# Patient Record
Sex: Female | Born: 1965 | Race: White | Hispanic: No | Marital: Married | Smoking: Never smoker
Health system: Southern US, Community
[De-identification: ages and names within clinical notes are randomized; demographics above are authoritative.]

## PROBLEM LIST (undated history)

## (undated) DIAGNOSIS — F322 Major depressive disorder, single episode, severe without psychotic features: Secondary | ICD-10-CM

## (undated) DIAGNOSIS — F429 Obsessive-compulsive disorder, unspecified: Secondary | ICD-10-CM

## (undated) DIAGNOSIS — R05 Cough: Secondary | ICD-10-CM

## (undated) DIAGNOSIS — F411 Generalized anxiety disorder: Secondary | ICD-10-CM

## (undated) DIAGNOSIS — N390 Urinary tract infection, site not specified: Secondary | ICD-10-CM

## (undated) HISTORY — DX: Urinary tract infection, site not specified: N39.0

## (undated) HISTORY — DX: Major depressive disorder, single episode, severe without psychotic features: F32.2

## (undated) HISTORY — PX: CYSTOSCOPY: SUR368

## (undated) HISTORY — DX: Cough: R05

## (undated) HISTORY — DX: Obsessive-compulsive disorder, unspecified: F42.9

## (undated) HISTORY — DX: Generalized anxiety disorder: F41.1

---
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
## 1996-03-27 ENCOUNTER — Encounter: Payer: Self-pay | Admitting: Internal Medicine

## 2004-07-23 LAB — HM MAMMOGRAPHY: HM Mammogram: NEGATIVE

## 2007-07-17 ENCOUNTER — Emergency Department (HOSPITAL_COMMUNITY): Admission: EM | Admit: 2007-07-17 | Discharge: 2007-07-17 | Payer: Self-pay | Admitting: Emergency Medicine

## 2007-08-22 ENCOUNTER — Ambulatory Visit: Payer: Self-pay | Admitting: Internal Medicine

## 2007-08-22 ENCOUNTER — Encounter: Payer: Self-pay | Admitting: Physician Assistant

## 2007-08-22 ENCOUNTER — Telehealth: Payer: Self-pay | Admitting: Internal Medicine

## 2007-08-22 DIAGNOSIS — N390 Urinary tract infection, site not specified: Secondary | ICD-10-CM

## 2007-08-22 LAB — CONVERTED CEMR LAB
Crystals: NEGATIVE
Ketones, ur: NEGATIVE mg/dL
Nitrite: NEGATIVE
Specific Gravity, Urine: 1.015 (ref 1.000–1.03)
Total Protein, Urine: NEGATIVE mg/dL
Urine Glucose: NEGATIVE mg/dL

## 2007-09-09 ENCOUNTER — Telehealth: Payer: Self-pay | Admitting: Internal Medicine

## 2007-09-23 ENCOUNTER — Telehealth: Payer: Self-pay | Admitting: Internal Medicine

## 2007-09-23 ENCOUNTER — Ambulatory Visit: Payer: Self-pay | Admitting: Internal Medicine

## 2007-09-25 ENCOUNTER — Encounter: Payer: Self-pay | Admitting: Internal Medicine

## 2007-11-15 ENCOUNTER — Telehealth: Payer: Self-pay | Admitting: Internal Medicine

## 2007-11-21 ENCOUNTER — Telehealth: Payer: Self-pay | Admitting: Internal Medicine

## 2007-11-22 ENCOUNTER — Ambulatory Visit: Payer: Self-pay | Admitting: Internal Medicine

## 2007-11-22 ENCOUNTER — Encounter (INDEPENDENT_AMBULATORY_CARE_PROVIDER_SITE_OTHER): Payer: Self-pay | Admitting: *Deleted

## 2007-11-22 ENCOUNTER — Observation Stay (HOSPITAL_COMMUNITY): Admission: AD | Admit: 2007-11-22 | Discharge: 2007-11-23 | Payer: Self-pay | Admitting: Internal Medicine

## 2007-11-22 DIAGNOSIS — N10 Acute tubulo-interstitial nephritis: Secondary | ICD-10-CM

## 2007-11-24 ENCOUNTER — Telehealth: Payer: Self-pay | Admitting: Internal Medicine

## 2007-12-13 ENCOUNTER — Ambulatory Visit: Payer: Self-pay | Admitting: Internal Medicine

## 2007-12-13 DIAGNOSIS — N3030 Trigonitis without hematuria: Secondary | ICD-10-CM

## 2007-12-13 DIAGNOSIS — F329 Major depressive disorder, single episode, unspecified: Secondary | ICD-10-CM

## 2008-06-28 ENCOUNTER — Ambulatory Visit: Payer: Self-pay | Admitting: Internal Medicine

## 2008-06-28 DIAGNOSIS — R229 Localized swelling, mass and lump, unspecified: Secondary | ICD-10-CM | POA: Insufficient documentation

## 2008-09-17 ENCOUNTER — Ambulatory Visit: Payer: Self-pay | Admitting: Internal Medicine

## 2008-09-17 DIAGNOSIS — M542 Cervicalgia: Secondary | ICD-10-CM | POA: Insufficient documentation

## 2008-10-26 ENCOUNTER — Emergency Department (HOSPITAL_COMMUNITY): Admission: EM | Admit: 2008-10-26 | Discharge: 2008-10-27 | Payer: Self-pay | Admitting: Emergency Medicine

## 2008-10-27 ENCOUNTER — Encounter (INDEPENDENT_AMBULATORY_CARE_PROVIDER_SITE_OTHER): Payer: Self-pay | Admitting: *Deleted

## 2008-12-20 ENCOUNTER — Ambulatory Visit: Payer: Self-pay | Admitting: Internal Medicine

## 2009-03-08 ENCOUNTER — Ambulatory Visit (HOSPITAL_COMMUNITY): Admission: RE | Admit: 2009-03-08 | Discharge: 2009-03-08 | Payer: Self-pay | Admitting: Orthopedic Surgery

## 2009-06-28 ENCOUNTER — Telehealth: Payer: Self-pay | Admitting: Internal Medicine

## 2010-01-30 ENCOUNTER — Telehealth: Payer: Self-pay | Admitting: Gastroenterology

## 2010-02-27 ENCOUNTER — Encounter: Payer: Self-pay | Admitting: Internal Medicine

## 2010-02-27 ENCOUNTER — Ambulatory Visit
Admission: RE | Admit: 2010-02-27 | Discharge: 2010-02-27 | Payer: Self-pay | Source: Home / Self Care | Attending: Internal Medicine | Admitting: Internal Medicine

## 2010-02-27 DIAGNOSIS — R1032 Left lower quadrant pain: Secondary | ICD-10-CM | POA: Insufficient documentation

## 2010-02-28 ENCOUNTER — Encounter: Payer: Self-pay | Admitting: Internal Medicine

## 2010-02-28 ENCOUNTER — Ambulatory Visit
Admission: RE | Admit: 2010-02-28 | Discharge: 2010-02-28 | Payer: Self-pay | Source: Home / Self Care | Attending: Internal Medicine | Admitting: Internal Medicine

## 2010-03-07 ENCOUNTER — Encounter: Payer: Self-pay | Admitting: Internal Medicine

## 2010-03-10 ENCOUNTER — Telehealth: Payer: Self-pay | Admitting: Internal Medicine

## 2010-03-18 NOTE — Progress Notes (Signed)
Summary: Diazepam refill  Phone Note Refill Request Message from:  Fax from Pharmacy on Jun 28, 2009 4:28 PM  Refills Requested: Medication #1:  DIAZEPAM 2 MG TABS take 1 or up to 3 by mouth q6 as needed muscle spasm neck   Notes: [facility] [address] Initial call taken by: Lucious Groves,  Jun 28, 2009 4:29 PM  Follow-up for Phone Call        OK to refill x 2. (3 total) Follow-up by: Jacques Navy MD,  Jul 01, 2009 5:02 AM    Prescriptions: DIAZEPAM 2 MG TABS (DIAZEPAM) take 1 or up to 3 by mouth q6 as needed muscle spasm neck  #30 x 2   Entered by:   Bill Salinas CMA   Authorized by:   Jacques Navy MD   Signed by:   Bill Salinas CMA on 07/01/2009   Method used:   Telephoned to ...       [facility] [address] [phone]* (retail)       [address]       [place], [place]  [address]       Ph: [phone] or [phone]       Fax: [phone]   RxID:   [id number]

## 2010-03-20 NOTE — Assessment & Plan Note (Signed)
Summary: per Dr Sherren Kerns   History of Present Illness Visit Type: Initial Visit Primary GI MD: Lina Sar MD Primary Provider: Illene Regulus, MD Requesting Provider: n/a Chief Complaint: Acute constipation and periumbilical abdominal discomfort x 1 month. 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 The pain is localized anteriorly. It is not been dependent on bowel movements or eating. The pain appears shortly after she gets up in the morning. It almost feels like an obstruction. She has taken laxatives but does have remaining tenderness in the left lower quadrant. She was seen around September 2010 at [facility] emergency room and a CT scan of the abdomen at that time showed mild stranding in the left lower quadrant around the sigmoid, but no evidence of diverticulitis. There was also questionable heterogeneous uterus and left adnexal stricture or reflecting likely ovarian cyst or small hydrosalpinx. There was no adenopathy. The rest of the abdomen and pelvis was normal. Her hemoglobin at that time was 12.5 with a normal white cell count.   GI Review of Systems    Reports abdominal pain.     Location of  Abdominal pain: periumbilical.    Denies acid reflux, belching, bloating, chest pain, dysphagia with liquids, dysphagia with solids, heartburn, loss of appetite, nausea, vomiting, vomiting blood, weight loss, and  weight gain.      Reports change in bowel habits, constipation, and  rectal pain.     Denies anal fissure, black tarry stools, diarrhea, diverticulosis, fecal incontinence, heme positive stool, hemorrhoids, irritable bowel syndrome, jaundice, light color stool, liver problems, and  rectal bleeding. Preventive Screening-Counseling & Management  Alcohol-Tobacco     Smoking Status: never  Caffeine-Diet-Exercise     Does Patient  Exercise: no      Drug Use:  no.      Current Medications (verified): 1)  Pristiq 50 Mg Xr24h-Tab (Desvenlafaxine Succinate) .... Take 1 By Mouth Qd 2)  Meloxicam 15 Mg Tabs (Meloxicam) .Marland Kitchen.. 1 By Mouth Once Daily As Needed 3)  Lamictal 100 Mg Tabs (Lamotrigine) .... Take 1/2-1 Tablet Daily As Needed  Allergies (verified): 1)  ! * Chlorhexidine  Past History:  Past Medical History: Reviewed history from 12/13/2007 and no changes required. 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 cystosocopy with uretheral stretches.    G2Ps-NSVD  Family History: father- '45: CAD/PCI w/ stents, lipids mother - '45: Lipids, a. 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 no abuse history Patient has never smoked.  Alcohol Use - yes Illicit Drug Use - no Patient does not get regular exercise.  Does Patient Exercise:  no  Review of Systems  The patient denies allergy/sinus, anemia, anxiety-new, arthritis/joint pain, back pain, blood in urine, breast changes/lumps, change in vision, confusion, cough, coughing up blood, depression-new, fainting, fatigue, fever, headaches-new, hearing problems, heart murmur, heart rhythm changes, itching, menstrual pain, muscle pains/cramps, night sweats, nosebleeds, pregnancy symptoms, shortness of  breath, skin rash, sleeping problems, sore throat, swelling of feet/legs, swollen lymph glands, thirst - excessive , urination - excessive , urination changes/pain, urine  leakage, vision changes, and voice change.         Pertinent positive and negative review of systems were noted in the above HPI. All other ROS was otherwise negative.   Vital Signs:  Patient profile:   45 year old female Height:      65 inches Weight:      126.38 pounds BMI:     21.11 BSA:     1.63 Pulse rate:   84 / minute Pulse rhythm:   regular BP sitting:   108 / 88  (right arm) Cuff size:   regular  Vitals Entered By: Lamona Curl CMA Duncan Dull) (February 27, 2010 11:54 AM)  Physical Exam  General:  Well developed, well nourished, no acute distress. Lungs:  Clear throughout to auscultation. Heart:  Regular rate and rhythm; no murmurs, rubs,  or bruits. Abdomen:  Soft  relaxed abdomen with normal active bowel sounds. Palpable tender fullness in the left middle quadrants without rebound. It could be moved  around,it is not attached to the abdominal wall. Right lower quadrant and middle quadrants were unremarkable. Rectal:  soft Hemoccult negative stool. Extremities:  No clubbing, cyanosis, edema or deformities noted. Skin:  Intact without significant lesions or rashes. Psych:  Alert and cooperative. Normal mood and affect.   Impression & Recommendations:  Problem # 1:  ABDOMINAL PAIN, LEFT LOWER QUADRANT (ICD-789.04)  Patient has left lower quadrant abdominal discomfort associated with abnormal physical findings and questionable abnormality on a prior CT scan from September 2010. I suspect an inflammatory process in the left lower quadrant either arising from the colon or from pelvic structures. She has no history of endometriosis and there are no systemic symptoms of fever, weight loss or decreased energy.  There is no occult blood in the stool although her recent hemoglobin according to her was around 11 gms. We have discussed evaluation of this mass and the possiblity of repeating a CT scan versus barium enema versus flexible sigmoidoscopy. I think the most efficient choice  would be direct visualization by flexible sigmoidoscopy. It should be scheduled as soon as possible. She did decline pain medications. Depending on the results of the sigmoidoscopy, we will make further disposition such as use of antibiotics or further radiographic evaluation.  Orders: Flex with Sedation (Flex w/Sed)  Patient Instructions: 1)  You have been scheduled for a flexible sigmoidoscopy with sedation on 02/28/10 @ 10:30 am. 2)  The medication list was reviewed and reconciled.  All changed / newly prescribed medications were explained.  A complete medication list was provided to the patient / caregiver.

## 2010-03-20 NOTE — Letter (Signed)
Summary: [facility]  [address] [place], [place] [address]   Phone: [phone]  Fax: [phone]       RANAE CASEBIER    1965/11/05    MRN: [id number]        Procedure Day /Date: 02/28/10 Friday     Arrival Time: 9:30 am     Procedure Time: 10:30 am     Location of Procedure:                    _ x_  [facility] (4th Floor)  PREPARATION FOR FLEXIBLE SIGMOIDOSCOPY WITH MAGNESIUM CITRATE  Prior to the day before your procedure, purchase one 8 oz. bottle of Magnesium Citrate and one Fleet Enema from the laxative section of your drugstore.  _________________________________________________________________________________________________  THE DAY BEFORE YOUR PROCEDURE             DATE: 02/27/10     DAY: Thursday  1.   Have a clear liquid dinner the night before your procedure.  2.   Do not drink anything colored red or purple.  Avoid juices with pulp.  No orange juice.              CLEAR LIQUIDS INCLUDE: Water Jello Ice Popsicles Tea (sugar ok, no milk/cream) Powdered fruit flavored drinks Coffee (sugar ok, no milk/cream) Gatorade Juice: apple, white grape, white cranberry  Lemonade Clear bullion, consomm, broth Carbonated beverages (any kind) Strained chicken noodle soup Hard Candy   3.   At 7:00 pm the night before your procedure, drink one bottle of Magnesium Citrate over ice.  4.   Drink at least 3 more glasses of clear liquids before bedtime (preferably juices).  5.   Results are expected usually within 1 to 6 hours after taking the Magnesium Citrate.  ___________________________________________________________________________________________________  THE DAY OF YOUR PROCEDURE            DATE: 02/28/10     DAY: Friday  1.   Use Fleet Enema one hour prior to coming for procedure.  2.   You may drink clear liquids until 8:30 am (2 hours before exam)       MEDICATION INSTRUCTIONS  Unless otherwise  instructed, you should take regular prescription medications with a small sip of water as early as possible the morning of your procedure.        OTHER INSTRUCTIONS  You will need a responsible adult at least 43 years of age to accompany you and drive you home.   This person must remain in the waiting room during your procedure.  Wear loose fitting clothing that is easily removed.  Leave jewelry and other valuables at home.  However, you may wish to bring a book to read or an iPod/MP3 player to listen to music as you wait for your procedure to start.  Remove all body piercing jewelry and leave at home.  Total time from sign-in until discharge is approximately 2-3 hours.  You should go home directly after your procedure and rest.  You can resume normal activities the day after your procedure.  The day of your procedure you should not:   Drive   Make legal decisions   Operate machinery   Drink alcohol   Return to work  You will receive specific instructions about eating, activities and medications before you leave.   The above instructions have been reviewed and explained to me by   _______________________    I fully understand and  can verbalize these instructions _____________________________ Date _________

## 2010-03-20 NOTE — Consult Note (Signed)
Summary: Cystitis  NAME:  Amanda Alvarez, Amanda Alvarez              ACCOUNT NO.:  [id number]      MEDICAL RECORD NO.:  [id number]          PATIENT TYPE:  OBV      LOCATION:  1501                         FACILITY:  [facility]      PHYSICIAN:  Bertram Millard. Dahlstedt, M.D.DATE OF BIRTH:  [birth year]      DATE OF CONSULTATION:  11/22/2007   DATE OF DISCHARGE:                                    CONSULTATION      REASON FOR CONSULTATION:  Painful cystitis.      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  She has a long-standing urologic history,   including urinary tract infections as a young girl.  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  Additionally, 20 years ago, she   had a couple of cystoscopic procedures including urethral dilatation and   what sounds like urethral incision.  She read the operative reports, and   states that she was found to have inflammation of the bladder and   trigonitis.      Over the past few years, she has had a continued burning with urination.   Additionally, she has significant pelvic discomfort after intercourse,   usually 2-3 days after.  She has occasional urinary tract infections as   well.  This feels different, like a brick on my bladder.  Yesterday   she began having pain, that similar sensation of pelvic pressure, about   10 a.m.  She additionally had worsened frequency and dysuria.  She had a   fever to 100.  She denies any upper abdominal or back pain.      She is admitted for medical therapy.  Urologic consultation is urgently   requested.      PAST MEDICAL HISTORY:   1. Significant for recurrent urinary tract infections.   2. Depression.   3. She has had prior cystoscopy x2.      MEDICATIONS:  Triphasil and Paxil.      ALLERGIES:  No known drug allergies.      SOCIAL HISTORY:  She is married.  She is a  nonsmoker.  She drinks   alcohol occasionally.      FAMILY HISTORY:  Significant for kidney stones and coronary artery   disease.      REVIEW OF SYSTEMS:  Significant for the previously mentioned   pelvic/urinary symptoms.  She denies fever, night sweats, or weight   loss.  No skin changes, no change in vision, no sore throat or sinus   problems.  No chest pain, peripheral edema, cough, shortness of breath.   No excessive thirst, no back or joint pain.  Mild depression.  No   headache or dizziness.  No change in lower GI system, no heartburn or   indigestion.      PHYSICAL EXAMINATION:  GENERAL:  Pleasant adult female.  She was   sleeping in her room.   VITAL SIGNS:  Blood pressure 111/67, pulse 83, respiratory rate 20,   temperature 98.3.   NECK:  Supple.   HEART:  Normal rate and rhythm.  No rubs, gallops, or murmurs.   LUNGS:  Clear bilaterally.   ABDOMEN:  Flat, soft, nontender.  Mild suprapubic tenderness.  There is   no CVA tenderness, mass, or megaly.   GENITALIA:  Vaginal exam normal - no labial edema.  Urethral meatus   normal.  No discharge.   EXTREMITIES:  Without cyanosis, clubbing, or edema.      LABORATORY DATA:  Laboratories were reviewed - basically all normal with   a white count of 5500.  Pregnancy test negative.  Urinalysis nitrite   positive, trace LE, 7-10 red cells, 0-2 white cells.  Renal ultrasound   negative.      IMPRESSION:   1. Possible acute cystitis.  She is being treated appropriately.   2. Pelvic pain/bladder pain.  Perhaps she has interstitial cystitis.      PLAN:   1. I would feel comfortable switching her to oral antibiotics - she       has received 2 g of Rocephin.  I think that would cover her       initially.   2. Would treat pelvic/bladder pain with narcotics in the short term.   3. I think that this lady could well be treated as an outpatient, and       I would recommend follow-up in my office in the near future.       Perhaps we can  discuss environmental issues which may be       influencing her symptoms.               Bertram Millard. Dahlstedt, M.D.   Electronically Signed            SMD/MEDQ  D:  11/22/2007  T:  11/22/2007  Job:  [id number]      cc:   Rosalyn Gess. Norins, MD   [address]   [place]   [place] [address]

## 2010-03-20 NOTE — Progress Notes (Signed)
Summary: Call Report  Phone Note Other Incoming   Caller: [facility] Summary of Call: [facility] Triage Call Report Triage Record Num: [id number] Operator: Alphonsa Overall Patient Name: Amanda Alvarez Call Date & Time: 03/07/2010 8:59:52PM Patient Phone: [phone] PCP: Illene Regulus Patient Gender: Female PCP Fax : [phone] Patient DOB: 1965-08-31 Practice Name: [facility] Reason for Call: Gunnar Fusi calling about script from [facility] 03/07/10 at 1800. Mupirocin ointment not at pharmacy. [facility] [employer] [phone]. Dr Fabian Sharp read Mupirocin ointment 2% apply to left nostril BID for infection 10gm per Dr Debby Bud order. Called into [facility] and [place] aware. Protocol(s) Used: Medication Question Calls, No Triage (Adults) Recommended Outcome per Protocol: Call Provider Immediately Reason for Outcome: [1] Urgent new prescription or refill (likelihood of harm to patient if not taken) AND [2] triager unable to fill per unit policy Care Advice:  ~ 01/ Initial call taken by: Margaret Pyle, CMA,  March 10, 2010 8:03 AM  Follow-up for Phone Call        k

## 2010-03-20 NOTE — Procedures (Signed)
Summary: Flexible Sigmoidoscopy  Patient: Amanda Alvarez Note: All result statuses are Final unless otherwise noted.  Tests: (1) Flexible Sigmoidoscopy (FLX)  FLX Flexible Sigmoidoscopy                             DONE     [facility]     [address]     [place], [place]  [address]           FLEXIBLE SIGMOIDOSCOPY PROCEDURE REPORT           PATIENT:  Amanda Alvarez, Amanda Alvarez  MR#:  [id number]     BIRTHDATE:  09/03/1965, 44 yrs. old  GENDER:  female           ENDOSCOPIST:  Hedwig Morton. Juanda Chance, MD     Referred by:           PROCEDURE DATE:  02/28/2010     PROCEDURE:  Flexible Sigmoidoscopy, diagnostic     ASA CLASS:  Class I     INDICATIONS:  abdominal pain LLQ abd, pain, "mass" on exam,     abnormal CT scan 10/2008,           MEDICATIONS:   Versed 5 mg, Fentanyl 50 mcg           DESCRIPTION OF PROCEDURE:   After the risks benefits and     alternatives of the procedure were thoroughly explained, informed     consent was obtained.  Digital rectal exam was performed and     revealed no rectal masses.   The [id number] endoscope     was introduced through the anus and advanced to the splenic     flexure, without limitations.  The quality of the prep was     excellent.  The instrument was then slowly withdrawn as the mucosa     was fully examined.     <<PROCEDUREIMAGES>>           The area of colon examined was normal in appearance (see image1,     image2, image3, image4, and image5). no evidence of stricture,     normal appearing colon mucosa, no diverticuli, lumen wide open to     splenic flexure, no evidence of extrinsic compression     Retroflexed views in the rectum revealed small hemorrhoids.    The     scope was then withdrawn from the patient and the procedure     terminated.           COMPLICATIONS:  None           ENDOSCOPIC IMPRESSION:     1) Normal colon     2) Small hemorrhoids     exam to 90 cm ( splenic flexure),     on reexamination prior to the  exam thr "mass" in LLQ was gone.     RECOMMENDATIONS:     constipation? would add a stool softener or fiber supplement to     improve transit time, no further GI eval at this time,           REPEAT EXAM:  In 0 year(s) for.           ______________________________     Hedwig Morton. Juanda Chance, MD           CC:           n.     eSIGNED:   Hedwig Morton. Jaecob Lowden at 02/28/2010 11:39 AM  Amanda Alvarez, Amanda Alvarez, [id number]  Note: An exclamation mark (!) indicates a result that was not dispersed into the flowsheet. Document Creation Date: 02/28/2010 11:39 AM _______________________________________________________________________  (1) Order result status: Final Collection or observation date-time: 02/28/2010 11:25 Requested date-time:  Receipt date-time:  Reported date-time:  Referring Physician:   Ordering Physician: Lina Sar [phone]) Specimen Source:  Source: Launa Grill Order Number: [id number] Lab site:

## 2010-03-20 NOTE — Assessment & Plan Note (Signed)
   Primary Care Provider:  Illene Regulus, MD   History of Present Illness: Amanda Alvarez is seen acutely for a sore nose. She reports that over the past 24-36 hrs she has developed some swelling at the left side of her nose alng with mild erythema and marked tenderness. She has had no fever.She has had no injury. She has not had any URI symptoms: no coryza, rhinorrhea, pharyngitis.  Allergies: 1)  ! * Chlorhexidine PMH-FH-SH reviewed-no changes except otherwise noted  Review of Systems  The patient denies fever, weight loss, hoarseness, and dyspnea on exertion.         no epistaxis, no rhinorrhea  Physical Exam  General:  Well-developed,well-nourished,in no acute distress; alert,appropriate and cooperative throughout examination Nose:  left side of nose is pinkish. Nares left- very red mucosa, no specific lesion but exam is limited.   Impression & Recommendations:  Problem # 1:  OTHER DISEASES OF NASAL CAVITY AND SINUSES (ICD-478.19)  based on history and exam suspect infection of the lateral wall of the nostril.  Plan - mupirocin ointment 2% - apply into nares with q-tip two times a day           warm compresses.  Orders: No Charge Patient Arrived (NCPA0) (NCPA0)  Complete Medication List: 1)  Pristiq 50 Mg Xr24h-tab (Desvenlafaxine succinate) .... Take 1 by mouth qd 2)  Meloxicam 15 Mg Tabs (Meloxicam) .Marland Kitchen.. 1 by mouth once daily as needed 3)  Lamictal 100 Mg Tabs (Lamotrigine) .... Take 1/2-1 tablet daily as needed 4)  Mupirocin 2 % Oint (Mupirocin) .Marland Kitchen.. 1 apply to left nares two times a day for infection Prescriptions: MUPIROCIN 2 % OINT (MUPIROCIN) 1 apply to left nares two times a day for infection  #10 g x 1   Entered and Authorized by:   Jacques Navy MD   Signed by:   Jacques Navy MD on 03/08/2010   Method used:   Electronically to        [facility] # [phone]* (retail)       [address]       [place], [place]  [address]       Ph: [phone]    Fax: [phone]   RxID:   [id number]    Orders Added: 1)  No Charge Patient Arrived (NCPA0) [NCPA0]

## 2010-03-20 NOTE — Progress Notes (Signed)
  Phone Note Outgoing Call   Summary of Call: Chiniqua was xmas shopping last night, started to get pruritic. This am clearly has hives (blotchy, itchy) on arms, trunk back.  I had her take one 25mg  benedryl.  I spoke with her PCP, Dr. Debby Bud and he gave advise on tapering steroids which I will call in for her. Initial call taken by: Rachael Fee MD,  January 30, 2010 10:00 AM    New/Updated Medications: PREDNISONE 10 MG  TABS (PREDNISONE) take 4 pills today, then 3 a day for three days, then 2 a day for 2 days, then 1 a day for 6 days Prescriptions: PREDNISONE 10 MG  TABS (PREDNISONE) take 4 pills today, then 3 a day for three days, then 2 a day for 2 days, then 1 a day for 6 days  #30 x 1   Entered and Authorized by:   Rachael Fee MD   Signed by:   Rachael Fee MD on 01/30/2010   Method used:   Electronically to        [facility] # [phone]* (retail)       [address]       [place], [place]  [address]       Ph: [phone]       Fax: [phone]   RxID:   [id number]   Appended Document:  thank you Kathlene November

## 2010-05-02 ENCOUNTER — Encounter: Payer: Self-pay | Admitting: Internal Medicine

## 2010-05-06 NOTE — Miscellaneous (Signed)
Summary: Zofran Rx  Per Mike Gip, PA-C, patient needs zofran prescription. prescription sent. Dottie Nelson-Smith CMA Duncan Dull)  May 02, 2010 12:16 PM  Clinical Lists Changes  Medications: Added new medication of ONDANSETRON HCL 4 MG TABS (ONDANSETRON HCL) Take 1 tablet by mouth every 6 hours as needed for nausea - Signed Rx of ONDANSETRON HCL 4 MG TABS (ONDANSETRON HCL) Take 1 tablet by mouth every 6 hours as needed for nausea;  #25 x 0;  Signed;  Entered by: Lamona Curl CMA (AAMA);  Authorized by: Hart Carwin MD;  Method used: Electronically to [facility] # [phone]*, [address] [place], [place]  [address], Ph: [phone], Fax: [phone]    Prescriptions: ONDANSETRON HCL 4 MG TABS (ONDANSETRON HCL) Take 1 tablet by mouth every 6 hours as needed for nausea  #25 x 0   Entered by:   Lamona Curl CMA (AAMA)   Authorized by:   Hart Carwin MD   Signed by:   Lamona Curl CMA (AAMA) on 05/02/2010   Method used:   Electronically to        [facility] # [phone]* (retail)       [address]       [place], [place]  [address]       Ph: [phone]       Fax: [phone]   RxID:   [id number]

## 2010-05-23 LAB — COMPREHENSIVE METABOLIC PANEL
AST: 16 U/L (ref 0–37)
Albumin: 3.9 g/dL (ref 3.5–5.2)
BUN: 7 mg/dL (ref 6–23)
Calcium: 9 mg/dL (ref 8.4–10.5)
Creatinine, Ser: 0.7 mg/dL (ref 0.4–1.2)
Total Protein: 7.1 g/dL (ref 6.0–8.3)

## 2010-05-23 LAB — URINALYSIS, ROUTINE W REFLEX MICROSCOPIC
Bilirubin Urine: NEGATIVE
Ketones, ur: NEGATIVE mg/dL
Nitrite: NEGATIVE
Protein, ur: NEGATIVE mg/dL
Specific Gravity, Urine: 1.023 (ref 1.005–1.030)
Urobilinogen, UA: 0.2 mg/dL (ref 0.0–1.0)

## 2010-05-23 LAB — DIFFERENTIAL
Basophils Absolute: 0 10*3/uL (ref 0.0–0.1)
Lymphocytes Relative: 29 % (ref 12–46)
Lymphs Abs: 2.1 10*3/uL (ref 0.7–4.0)
Neutro Abs: 4.4 10*3/uL (ref 1.7–7.7)

## 2010-05-23 LAB — URINE MICROSCOPIC-ADD ON

## 2010-05-23 LAB — CBC
HCT: 36.5 % (ref 36.0–46.0)
Hemoglobin: 12.5 g/dL (ref 12.0–15.0)
Platelets: 252 10*3/uL (ref 150–400)
RBC: 4.02 MIL/uL (ref 3.87–5.11)
RDW: 12.9 % (ref 11.5–15.5)
WBC: 7.3 10*3/uL (ref 4.0–10.5)

## 2010-05-30 ENCOUNTER — Telehealth: Payer: Self-pay | Admitting: Internal Medicine

## 2010-05-30 MED ORDER — CYCLOBENZAPRINE HCL 10 MG PO TABS: 10.0000 mg | ORAL_TABLET | Freq: Three times a day (TID) | ORAL | Status: AC | PRN

## 2010-05-30 MED ORDER — NABUMETONE 500 MG PO TABS: 500.0000 mg | ORAL_TABLET | Freq: Two times a day (BID) | ORAL | Status: DC

## 2010-05-30 NOTE — Telephone Encounter (Signed)
Pt would like a work in appt for neck injection today w/any MD or refills of anti-inflammatory & muscle relaxer and she will set up appt w/Dr Norins, either way. Please advise and let her know.

## 2010-05-30 NOTE — Telephone Encounter (Signed)
Per Dr Macario Golds. Medications called in, pt also wants appt with Dr Debby Bud next week. Pt put on for Apt June 03, 2010  5:15pm

## 2010-06-02 ENCOUNTER — Encounter: Payer: Self-pay | Admitting: *Deleted

## 2010-06-03 ENCOUNTER — Ambulatory Visit (INDEPENDENT_AMBULATORY_CARE_PROVIDER_SITE_OTHER): Payer: 59 | Admitting: Internal Medicine

## 2010-06-03 DIAGNOSIS — M546 Pain in thoracic spine: Secondary | ICD-10-CM

## 2010-06-03 DIAGNOSIS — M549 Dorsalgia, unspecified: Secondary | ICD-10-CM

## 2010-06-04 MED ORDER — METHYLPREDNISOLONE ACETATE 80 MG/ML IJ SUSP
40.0000 mg | Freq: Once | INTRAMUSCULAR | Status: AC
  Administered 2010-06-03: 40 mg via INTRAMUSCULAR

## 2010-06-04 NOTE — Patient Instructions (Signed)
n

## 2010-06-04 NOTE — Progress Notes (Signed)
  Subjective:    Patient ID: Amanda Alvarez, female    DOB: 1965/08/22, 45 y.o.   MRN: [id number]  HPI Amanda Alvarez presents for trigger point pain located medial to the right scapula - rhomboids. She has had deep pain and muscle knotting in this location in the past that has responded to trigger point injection.  PMH, FamHx and SocHx reviewed for any changes and relevance.  Review of Systems Review of Systems  Constitutional:  Negative for fever, chills, activity change and unexpected weight change.  HENT:  Negative for hearing loss, ear pain, congestion, neck stiffness and postnasal drip.   Eyes: Negative for pain, discharge and visual disturbance.  Respiratory: Negative for chest tightness and wheezing.   Cardiovascular: Negative for chest pain and palpitations.       [No decreased exercise tolerance Gastrointestinal: [No change in bowel habit. No bloating or gas. No reflux or indigestion Genitourinary: Negative for urgency, frequency, flank pain and difficulty urinating.  Musculoskeletal: Negative for myalgias, back pain, arthralgias and gait problem.  Neurological: Negative for dizziness, tremors, weakness and headaches.  Hematological: Negative for adenopathy.  Psychiatric/Behavioral: Negative for behavioral problems and dysphoric mood.       Objective:   Physical Exam WNWD white woman in no distress Neck - supple with full ROM Back - point tenderness just medial to the scapula right.  Procedure: trigger pont injection Consent - verbal informed consent Location - right back - medial to scapula Prep - betadine and alcohol Meds - .5cc 2% xylocaine with epi Inject - using #23g 1" needle Tolerated well with good results. No complications Routine precautions given.       Assessment & Plan:  1. Localized pain - muscle spasm with pain right back  Plan - trigger point injection that was successful.

## 2010-07-01 NOTE — Consult Note (Signed)
NAMEMELDA, MERMELSTEIN              ACCOUNT NO.:  [id number]   MEDICAL RECORD NO.:  [id number]          PATIENT TYPE:  OBV   LOCATION:  1501                         FACILITY:  [facility]   PHYSICIAN:  Bertram Millard. Dahlstedt, M.D.DATE OF BIRTH:  07-09-1965   DATE OF CONSULTATION:  11/22/2007  DATE OF DISCHARGE:                                 CONSULTATION   REASON FOR CONSULTATION:  Painful cystitis.   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  She has a long-standing urologic history,  including urinary tract infections as a young girl.  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  Additionally, 20 years ago, she  had a couple of cystoscopic procedures including urethral dilatation and  what sounds like urethral incision.  She read the operative reports, and  states that she was found to have inflammation of the bladder and  trigonitis.   Over the past few years, she has had a continued burning with urination.  Additionally, she has significant pelvic discomfort after intercourse,  usually 2-3 days after.  She has occasional urinary tract infections as  well.  This feels different, like a brick on my bladder.  Yesterday  she began having pain, that similar sensation of pelvic pressure, about  10 a.m.  She additionally had worsened frequency and dysuria.  She had a  fever to 100.  She denies any upper abdominal or back pain.   She is admitted for medical therapy.  Urologic consultation is urgently  requested.   PAST MEDICAL HISTORY:  1. Significant for recurrent urinary tract infections.  2. Depression.  3. She has had prior cystoscopy x2.   MEDICATIONS:  Triphasil and Paxil.   ALLERGIES:  No known drug allergies.   SOCIAL HISTORY:  She is married.  She is a nonsmoker.  She drinks  alcohol occasionally.   FAMILY HISTORY:  Significant for  kidney stones and coronary artery  disease.   REVIEW OF SYSTEMS:  Significant for the previously mentioned  pelvic/urinary symptoms.  She denies fever, night sweats, or weight  loss.  No skin changes, no change in vision, no sore throat or sinus  problems.  No chest pain, peripheral edema, cough, shortness of breath.  No excessive thirst, no back or joint pain.  Mild depression.  No  headache or dizziness.  No change in lower GI system, no heartburn or  indigestion.   PHYSICAL EXAMINATION:  GENERAL:  Pleasant adult female.  She was  sleeping in her room.  VITAL SIGNS:  Blood pressure 111/67, pulse 83, respiratory rate 20,  temperature 98.3.  NECK:  Supple.  HEART:  Normal rate and rhythm.  No rubs, gallops, or murmurs.  LUNGS:  Clear bilaterally.  ABDOMEN:  Flat, soft, nontender.  Mild suprapubic tenderness.  There is  no CVA tenderness, mass, or megaly.  GENITALIA:  Vaginal exam normal - no labial edema.  Urethral meatus  normal.  No discharge.  EXTREMITIES:  Without cyanosis, clubbing, or edema.  LABORATORY DATA:  Laboratories were reviewed - basically all normal with  a white count of 5500.  Pregnancy test negative.  Urinalysis nitrite  positive, trace LE, 7-10 red cells, 0-2 white cells.  Renal ultrasound  negative.   IMPRESSION:  1. Possible acute cystitis.  She is being treated appropriately.  2. Pelvic pain/bladder pain.  Perhaps she has interstitial cystitis.   PLAN:  1. I would feel comfortable switching her to oral antibiotics - she      has received 2 g of Rocephin.  I think that would cover her      initially.  2. Would treat pelvic/bladder pain with narcotics in the short term.  3. I think that this lady could well be treated as an outpatient, and      I would recommend follow-up in my office in the near future.      Perhaps we can discuss environmental issues which may be      influencing her symptoms.      Bertram Millard. Dahlstedt, M.D.  Electronically  Signed     SMD/MEDQ  D:  11/22/2007  T:  11/22/2007  Job:  [id number]   cc:   Rosalyn Gess. Norins, MD  [address]  [place]  [place] [address]

## 2010-11-17 LAB — BASIC METABOLIC PANEL
BUN: 7
CO2: 25
Calcium: 8.8
Chloride: 106
Creatinine, Ser: 0.76
GFR calc Af Amer: >60
Glucose, Bld: 131 — ABNORMAL HIGH

## 2010-11-17 LAB — HEPATIC FUNCTION PANEL
Bilirubin, Direct: 0.1
Indirect Bilirubin: 0.6
Total Protein: 6.3

## 2010-11-17 LAB — URINALYSIS, MICROSCOPIC ONLY
Bilirubin Urine: NEGATIVE
Nitrite: POSITIVE — AB

## 2010-11-17 LAB — CULTURE, BLOOD (ROUTINE X 2): Culture: NO GROWTH

## 2010-11-17 LAB — URINE CULTURE

## 2010-11-17 LAB — CBC
MCHC: 33.9
MCV: 91.2
Platelets: 248
RDW: 12.7

## 2010-11-17 LAB — HCG, QUANTITATIVE, PREGNANCY: hCG, Beta Chain, Quant, S: <2

## 2010-11-24 ENCOUNTER — Other Ambulatory Visit (INDEPENDENT_AMBULATORY_CARE_PROVIDER_SITE_OTHER): Payer: 59

## 2010-11-24 ENCOUNTER — Telehealth: Payer: Self-pay | Admitting: *Deleted

## 2010-11-24 DIAGNOSIS — R350 Frequency of micturition: Secondary | ICD-10-CM

## 2010-11-24 LAB — URINALYSIS, ROUTINE W REFLEX MICROSCOPIC
Ketones, ur: NEGATIVE
Leukocytes, UA: NEGATIVE
Nitrite: POSITIVE
Specific Gravity, Urine: 1.015 (ref 1.000–1.030)
Urobilinogen, UA: 2 (ref 0.0–1.0)
pH: 8.5 (ref 5.0–8.0)

## 2010-11-24 MED ORDER — CIPROFLOXACIN HCL 250 MG PO TABS: 250.0000 mg | ORAL_TABLET | Freq: Two times a day (BID) | ORAL | Status: AC

## 2010-11-24 NOTE — Telephone Encounter (Signed)
Pt c/o urinary burning freq and pain. Put in lab order for u/a. Pt informed. Hold open for results.

## 2010-11-24 NOTE — Telephone Encounter (Signed)
Patient informed, Called in RX

## 2010-11-24 NOTE — Telephone Encounter (Signed)
U/a postive. cipro 250 mg bid x 5 days.

## 2010-12-01 ENCOUNTER — Telehealth: Payer: Self-pay | Admitting: *Deleted

## 2010-12-01 DIAGNOSIS — N390 Urinary tract infection, site not specified: Secondary | ICD-10-CM

## 2010-12-01 MED ORDER — NITROFURANTOIN MONOHYD MACRO 100 MG PO CAPS: 100.0000 mg | ORAL_CAPSULE | Freq: Two times a day (BID) | ORAL | Status: AC

## 2010-12-01 NOTE — Telephone Encounter (Signed)
[facility] Triage Call Report Triage Record Num: [id number] Operator: Migdalia Dk Patient Name: Amanda Alvarez Call Date & Time: 11/29/2010 9:01:33PM Patient Phone: [phone] PCP: Illene Regulus Patient Gender: Female PCP Fax : [phone] Patient DOB: [birth year] [facility] Name[facility] Reason for Call: Pt. states has been on Cipro for UTI since 11/25/2010. "I will be taking my last dose tomorrow." Still with pressure in lower abdomen, pain in lower abdomen and flank areas, burning/frequency with urination. Mild nausea. Afebrile. Care advice given per Urinary Symptoms Guideline. Advised to go to ER or UC tonight for evaluation. Pt. voiced understanding. Protocol(s) Used: Urinary Symptoms - Female Recommended Outcome per Protocol: See Provider within 4 hours Reason for Outcome: Urinary tract symptoms AND any flank or low back pain Care Advice: ~ Call provider if symptoms worsen, such as increasing pain in low back, pelvis, or side(s); blood in urine; or fever. Increase intake of fluids. Try to drink 8 oz. (.2 liter) every hour when awake, including unsweetened cranberry juice, unless on restricted fluids for other medical reasons. Take sips of fluid or eat ice chips if nauseated or vomiting. ~ Limit carbonated, alcoholic, and caffeinated beverages such as coffee, tea and soda. Avoid nonprescription cold and allergy medications that contain caffeine. Limit intake of tomatoes, fruit juices (except for unsweetened cranberry juice), dairy products, spicy foods, sugar, and artificial sweeteners (aspartame or saccharine). Stop or decrease smoking. Reducing exposure to bladder irritants may help lessen urgency. ~ Analgesic/Antipyretic Advice - NSAIDs: Consider aspirin, ibuprofen, naproxen or ketoprofen for pain or fever as directed on label or by pharmacist/provider. PRECAUTIONS: - If over 41 years of age, should not take longer than 1 week without consulting  provider. EXCEPTIONS: - Should not be used if taking blood thinners or have bleeding problems. - Do not use if have history of sensitivity/allergy to any of these medications; or history of cardiovascular, ulcer, kidney, liver disease or diabetes unless approved by provider. - Do not exceed recommended dose or frequency. ~ 11/29/2010 9:08:32PM Page 1 of 1 [email or web address]_[email or web address]_[email or web address]

## 2010-12-01 NOTE — Telephone Encounter (Signed)
Pt left vm - she called on call service requesting ABX, they declined to fill. Patient would like additional abx for medication due to recurrent UTI symptoms. Ok? or does pt need repeat u/a & culture?   MD informed, OK for u/a & culture then start macrobid 100 mg bid x 7 days. Labs and RX ordered, Patient informed, she will come by for labs today.

## 2011-04-23 ENCOUNTER — Telehealth: Payer: Self-pay | Admitting: *Deleted

## 2011-04-23 MED ORDER — CIPROFLOXACIN HCL 250 MG PO TABS: 250.0000 mg | ORAL_TABLET | Freq: Two times a day (BID) | ORAL | Status: AC

## 2011-04-23 NOTE — Telephone Encounter (Signed)
K. cipro 2350 mg bid x 5 days.

## 2011-04-23 NOTE — Telephone Encounter (Signed)
Request for Cipro Rx [[facility]], c/o bladder infection. Please advise.

## 2011-04-23 NOTE — Telephone Encounter (Signed)
Pt informed

## 2011-04-24 ENCOUNTER — Other Ambulatory Visit: Payer: Self-pay

## 2011-04-24 MED ORDER — ESOMEPRAZOLE MAGNESIUM 40 MG PO CPDR: 40.0000 mg | DELAYED_RELEASE_CAPSULE | Freq: Every day | ORAL | Status: AC

## 2011-05-13 ENCOUNTER — Telehealth: Payer: Self-pay

## 2011-05-13 DIAGNOSIS — N3000 Acute cystitis without hematuria: Secondary | ICD-10-CM

## 2011-05-13 NOTE — Telephone Encounter (Signed)
k 595.0

## 2011-05-13 NOTE — Telephone Encounter (Signed)
Pt called stating she still believes she has a bladder infection c/o- dysuria and frequency. Pt is requesting order for UA, okay to order?

## 2011-05-14 ENCOUNTER — Other Ambulatory Visit (INDEPENDENT_AMBULATORY_CARE_PROVIDER_SITE_OTHER): Payer: 59

## 2011-05-14 DIAGNOSIS — N3000 Acute cystitis without hematuria: Secondary | ICD-10-CM

## 2011-05-14 LAB — URINALYSIS, ROUTINE W REFLEX MICROSCOPIC
Hgb urine dipstick: NEGATIVE
Ketones, ur: NEGATIVE
Specific Gravity, Urine: >=1.03 (ref 1.000–1.030)
Total Protein, Urine: NEGATIVE
Urine Glucose: NEGATIVE
Urobilinogen, UA: 0.2 (ref 0.0–1.0)

## 2011-05-18 ENCOUNTER — Telehealth: Payer: Self-pay | Admitting: *Deleted

## 2011-05-18 ENCOUNTER — Telehealth: Payer: Self-pay

## 2011-05-18 NOTE — Telephone Encounter (Signed)
Message copied by Regis Bill on Mon May 18, 2011  8:30 AM ------      Message from: Jacques Navy      Created: Sun May 17, 2011  4:54 PM       Call patient - U/a negative

## 2011-05-18 NOTE — Telephone Encounter (Signed)
Patient informed. 

## 2011-05-18 NOTE — Telephone Encounter (Signed)
Pt informed--see 04.01.13 phone note.

## 2011-05-18 NOTE — Telephone Encounter (Signed)
U/A 3/28 was negative. Please call patient Trial of azopyridine.

## 2011-05-18 NOTE — Telephone Encounter (Signed)
[facility] Triage Call Report Triage Record Num: [id number] Operator: Lodema Pilot Patient Name: Amanda Alvarez Call Date & Time: 05/16/2011 6:56:34PM Patient Phone: [phone] PCP: Illene Regulus Patient Gender: Female PCP Fax : [phone] Patient DOB: 07-19-1965 [facility] Reason for Call: Caller: [name]/Patient; PCP: Illene Regulus; CB#: [phone]; Call regarding Bladder Infection; Urine sample dropped off at office 05/15/11. Urinary burning, frequency. Was tx for UTI 3 wks ago. This episode onset x 1 wk. Per Urinary Symptoms - Female Protocol, attempted to check results in Epic, but was unable to open [facility] at present. Advised to see MD w/in 24 hours. 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 Protocol(s) Used: Urinary Symptoms - Female Recommended Outcome per Protocol: See Provider within 24 hours Reason for Outcome: Has one or more urinary tract symptoms AND has not been previously evaluated Care Advice: ~ SYMPTOM / CONDITION MANAGEMENT 05/16/2011 8:09:55PM Page 1 of 1 [email or web address]_[email or web address]_V2

## 2011-08-28 ENCOUNTER — Other Ambulatory Visit: Payer: Self-pay

## 2011-08-28 ENCOUNTER — Telehealth: Payer: Self-pay | Admitting: *Deleted

## 2011-08-28 ENCOUNTER — Telehealth: Payer: Self-pay | Admitting: Internal Medicine

## 2011-08-28 ENCOUNTER — Other Ambulatory Visit (INDEPENDENT_AMBULATORY_CARE_PROVIDER_SITE_OTHER): Payer: 59

## 2011-08-28 DIAGNOSIS — N390 Urinary tract infection, site not specified: Secondary | ICD-10-CM

## 2011-08-28 LAB — URINALYSIS, ROUTINE W REFLEX MICROSCOPIC

## 2011-08-28 MED ORDER — CIPROFLOXACIN HCL 250 MG PO TABS: 250.0000 mg | ORAL_TABLET | Freq: Two times a day (BID) | ORAL | Status: AC

## 2011-08-28 NOTE — Telephone Encounter (Signed)
Pt is in the lab wanting to get a UA done.Having some UTI sxs, pain & burning when urinating. Per Dr. Debby Bud ok to enter due to hx of UTI sxs. Cala Bradford called back and stated did not have to put in she will have someone ather job to put or in... 08/28/11@2 :15pm/LMB

## 2011-08-28 NOTE — Telephone Encounter (Signed)
Caller: Ysabella/Mother; PCP: Illene Regulus; CB#: [phone]; ;  Call regarding Dysuria Pt c/o of Dysuria. She  is requesting an order to do an  urine sample because she is on call at hospital and can not come in for an appt. Pt is at the lab waiting on an order.  Per office they will put order in for pt, pt states she already has an order now from another physician.

## 2011-09-01 ENCOUNTER — Telehealth: Payer: Self-pay | Admitting: *Deleted

## 2011-09-01 NOTE — Telephone Encounter (Signed)
LEFT MESSAGE ON HOME # OF LAB UA RESULT

## 2011-09-01 NOTE — Telephone Encounter (Signed)
Message copied by Elnora Morrison on Tue Sep 01, 2011  3:15 PM ------      Message from: Jacques Navy      Created: Sun Aug 30, 2011  4:41 AM       Call ms Wilmon Pali - U/A negative

## 2011-09-04 ENCOUNTER — Encounter: Payer: Self-pay | Admitting: Internal Medicine

## 2011-09-04 ENCOUNTER — Ambulatory Visit (INDEPENDENT_AMBULATORY_CARE_PROVIDER_SITE_OTHER): Payer: 59 | Admitting: Internal Medicine

## 2011-09-04 VITALS — BP 106/78 | HR 71 | Temp 98.6°F | Resp 16 | Wt 122.0 lb

## 2011-09-04 DIAGNOSIS — N318 Other neuromuscular dysfunction of bladder: Secondary | ICD-10-CM

## 2011-09-04 DIAGNOSIS — N3281 Overactive bladder: Secondary | ICD-10-CM

## 2011-09-04 DIAGNOSIS — N390 Urinary tract infection, site not specified: Secondary | ICD-10-CM

## 2011-09-04 MED ORDER — TRIMETHOPRIM 100 MG PO TABS: 100.0000 mg | ORAL_TABLET | Freq: Every day | ORAL | Status: AC

## 2011-09-04 MED ORDER — OXYBUTYNIN CHLORIDE 5 MG PO TABS: 5.0000 mg | ORAL_TABLET | Freq: Two times a day (BID) | ORAL | Status: DC

## 2011-09-08 DIAGNOSIS — N3281 Overactive bladder: Secondary | ICD-10-CM | POA: Insufficient documentation

## 2011-09-08 NOTE — Assessment & Plan Note (Signed)
Has been tried on a variety of agents. Does well with ditropan.  Plan - oxybutynin 5 mg qHS or bid

## 2011-09-08 NOTE — Progress Notes (Signed)
Subjective:    Patient ID: Amanda Alvarez, female    DOB: [birth year], 45 y.o.   MRN: [id number]  HPI Amanda Alvarez has a life long history of recurrent UTI. By her report she has had multiple evaluations over the years including cystoscopy with biopsy x 3 that consistently reveals inflammation. She has had no upper tract pathology and has no cystometric abnormalities. Due to post-coital infections she does use post-coital antibiotics. She will have sudden on-set UTI symptoms that rapidly progresses to full-blown infection. She has been responsive to usual antiobiotic therapy when infected. At this point she is interested in suppressive therapy.  Past Medical History  Diagnosis Date  . Major depressive disorder, single episode, severe, without mention of psychotic behavior   . Urinary tract infection, site not specified     chronic since childhood. Has had multiple eval: cysto x 3 all with chronic inflammation with lymphocytis invasion with plasma cells. for suppresion   Past Surgical History  Procedure Date  . Cystoscopy     with uretheral stretches   Family History  Problem Relation Age of Onset  . Atrial fibrillation Mother   . Hyperlipidemia Mother   . Coronary artery disease Father   . Hyperlipidemia Father   . Heart disease Father   . Colon polyps Father   . Hypertension Brother   . Diabetes Neg Hx   . Colon cancer Neg Hx   . Breast cancer Neg Hx   . Heart disease Other   . Heart disease Other   . Lung cancer Other   . Cancer Other     breast  . Cancer Paternal Grandfather    History   Social History  . Marital Status: Married    Spouse Name: N/A    Number of Children: 2  . Years of Education: 18   Occupational History  . 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 NURSE PRACTITIONER    Social History Main Topics  . Smoking status: Never Smoker   . Smokeless tobacco: Never Used  . Alcohol Use: 0.5 oz/week    1 drink(s) per week  . Drug Use: No  .  Sexually Active: Yes -- Female partner(s)   Other Topics Concern  . Not on file   Social History Narrative   Cindee Salt, Gala Lewandowsky [place] MSN, [name] Married '94 - 4 years, Divorced;remarried  '021 son - '05; 1 daughter - '03. Regular Exercise -  NO. No Abuse history    Current Outpatient Prescriptions on File Prior to Visit  Medication Sig Dispense Refill  . Armodafinil (NUVIGIL PO) Take 200 mg by mouth daily.      Marland Kitchen desvenlafaxine (PRISTIQ) 50 MG 24 hr tablet Take 50 mg by mouth daily.        Marland Kitchen esomeprazole (NEXIUM) 40 MG capsule Take 1 capsule (40 mg total) by mouth daily.  90 capsule  3  . ciprofloxacin (CIPRO) 250 MG tablet Take 1 tablet (250 mg total) by mouth 2 (two) times daily.  14 tablet  2  . lamoTRIgine (LAMICTAL) 100 MG tablet Take 50-100 mg by mouth daily as needed.        . meloxicam (MOBIC) 15 MG tablet Take 15 mg by mouth daily as needed.        . nabumetone (RELAFEN) 500 MG tablet Take 1 tablet (500 mg total) by mouth 2 (two) times daily.  60 tablet  1  . ondansetron (ZOFRAN) 4 MG tablet Take 4 mg by  mouth every 6 (six) hours as needed.        Marland Kitchen oxybutynin (DITROPAN) 5 MG tablet Take 1 tablet (5 mg total) by mouth 2 (two) times daily.  60 tablet  5      Review of Systems System review is negative for any constitutional, cardiac, pulmonary, GI or neuro symptoms or complaints other than as described in the HPI.     Objective:   Physical Exam Filed Vitals:   09/04/11 1016  BP: 106/78  Pulse: 71  Temp: 98.6 F (37 C)  Resp: 16   Gen'l- WNWD attractive white woman in no distress HEENT-C&S clear Cor- 2+ radial pulse Pulm - normal respirations.       Assessment & Plan:

## 2011-09-08 NOTE — Assessment & Plan Note (Signed)
Chronic UTI with full evaluation in the past. She does not want to go through repeat evaluation since her symptoms have not changed.  Plan Suppressive therapy with trimethoprim 100 mg once a day

## 2012-05-12 ENCOUNTER — Other Ambulatory Visit: Payer: Self-pay | Admitting: Internal Medicine

## 2012-05-12 MED ORDER — HYDROCORTISONE ACE-PRAMOXINE 2.5-1 % RE CREA: TOPICAL_CREAM | Freq: Three times a day (TID) | RECTAL | Status: DC

## 2012-10-14 ENCOUNTER — Other Ambulatory Visit: Payer: Self-pay | Admitting: Internal Medicine

## 2012-10-14 MED ORDER — CIPROFLOXACIN HCL 250 MG PO TABS: 250.0000 mg | ORAL_TABLET | Freq: Two times a day (BID) | ORAL | Status: DC

## 2013-03-14 ENCOUNTER — Encounter: Payer: Self-pay | Admitting: Internal Medicine

## 2013-03-14 ENCOUNTER — Encounter (HOSPITAL_COMMUNITY): Payer: Self-pay | Admitting: Emergency Medicine

## 2013-03-14 ENCOUNTER — Ambulatory Visit (INDEPENDENT_AMBULATORY_CARE_PROVIDER_SITE_OTHER)
Admission: RE | Admit: 2013-03-14 | Discharge: 2013-03-14 | Disposition: A | Discharge status: Home / Self Care | Payer: 59 | Source: Ambulatory Visit | Attending: Internal Medicine | Admitting: Internal Medicine

## 2013-03-14 ENCOUNTER — Emergency Department (HOSPITAL_COMMUNITY)
Admission: EM | Admit: 2013-03-14 | Discharge: 2013-03-15 | Disposition: A | Discharge status: Home / Self Care | Payer: 59 | Attending: Emergency Medicine | Admitting: Emergency Medicine

## 2013-03-14 ENCOUNTER — Other Ambulatory Visit (INDEPENDENT_AMBULATORY_CARE_PROVIDER_SITE_OTHER): Payer: 59

## 2013-03-14 ENCOUNTER — Ambulatory Visit (INDEPENDENT_AMBULATORY_CARE_PROVIDER_SITE_OTHER): Payer: Self-pay | Admitting: Internal Medicine

## 2013-03-14 VITALS — BP 100/60 | HR 116 | Ht 65.0 in

## 2013-03-14 DIAGNOSIS — R55 Syncope and collapse: Secondary | ICD-10-CM | POA: Insufficient documentation

## 2013-03-14 DIAGNOSIS — F322 Major depressive disorder, single episode, severe without psychotic features: Secondary | ICD-10-CM | POA: Insufficient documentation

## 2013-03-14 DIAGNOSIS — Z87440 Personal history of urinary (tract) infections: Secondary | ICD-10-CM | POA: Insufficient documentation

## 2013-03-14 DIAGNOSIS — K5900 Constipation, unspecified: Secondary | ICD-10-CM

## 2013-03-14 DIAGNOSIS — R109 Unspecified abdominal pain: Secondary | ICD-10-CM

## 2013-03-14 DIAGNOSIS — R112 Nausea with vomiting, unspecified: Secondary | ICD-10-CM | POA: Insufficient documentation

## 2013-03-14 DIAGNOSIS — R5381 Other malaise: Secondary | ICD-10-CM | POA: Insufficient documentation

## 2013-03-14 DIAGNOSIS — R509 Fever, unspecified: Secondary | ICD-10-CM | POA: Insufficient documentation

## 2013-03-14 DIAGNOSIS — Z79899 Other long term (current) drug therapy: Secondary | ICD-10-CM | POA: Insufficient documentation

## 2013-03-14 DIAGNOSIS — R000 Tachycardia, unspecified: Secondary | ICD-10-CM | POA: Insufficient documentation

## 2013-03-14 DIAGNOSIS — R5383 Other fatigue: Secondary | ICD-10-CM

## 2013-03-14 LAB — CBC WITH DIFFERENTIAL/PLATELET
BASOS PCT: 0 % (ref 0.0–3.0)
Basophils Absolute: 0 10*3/uL (ref 0.0–0.1)
EOS PCT: 0 % (ref 0.0–5.0)
Eosinophils Absolute: 0 10*3/uL (ref 0.0–0.7)
HCT: 38.5 % (ref 36.0–46.0)
Hemoglobin: 13.1 g/dL (ref 12.0–15.0)
LYMPHS PCT: 2.8 % — AB (ref 12.0–46.0)
Lymphs Abs: 0.3 10*3/uL — ABNORMAL LOW (ref 0.7–4.0)
MCHC: 34 g/dL (ref 30.0–36.0)
MCV: 86.6 fl (ref 78.0–100.0)
Monocytes Absolute: 0.1 10*3/uL (ref 0.1–1.0)
Monocytes Relative: 1.3 % — ABNORMAL LOW (ref 3.0–12.0)
NEUTROS PCT: 95.9 % — AB (ref 43.0–77.0)
Neutro Abs: 10.3 10*3/uL — ABNORMAL HIGH (ref 1.4–7.7)
Platelets: 269 10*3/uL (ref 150.0–400.0)
RBC: 4.45 Mil/uL (ref 3.87–5.11)
RDW: 13.8 % (ref 11.5–14.6)
WBC: 10.8 10*3/uL — AB (ref 4.5–10.5)

## 2013-03-14 LAB — COMPREHENSIVE METABOLIC PANEL
ALBUMIN: 4.3 g/dL (ref 3.5–5.2)
ALT: 11 U/L (ref 0–35)
AST: 15 U/L (ref 0–37)
Alkaline Phosphatase: 53 U/L (ref 39–117)
BUN: 11 mg/dL (ref 6–23)
CALCIUM: 8.9 mg/dL (ref 8.4–10.5)
CHLORIDE: 102 meq/L (ref 96–112)
CO2: 25 mEq/L (ref 19–32)
Creatinine, Ser: 0.7 mg/dL (ref 0.4–1.2)
GFR: 91.26 mL/min (ref >60.00)
GLUCOSE: 119 mg/dL — AB (ref 70–99)
POTASSIUM: 4.2 meq/L (ref 3.5–5.1)
Sodium: 136 mEq/L (ref 135–145)
TOTAL PROTEIN: 7.7 g/dL (ref 6.0–8.3)
Total Bilirubin: 1 mg/dL (ref 0.3–1.2)

## 2013-03-14 MED ORDER — ONDANSETRON HCL 4 MG PO TABS: 4.0000 mg | ORAL_TABLET | Freq: Three times a day (TID) | ORAL | Status: DC | PRN

## 2013-03-14 MED ORDER — ACETAMINOPHEN 325 MG PO TABS
650.0000 mg | ORAL_TABLET | Freq: Four times a day (QID) | ORAL | Status: DC | PRN
  Administered 2013-03-14: 650 mg via ORAL
  Filled 2013-03-14: qty 2

## 2013-03-14 NOTE — ED Notes (Signed)
Pt reports having constipation for 2 weeks which she was treating with MOM then later Mag Citrate. Not much response was seen from the medications. Pt states that around 2 this morning she has a syncopal episode and remembers waking up in vomit and could not breath. Pt was seen by dr today and they reports a left shift. Pt denies any ab pain but states that she soes feel queezy still.

## 2013-03-14 NOTE — Patient Instructions (Signed)
We have sent the following medications to your pharmacy for you to pick up at your convenience: [facility] has requested that you go to the basement for the following lab work before leaving today: CMET, CBC  Please go to the basement floor before leaving today for a KUB.  CC: Dr Adella Hare

## 2013-03-14 NOTE — Progress Notes (Signed)
Amanda Alvarez 11-19-1965 [id number]  Note: This dictation was prepared with Dragon digital system. Any transcriptional errors that result from this procedure are unintentional.   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 She has had acute abdominal pain which started 6 hours ago when she awoke up at 2 in the morning and felt sick. She passed out on the floor and woke up with a large amount of vomitus in her mouth and in her nose and subsequently developed rather severe diarrhea. She has had chronic constipation, more so in the last 2 weeks. She takes stool softeners, milk of magnesia and she took  a half a bottle of magnesium citrate yesterday. She is feeling weak and somewhat nauseated.now. There was no rectal bleeding or fever. A sigmoidoscopy 3 years ago for constipation and abnormal mass affect in the left lower quadrant, was normal. She has taken laxatives on a frequent bases. A CT scan of the abdomen in 2010 showed some stranding along the sigmoid colon but no evidence of colitis or diverticulitis.    Past Medical History  Diagnosis Date  . Major depressive disorder, single episode, severe, without mention of psychotic behavior   . Urinary tract infection, site not specified     chronic since childhood. Has had multiple eval: cysto x 3 all with chronic inflammation with lymphocytis invasion with plasma cells. for suppresion    Past Surgical History  Procedure Laterality Date  . Cystoscopy      with uretheral stretches    No Known Allergies  Family history and social history have been reviewed.  Review of Systems:   The remainder of the 10 point ROS is negative except as outlined in the H&P  Physical Exam: General Appearance Well developed, in no distress, appears tired Eyes  Non icteric  HEENT  Non traumatic, normocephalic  Mouth No lesion, tongue papillated, no cheilosis Neck Supple without adenopathy,  thyroid not enlarged, no carotid bruits, no JVD Lungs Clear to auscultation bilaterally COR Normal S1, normal S2, rapid regular rhythm, no murmur, quiet precordium Abdomen soft diffusely tender more so in epigastrium. Quiet bowel sounds. Tympany in upper abdomen. No palpable mass or rebound Rectal small amount of Hemoccult negative stool Extremities  No pedal edema Skin No lesions Neurological Alert and oriented x 3 Psychological Normal mood and affect  Assessment and Plan:   Problem #61 48 year old white female with a syncopal episode as a result of a vagal reaction and possibly bowel distention due to laxatives. She has now had good results from the laxative but is stll having crampy abdominal pain and still feels very weak. She has a history of constipation. Her abdominal exam is benign. We will obtain a CBC and metabolic panel to rule out electrolyte abnormalities. We will obtain a KUB to assess her bowel distention. She will go home and hydrate orally, unless her potassium is so low that she will need IV potassium. We will send Zofran 4 mg when necessary for nausea. She will stay on liquids today and we will make further disposition at a later time. She will need a more regular bowel regimen to prevent acute attacks. Screening colonoscopy at age 21 or earlier if bowel problems continue    Delfin Edis 03/14/2013

## 2013-03-15 ENCOUNTER — Emergency Department (HOSPITAL_COMMUNITY): Payer: 59

## 2013-03-15 LAB — CBC WITH DIFFERENTIAL/PLATELET
BASOS ABS: 0 10*3/uL (ref 0.0–0.1)
Basophils Relative: 0 % (ref 0–1)
Eosinophils Absolute: 0 10*3/uL (ref 0.0–0.7)
Eosinophils Relative: 0 % (ref 0–5)
HEMATOCRIT: 35.6 % — AB (ref 36.0–46.0)
HEMOGLOBIN: 12.4 g/dL (ref 12.0–15.0)
LYMPHS PCT: 18 % (ref 12–46)
Lymphs Abs: 1 10*3/uL (ref 0.7–4.0)
MCH: 30 pg (ref 26.0–34.0)
MCHC: 34.8 g/dL (ref 30.0–36.0)
MCV: 86.2 fL (ref 78.0–100.0)
MONO ABS: 0.5 10*3/uL (ref 0.1–1.0)
Monocytes Relative: 9 % (ref 3–12)
NEUTROS ABS: 4 10*3/uL (ref 1.7–7.7)
Neutrophils Relative %: 73 % (ref 43–77)
Platelets: 226 10*3/uL (ref 150–400)
RBC: 4.13 MIL/uL (ref 3.87–5.11)
RDW: 13 % (ref 11.5–15.5)
WBC: 5.6 10*3/uL (ref 4.0–10.5)

## 2013-03-15 LAB — URINALYSIS, ROUTINE W REFLEX MICROSCOPIC
Bilirubin Urine: NEGATIVE
GLUCOSE, UA: NEGATIVE mg/dL
Hgb urine dipstick: NEGATIVE
Ketones, ur: NEGATIVE mg/dL
Nitrite: NEGATIVE
Protein, ur: NEGATIVE mg/dL
Specific Gravity, Urine: 1.018 (ref 1.005–1.030)
UROBILINOGEN UA: 0.2 mg/dL (ref 0.0–1.0)
pH: 6 (ref 5.0–8.0)

## 2013-03-15 LAB — MAGNESIUM: Magnesium: 2.4 mg/dL (ref 1.5–2.5)

## 2013-03-15 LAB — URINE MICROSCOPIC-ADD ON

## 2013-03-15 LAB — COMPREHENSIVE METABOLIC PANEL
ALBUMIN: 3.8 g/dL (ref 3.5–5.2)
ALK PHOS: 57 U/L (ref 39–117)
ALT: 9 U/L (ref 0–35)
AST: 18 U/L (ref 0–37)
BUN: 11 mg/dL (ref 6–23)
CALCIUM: 8.6 mg/dL (ref 8.4–10.5)
CO2: 20 mEq/L (ref 19–32)
Chloride: 97 mEq/L (ref 96–112)
Creatinine, Ser: 0.63 mg/dL (ref 0.50–1.10)
GFR calc Af Amer: >90 mL/min (ref >90)
GFR calc non Af Amer: >90 mL/min (ref >90)
Glucose, Bld: 101 mg/dL — ABNORMAL HIGH (ref 70–99)
POTASSIUM: 3.7 meq/L (ref 3.7–5.3)
SODIUM: 133 meq/L — AB (ref 137–147)
TOTAL PROTEIN: 7.3 g/dL (ref 6.0–8.3)
Total Bilirubin: 0.5 mg/dL (ref 0.3–1.2)

## 2013-03-15 MED ORDER — SODIUM CHLORIDE 0.9 % IV BOLUS (SEPSIS)
1000.0000 mL | Freq: Once | INTRAVENOUS | Status: AC
  Administered 2013-03-15: 1000 mL via INTRAVENOUS

## 2013-03-15 MED ORDER — ONDANSETRON HCL 4 MG/2ML IJ SOLN
4.0000 mg | Freq: Once | INTRAMUSCULAR | Status: AC
  Administered 2013-03-15: 4 mg via INTRAVENOUS
  Filled 2013-03-15: qty 2

## 2013-03-15 NOTE — ED Provider Notes (Signed)
CSN: [id number]     Arrival date & time 03/14/13  2201 History   First MD Initiated Contact with Patient 03/15/13 0015     Chief Complaint  Patient presents with  . Fever  . Emesis   HPI  History provided by the patient. Patient is a 48 year old female with history of depression who presents with complaints of weakness and fever. Patient states that she had been feeling constipated for the past 2 weeks. She was self treating herself with milk of magnesia, Colace and more recently bottle of mag citrate. Yesterday night shortly after taking the mag citrate she began to feel very nauseous and lightheaded. She stood up to go towards the bathroom but had a syncopal episode and fell between her bed and the bathroom. She reports waking up in a pile of emesis with vomit in her nasal passageways. She was able to gather herself and get to the bathroom. She did later have a large bowel movement. Earlier this morning she continued to feel very weak and queasy. 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 She reports having some laboratory tests which do not show any significant findings. She went home and has been laying down and drinking water most of the day. This evening she began to feel very hot and had a fever of 102 at home. She did not take any medications for her fever. She feels that she continues to be somewhat dehydrated. She denies having any additional episodes of vomiting or diarrhea. Denies any cough, chest pain or shortness of breath. Denies any urinary symptoms.    Past Medical History  Diagnosis Date  . Major depressive disorder, single episode, severe, without mention of psychotic behavior   . Urinary tract infection, site not specified     chronic since childhood. Has had multiple eval: cysto x 3 all with chronic inflammation with lymphocytis invasion with plasma cells. for suppresion   Past Surgical History  Procedure Laterality Date   . Cystoscopy      with uretheral stretches   Family History  Problem Relation Age of Onset  . Atrial fibrillation Mother   . Hyperlipidemia Mother   . Coronary artery disease Father   . Hyperlipidemia Father   . Heart disease Father   . Colon polyps Father   . Hypertension Brother   . Diabetes Neg Hx   . Colon cancer Neg Hx   . Breast cancer Neg Hx   . Heart disease Other   . Heart disease Other   . Lung cancer Other   . Cancer Other     breast  . Cancer Paternal Grandfather    History  Substance Use Topics  . Smoking status: Never Smoker   . Smokeless tobacco: Never Used  . Alcohol Use: 0.5 oz/week    1 drink(s) per week   OB History   Grav Para Term Preterm Abortions TAB SAB Ect Mult Living   2 2             Obstetric Comments   NSVD     Review of Systems  Constitutional: Positive for fever, chills and fatigue.  Respiratory: Negative for cough and shortness of breath.   Cardiovascular: Negative for chest pain.  Gastrointestinal: Positive for vomiting. Negative for abdominal pain.  Genitourinary: Negative for dysuria, frequency, hematuria and flank pain.  Neurological: Positive for syncope and weakness. Negative for headaches.  All other systems reviewed and are negative.  Allergies  Review of patient's allergies indicates no known allergies.  Home Medications   Current Outpatient Rx  Name  Route  Sig  Dispense  Refill  . Armodafinil (NUVIGIL) 150 MG tablet   Oral   Take 150 mg by mouth daily.         Marland Kitchen desvenlafaxine (PRISTIQ) 50 MG 24 hr tablet   Oral   Take 50 mg by mouth daily.           Marland Kitchen ibuprofen (ADVIL,MOTRIN) 200 MG tablet   Oral   Take 400 mg by mouth every 6 (six) hours as needed (PAIN).         . magnesium citrate SOLN   Oral   Take 0.5 Bottles by mouth once.         . ondansetron (ZOFRAN) 4 MG tablet   Oral   Take 1 tablet (4 mg total) by mouth every 8 (eight) hours as needed.   20 tablet   0    BP 117/64  Pulse  101  Temp(Src) 98.3 F (36.8 C) (Oral)  Resp 16  SpO2 98%  LMP 02/24/2013 Physical Exam  Nursing note and vitals reviewed. Constitutional: She is oriented to person, place, and time. She appears well-developed and well-nourished. No distress.  HENT:  Head: Normocephalic.  Mouth/Throat: Oropharynx is clear and moist.  Cardiovascular: Regular rhythm.  Tachycardia present.   Pulmonary/Chest: Effort normal and breath sounds normal. No respiratory distress. She has no wheezes. She has no rales.  Abdominal: Soft. She exhibits no distension. There is no tenderness. There is no rebound and no guarding.  Mild diffuse discomfort.  Musculoskeletal: Normal range of motion.  Neurological: She is alert and oriented to person, place, and time.  Skin: Skin is warm and dry. No rash noted.  Psychiatric: She has a normal mood and affect. Her behavior is normal.    ED Course  Procedures   DIAGNOSTIC STUDIES: Oxygen Saturation is 98% on room air.    COORDINATION OF CARE:  Nursing notes reviewed. Vital signs reviewed. Initial pt interview and examination performed.   1:52 AM-patient seen and evaluated. She does not appear in acute distress. Does not appear in some mild discomfort. Discussed work up plan with pt at bedside, which includes laboratory testing and chest x-ray. Pt agrees with plan.  2:40 AM patient reports having improvement of symptoms after IV fluids and Zofran. She states she is feeling much better. Heart rate now normal. Lab testing without any significant findings. No signs of aspiration pneumonia on chest x-ray. Lungs are clear. O2 sats normal.   Treatment plan initiated: Medications  acetaminophen (TYLENOL) tablet 650 mg (650 mg Oral Given 03/14/13 2240)  sodium chloride 0.9 % bolus 1,000 mL (1,000 mLs Intravenous New Bag/Given 03/15/13 0057)  ondansetron (ZOFRAN) injection 4 mg (4 mg Intravenous Given 03/15/13 0059)   Results for orders placed during the hospital encounter of  03/14/13  COMPREHENSIVE METABOLIC PANEL      Result Value Range   Sodium 133 (*) 137 - 147 mEq/L   Potassium 3.7  3.7 - 5.3 mEq/L   Chloride 97  96 - 112 mEq/L   CO2 20  19 - 32 mEq/L   Glucose, Bld 101 (*) 70 - 99 mg/dL   BUN 11  6 - 23 mg/dL   Creatinine, Ser 0.63  0.50 - 1.10 mg/dL   Calcium 8.6  8.4 - 10.5 mg/dL   Total Protein 7.3  6.0 - 8.3 g/dL   Albumin 3.8  3.5 - 5.2 g/dL   AST 18  0 - 37 U/L   ALT 9  0 - 35 U/L   Alkaline Phosphatase 57  39 - 117 U/L   Total Bilirubin 0.5  0.3 - 1.2 mg/dL   GFR calc non Af Amer >90  >90 mL/min   GFR calc Af Amer >90  >90 mL/min  CBC WITH DIFFERENTIAL      Result Value Range   WBC 5.6  4.0 - 10.5 K/uL   RBC 4.13  3.87 - 5.11 MIL/uL   Hemoglobin 12.4  12.0 - 15.0 g/dL   HCT 35.6 (*) 36.0 - 46.0 %   MCV 86.2  78.0 - 100.0 fL   MCH 30.0  26.0 - 34.0 pg   MCHC 34.8  30.0 - 36.0 g/dL   RDW 13.0  11.5 - 15.5 %   Platelets 226  150 - 400 K/uL   Neutrophils Relative % 73  43 - 77 %   Neutro Abs 4.0  1.7 - 7.7 K/uL   Lymphocytes Relative 18  12 - 46 %   Lymphs Abs 1.0  0.7 - 4.0 K/uL   Monocytes Relative 9  3 - 12 %   Monocytes Absolute 0.5  0.1 - 1.0 K/uL   Eosinophils Relative 0  0 - 5 %   Eosinophils Absolute 0.0  0.0 - 0.7 K/uL   Basophils Relative 0  0 - 1 %   Basophils Absolute 0.0  0.0 - 0.1 K/uL  MAGNESIUM      Result Value Range   Magnesium 2.4  1.5 - 2.5 mg/dL      Imaging Review Dg Chest 2 View  03/15/2013   CLINICAL DATA:  Fever and vomiting  EXAM: CHEST  2 VIEW  COMPARISON:  None.  FINDINGS: The heart size and mediastinal contours are within normal limits. Both lungs are clear. The visualized skeletal structures are unremarkable.  IMPRESSION: No active cardiopulmonary disease.   Electronically Signed   By: Daryll Brod M.D.   On: 03/15/2013 01:17   Dg Abd 1 View  03/14/2013   CLINICAL DATA:  Left-sided abdominal pain.  Constipation.  EXAM: ABDOMEN - 1 VIEW  COMPARISON:  CT abdomen and pelvis 10/27/2008.  FINDINGS: The  bowel gas pattern is nonobstructive. There is a large volume of stool in the descending colon. No abnormal abdominal calcification or focal bony abnormality is identified.  IMPRESSION: No acute finding.  Large volume of stool descending colon.   Electronically Signed   By: Inge Rise M.D.   On: 03/14/2013 09:04    Date: 03/15/2013  Rate: 84  Rhythm: normal sinus rhythm  QRS Axis: normal  Intervals: normal  ST/T Wave abnormalities: normal  Conduction Disutrbances:none  Narrative Interpretation: Some artifact present  Old EKG Reviewed: none available     MDM   1. Nausea & vomiting   2. Syncope   3. Constipation   4. Fever        Martie Lee, PA-C 03/15/13 [phone]

## 2013-03-15 NOTE — ED Provider Notes (Signed)
Medical screening examination/treatment/procedure(s) were performed by non-physician practitioner and as supervising physician I was immediately available for consultation/collaboration.     Sira Adsit, MD 03/15/13 0516 

## 2013-03-15 NOTE — Discharge Instructions (Signed)
Your lab testing and x-ray today has not shown any signs for a concerning or emergent cause of your symptoms.  At this time your providers feel that you may return home followup with your primary care provider for continued treatment.     Vasovagal Syncope, Adult Syncope, commonly known as fainting, is a temporary loss of consciousness. It occurs when the blood flow to the brain is reduced. Vasovagal syncope (also called neurocardiogenic syncope) is a fainting spell in which the blood flow to the brain is reduced because of a sudden drop in heart rate and blood pressure. Vasovagal syncope occurs when the brain and the cardiovascular system (blood vessels) do not adequately communicate and respond to each other. This is the most common cause of fainting. It often occurs in response to fear or some other type of emotional or physical stress. The body has a reaction in which the heart starts beating too slowly or the blood vessels expand, reducing blood pressure. This type of fainting spell is generally considered harmless. However, injuries can occur if a person takes a sudden fall during a fainting spell.  CAUSES  Vasovagal syncope occurs when a person's blood pressure and heart rate decrease suddenly, usually in response to a trigger. Many things and situations can trigger an episode. Some of these include:   Pain.   Fear.   The sight of blood or medical procedures, such as blood being drawn from a vein.   Common activities, such as coughing, swallowing, stretching, or going to the bathroom.   Emotional stress.   Prolonged standing, especially in a warm environment.   Lack of sleep or rest.   Prolonged lack of food.   Prolonged lack of fluids.   Recent illness.  The use of certain drugs that affect blood pressure, such as cocaine, alcohol, marijuana, inhalants, and opiates.  SYMPTOMS  Before the fainting episode, you may:   Feel dizzy or light headed.   Become  pale.  Sense that you are going to faint.   Feel like the room is spinning.   Have tunnel vision, only seeing directly in front of you.   Feel sick to your stomach (nauseous).   See spots or slowly lose vision.   Hear ringing in your ears.   Have a headache.   Feel warm and sweaty.   Feel a sensation of pins and needles. During the fainting spell, you will generally be unconscious for no longer than a couple minutes before waking up and returning to normal. If you get up too quickly before your body can recover, you may faint again. Some twitching or jerky movements may occur during the fainting spell.  DIAGNOSIS  Your caregiver will ask about your symptoms, take a medical history, and perform a physical exam. Various tests may be done to rule out other causes of fainting. These may include blood tests and tests to check the heart, such as electrocardiography, echocardiography, and possibly an electrophysiology study. When other causes have been ruled out, a test may be done to check the body's response to changes in position (tilt table test). TREATMENT  Most cases of vasovagal syncope do not require treatment. Your caregiver may recommend ways to avoid fainting triggers and may provide home strategies for preventing fainting. If you must be exposed to a possible trigger, you can drink additional fluids to help reduce your chances of having an episode of vasovagal syncope. If you have warning signs of an oncoming episode, you can respond by positioning  yourself favorably (lying down). If your fainting spells continue, you may be given medicines to prevent fainting. Some medicines may help make you more resistant to repeated episodes of vasovagal syncope. Special exercises or compression stockings may be recommended. In rare cases, the surgical placement of a pacemaker is considered. HOME CARE INSTRUCTIONS   Learn to identify the warning signs of vasovagal syncope.   Sit or lie  down at the first warning sign of a fainting spell. If sitting, put your head down between your legs. If you lie down, swing your legs up in the air to increase blood flow to the brain.   Avoid hot tubs and saunas.  Avoid prolonged standing.  Drink enough fluids to keep your urine clear or pale yellow. Avoid caffeine.  Increase salt in your diet as directed by your caregiver.   If you have to stand for a long time, perform movements such as:   Crossing your legs.   Flexing and stretching your leg muscles.   Squatting.   Moving your legs.   Bending over.   Only take over-the-counter or prescription medicines as directed by your caregiver. Do not suddenly stop any medicines without asking your caregiver first. SEEK MEDICAL CARE IF:   Your fainting spells continue or happen more frequently in spite of treatment.   You lose consciousness for more than a couple minutes.  You have fainting spells during or after exercising or after being startled.   You have new symptoms that occur with the fainting spells, such as:   Shortness of breath.  Chest pain.   Irregular heartbeat.   You have episodes of twitching or jerky movements that last longer than a few seconds.  You have episodes of twitching or jerky movements without obvious fainting. SEEK IMMEDIATE MEDICAL CARE IF:   You have injuries or bleeding after a fainting spell.   You have episodes of twitching or jerky movements that last longer than 5 minutes.   You have more than one spell of twitching or jerky movements before returning to consciousness after fainting. MAKE SURE YOU:   Understand these instructions.  Will watch your condition.  Will get help right away if you are not doing well or get worse. Document Released: 01/20/2012 Document Reviewed: 01/20/2012 [facility] Patient Information 2014 [place], [place].

## 2013-03-16 LAB — URINE CULTURE
Colony Count: NO GROWTH
Culture: NO GROWTH

## 2013-05-05 ENCOUNTER — Telehealth: Payer: Self-pay | Admitting: *Deleted

## 2013-05-05 MED ORDER — LINACLOTIDE 145 MCG PO CAPS: 145.0000 ug | ORAL_CAPSULE | Freq: Every day | ORAL | Status: DC

## 2013-05-05 NOTE — Telephone Encounter (Signed)
Per Dr Nichola Sizer verbal orders, patient should be given a trial of Linzess 145 mcg. I have given her 4 sample boxes of this.

## 2013-07-31 ENCOUNTER — Other Ambulatory Visit: Payer: Self-pay | Admitting: Internal Medicine

## 2013-07-31 MED ORDER — LINACLOTIDE 145 MCG PO CAPS: 145.0000 ug | ORAL_CAPSULE | Freq: Every day | ORAL | Status: DC

## 2013-09-07 ENCOUNTER — Other Ambulatory Visit: Payer: Self-pay | Admitting: Internal Medicine

## 2013-09-07 MED ORDER — CIPROFLOXACIN-HYDROCORTISONE 0.2-1 % OT SUSP: 3.0000 [drp] | Freq: Two times a day (BID) | OTIC | Status: DC

## 2013-09-07 NOTE — Progress Notes (Signed)
Patient reports right ear canal pain after swimming recently Exam erythema and induration of the ear canal on otoscopic exam Diagnosis: Otitis externa Treatment: Ciprofloxacin 0.3 percent and HC otic suspension, to see PCP if not improving or worsening

## 2013-12-18 ENCOUNTER — Encounter (HOSPITAL_COMMUNITY): Payer: Self-pay | Admitting: Emergency Medicine

## 2014-01-22 ENCOUNTER — Other Ambulatory Visit: Payer: 59

## 2014-01-22 ENCOUNTER — Other Ambulatory Visit: Payer: Self-pay | Admitting: Physician Assistant

## 2014-01-22 DIAGNOSIS — R300 Dysuria: Secondary | ICD-10-CM

## 2014-01-22 MED ORDER — PHENAZOPYRIDINE HCL 200 MG PO TABS: 200.0000 mg | ORAL_TABLET | Freq: Three times a day (TID) | ORAL | Status: DC | PRN

## 2014-01-22 MED ORDER — CIPROFLOXACIN HCL 500 MG PO TABS: 500.0000 mg | ORAL_TABLET | Freq: Two times a day (BID) | ORAL | Status: DC

## 2014-01-24 LAB — CULTURE, URINE COMPREHENSIVE
COLONY COUNT: NO GROWTH
ORGANISM ID, BACTERIA: NO GROWTH

## 2014-02-14 ENCOUNTER — Telehealth: Payer: Self-pay | Admitting: *Deleted

## 2014-02-14 MED ORDER — LINACLOTIDE 145 MCG PO CAPS: 145.0000 ug | ORAL_CAPSULE | Freq: Every day | ORAL | Status: DC

## 2014-02-14 NOTE — Telephone Encounter (Signed)
Patient requests refills on Linzess 145. Rx has been sent.

## 2014-02-19 ENCOUNTER — Ambulatory Visit (INDEPENDENT_AMBULATORY_CARE_PROVIDER_SITE_OTHER): Payer: 59 | Admitting: Internal Medicine

## 2014-02-19 ENCOUNTER — Encounter: Payer: Self-pay | Admitting: Internal Medicine

## 2014-02-19 VITALS — BP 132/82 | HR 88 | Temp 98.0°F | Resp 12 | Ht 64.0 in | Wt 128.8 lb

## 2014-02-19 DIAGNOSIS — N390 Urinary tract infection, site not specified: Secondary | ICD-10-CM

## 2014-02-19 DIAGNOSIS — Z0000 Encounter for general adult medical examination without abnormal findings: Secondary | ICD-10-CM

## 2014-02-19 DIAGNOSIS — F32A Depression, unspecified: Secondary | ICD-10-CM

## 2014-02-19 DIAGNOSIS — F329 Major depressive disorder, single episode, unspecified: Secondary | ICD-10-CM

## 2014-02-19 NOTE — Assessment & Plan Note (Signed)
Check basic labs, up to date on shots (via [facility], not in epic), Reminded about mammogram. Colonoscopy at 53.

## 2014-02-19 NOTE — Progress Notes (Signed)
Pre visit review using our clinic review tool, if applicable. No additional management support is needed unless otherwise documented below in the visit note. 

## 2014-02-19 NOTE — Progress Notes (Signed)
   Subjective:    Patient ID: Amanda Alvarez, female    DOB: 05/22/1965, 49 y.o.   MRN: [id number]  HPI The patient is a 49 YO female who comes in today to establish care. She has PMH of recurrent UTI (follows with urology) for many years, depression (follows with mental health and doing well). She denies any chest pains, SOB, abdominal pain, joint pain, balance problems. She is not having any new problems. 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 She denies any urinary symptoms today but has apt later this month with urology.   Review of Systems  Constitutional: Negative for fever, activity change, appetite change, fatigue and unexpected weight change.  HENT: Negative.   Respiratory: Negative for cough, chest tightness, shortness of breath and wheezing.   Cardiovascular: Negative for chest pain, palpitations and leg swelling.  Gastrointestinal: Negative for abdominal pain, diarrhea, constipation, blood in stool and abdominal distention.  Genitourinary: Negative.   Musculoskeletal: Negative.   Skin: Negative.   Neurological: Negative.   Psychiatric/Behavioral: Negative.       Objective:   Physical Exam  Constitutional: She is oriented to person, place, and time. She appears well-developed and well-nourished.  HENT:  Head: Normocephalic and atraumatic.  Eyes: EOM are normal.  Neck: Normal range of motion.  Cardiovascular: Normal rate and regular rhythm.   Pulmonary/Chest: Effort normal and breath sounds normal. No respiratory distress. She has no wheezes. She has no rales.  Abdominal: Soft. Bowel sounds are normal. She exhibits no distension. There is no tenderness. There is no rebound.  Musculoskeletal: She exhibits no edema or tenderness.  Neurological: She is alert and oriented to person, place, and time. Coordination normal.  Skin: Skin is warm and dry.  Psychiatric: She has a normal mood and affect. Her behavior is normal.   Filed Vitals:   02/19/14 1502  BP: 132/82  Pulse: 88  Temp: 98  F (36.7 C)  TempSrc: Oral  Resp: 12  Height: 5\' 4"  (1.626 m)  Weight: 128 lb 12.8 oz (58.423 kg)  SpO2: 97%      Assessment & Plan:

## 2014-02-19 NOTE — Assessment & Plan Note (Signed)
Does not tolerate macrobid very well, generally takes cipro with infections and has pyridium when needed for pain. Follows with urology but sometimes she is unable to get visits with them acutely.

## 2014-02-19 NOTE — Patient Instructions (Signed)
We will have you stop by the lab fasting sometime this week so we can check on your cholesterol.  If you have any problems with UTI and are not able to get to the urologist you can call our office and we can have you leave a sample at the lab.   Come back in about 1-2 years for a physical but feel free to call our office if you have problems or questions sooner.

## 2014-02-19 NOTE — Assessment & Plan Note (Signed)
Doing well and mild symptoms if any. Taking pristiq.

## 2014-07-02 ENCOUNTER — Other Ambulatory Visit: Payer: Self-pay | Admitting: *Deleted

## 2014-07-02 MED ORDER — LINACLOTIDE 145 MCG PO CAPS
145.0000 ug | ORAL_CAPSULE | Freq: Every day | ORAL | Status: DC
Start: 2014-07-02 — End: 2015-08-30

## 2014-08-25 ENCOUNTER — Ambulatory Visit (HOSPITAL_COMMUNITY)
Admission: RE | Admit: 2014-08-25 | Discharge: 2014-08-25 | Disposition: A | Discharge status: Home / Self Care | Payer: 59 | Source: Ambulatory Visit | Attending: Family Medicine | Admitting: Family Medicine

## 2014-08-25 ENCOUNTER — Ambulatory Visit (INDEPENDENT_AMBULATORY_CARE_PROVIDER_SITE_OTHER): Payer: 59 | Admitting: Family Medicine

## 2014-08-25 ENCOUNTER — Encounter: Payer: Self-pay | Admitting: Family Medicine

## 2014-08-25 VITALS — BP 102/70 | HR 88 | Temp 98.6°F | Ht 64.0 in | Wt 128.0 lb

## 2014-08-25 DIAGNOSIS — K219 Gastro-esophageal reflux disease without esophagitis: Secondary | ICD-10-CM | POA: Diagnosis not present

## 2014-08-25 DIAGNOSIS — R05 Cough: Secondary | ICD-10-CM | POA: Diagnosis present

## 2014-08-25 DIAGNOSIS — R059 Cough, unspecified: Secondary | ICD-10-CM

## 2014-08-25 MED ORDER — BENZONATATE 100 MG PO CAPS: 100.0000 mg | ORAL_CAPSULE | Freq: Three times a day (TID) | ORAL | Status: DC | PRN

## 2014-08-25 MED ORDER — ALBUTEROL SULFATE HFA 108 (90 BASE) MCG/ACT IN AERS: 2.0000 | INHALATION_SPRAY | Freq: Four times a day (QID) | RESPIRATORY_TRACT | Status: DC | PRN

## 2014-08-25 MED ORDER — ZANTAC 150 MG PO TABS: 150.0000 mg | ORAL_TABLET | Freq: Every day | ORAL | Status: DC

## 2014-08-25 NOTE — Progress Notes (Signed)
Pre visit review using our clinic review tool, if applicable. No additional management support is needed unless otherwise documented below in the visit note. 

## 2014-08-25 NOTE — Patient Instructions (Signed)
Probiotic daily such as [employer] or [facility] or online at [employer] makes a 10 strain version  Cough, Adult  A cough is a reflex that helps clear your throat and airways. It can help heal the body or may be a reaction to an irritated airway. A cough may only last 2 or 3 weeks (acute) or may last more than 8 weeks (chronic).  CAUSES Acute cough:  Viral or bacterial infections. Chronic cough:  Infections.  Allergies.  Asthma.  Post-nasal drip.  Smoking.  Heartburn or acid reflux.  Some medicines.  Chronic lung problems (COPD).  Cancer. SYMPTOMS   Cough.  Fever.  Chest pain.  Increased breathing rate.  High-pitched whistling sound when breathing (wheezing).  Colored mucus that you cough up (sputum). TREATMENT   A bacterial cough may be treated with antibiotic medicine.  A viral cough must run its course and will not respond to antibiotics.  Your caregiver may recommend other treatments if you have a chronic cough. HOME CARE INSTRUCTIONS   Only take over-the-counter or prescription medicines for pain, discomfort, or fever as directed by your caregiver. Use cough suppressants only as directed by your caregiver.  Use a cold steam vaporizer or humidifier in your bedroom or home to help loosen secretions.  Sleep in a semi-upright position if your cough is worse at night.  Rest as needed.  Stop smoking if you smoke. SEEK IMMEDIATE MEDICAL CARE IF:   You have pus in your sputum.  Your cough starts to worsen.  You cannot control your cough with suppressants and are losing sleep.  You begin coughing up blood.  You have difficulty breathing.  You develop pain which is getting worse or is uncontrolled with medicine.  You have a fever. MAKE SURE YOU:   Understand these instructions.  Will watch your condition.  Will get help right away if you are not doing well or get worse. Document Released: 08/01/2010  Document Revised: 04/27/2011 Document Reviewed: 08/01/2010 [facility] Patient Information 2015 [place], [place]. This information is not intended to replace advice given to you by your health care provider. Make sure you discuss any questions you have with your health care provider.

## 2014-08-26 ENCOUNTER — Encounter: Payer: Self-pay | Admitting: Family Medicine

## 2014-08-26 DIAGNOSIS — R05 Cough: Secondary | ICD-10-CM | POA: Insufficient documentation

## 2014-08-26 DIAGNOSIS — R059 Cough, unspecified: Secondary | ICD-10-CM | POA: Insufficient documentation

## 2014-08-26 HISTORY — DX: Cough, unspecified: R05.9

## 2014-08-26 NOTE — Assessment & Plan Note (Signed)
Unclear etiology, reflux vs pulmonary cause. Has started Omeprazole without improvement, will continue Omeprazole in am and add Ranitidine in pm. Add a probiotic and avoid acidic foods. CXR unremarkable. She reports the cough worsens when she is hot suggestive of reactive airway disease. Given an Albuterol inhaler to use prn for sob or cough. May use Tessalon Perles prn for cough. Patient is hesitant to start an antibiotic but if symptoms worsen she may have to consider.

## 2014-08-26 NOTE — Progress Notes (Signed)
Amanda Alvarez  [id number] [birth year] 08/26/2014      Progress Note-Follow Up  Subjective  Chief Complaint  Chief Complaint  Patient presents with  . Cough    HPI  Patient is a 49 y.o. female in today for routine medical care. Patient is in today complaining of a cough. She's had the cough for roughly 3-4 weeks. She describes it as a dry cough and never productive. She denies any significant congestion, fevers or chills. She feels it is related to reflux and she notes tightness in her epigastrium with a burning that rises to her chest when she starts coughing. She reports he is sleeping okay but she has a tightness in her chest. The cough is becoming troublesome when she is working. She denies shortness of breath on an ongoing basis although she does note that tightness and cough take her breath away slightly especially when she is hot. Has added Prilosec twice daily with no significant change in her symptoms. No one around her has similar symptoms. Denies CP/palp/SOB/HA/congestion/fevers or GU c/o. Taking meds as prescribed  Past Medical History  Diagnosis Date  . Major depressive disorder, single episode, severe, without mention of psychotic behavior   . Urinary tract infection, site not specified     chronic since childhood. Has had multiple eval: cysto x 3 all with chronic inflammation with lymphocytis invasion with plasma cells. for suppresion  . Cough 08/26/2014    Past Surgical History  Procedure Laterality Date  . Cystoscopy      with uretheral stretches    Family History  Problem Relation Age of Onset  . Atrial fibrillation Mother   . Hyperlipidemia Mother   . Coronary artery disease Father   . Hyperlipidemia Father   . Heart disease Father   . Colon polyps Father   . Hypertension Brother   . Diabetes Neg Hx   . Colon cancer Neg Hx   . Breast cancer Neg Hx   . Heart disease Other   . Heart disease Other   . Lung cancer Other   . Cancer Other     breast  .  Cancer Paternal Grandfather     History   Social History  . Marital Status: Married    Spouse Name: N/A  . Number of Children: 2  . Years of Education: 18   Occupational History  . 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 NURSE PRACTITIONER    Social History Main Topics  . Smoking status: Never Smoker   . Smokeless tobacco: Never Used  . Alcohol Use: 0.5 oz/week    1 drink(s) per week  . Drug Use: No  . Sexual Activity:    Partners: Male   Other Topics Concern  . Not on file   Social History Narrative   [name], [name], [name] Married '94 - 4 years, Divorced;remarried  '02   1 son - '05; 1 daughter - '03. Regular Exercise -  NO. No Abuse history             Current Outpatient Prescriptions on File Prior to Visit  Medication Sig Dispense Refill  . Armodafinil (NUVIGIL) 150 MG tablet Take 150 mg by mouth daily.    Marland Kitchen desvenlafaxine (PRISTIQ) 50 MG 24 hr tablet Take 50 mg by mouth daily.      . eszopiclone (LUNESTA) 1 MG TABS tablet Take 1 mg by mouth at bedtime as needed for sleep. Take immediately before bedtime    .  Linaclotide (LINZESS) 145 MCG CAPS capsule Take 1 capsule (145 mcg total) by mouth daily. (Patient not taking: Reported on 08/25/2014) 30 capsule 0  . phenazopyridine (PYRIDIUM) 200 MG tablet Take 1 tablet (200 mg total) by mouth 3 (three) times daily as needed for pain. (Patient not taking: Reported on 08/25/2014) 15 tablet 0  . Vitamin D, Cholecalciferol, 400 UNITS CHEW Chew by mouth. One by mouth daily     No current facility-administered medications on file prior to visit.    No Known Allergies  Review of Systems  Review of Systems  Constitutional: Negative for fever and malaise/fatigue.  HENT: Negative for congestion.   Eyes: Negative for discharge.  Respiratory: Positive for cough and shortness of breath. Negative for wheezing.   Cardiovascular: Negative for chest pain, palpitations and leg swelling.  Gastrointestinal:  Positive for heartburn. Negative for nausea, abdominal pain and diarrhea.  Genitourinary: Negative for dysuria.  Musculoskeletal: Negative for falls.  Skin: Negative for rash.  Neurological: Negative for loss of consciousness and headaches.  Endo/Heme/Allergies: Negative for polydipsia.  Psychiatric/Behavioral: Negative for depression and suicidal ideas. The patient is not nervous/anxious and does not have insomnia.     Objective  BP 102/70 mmHg  Pulse 88  Temp(Src) 98.6 F (37 C) (Oral)  Ht 5\' 4"  (1.626 m)  Wt 128 lb (58.06 kg)  BMI 21.96 kg/m2  SpO2 99%  LMP 08/18/2014  Physical Exam  Physical Exam  Constitutional: She is oriented to person, place, and time and well-developed, well-nourished, and in no distress. No distress.  HENT:  Head: Normocephalic and atraumatic.  Eyes: Conjunctivae are normal.  Neck: Neck supple. No thyromegaly present.  Cardiovascular: Normal rate, regular rhythm and normal heart sounds.   No murmur heard. Pulmonary/Chest: Effort normal and breath sounds normal. She has no wheezes.  Abdominal: She exhibits no distension and no mass.  Musculoskeletal: She exhibits no edema.  Lymphadenopathy:    She has no cervical adenopathy.  Neurological: She is alert and oriented to person, place, and time.  Skin: Skin is warm and dry. No rash noted. She is not diaphoretic.  Psychiatric: Memory, affect and judgment normal.    Lab Results  Component Value Date   TSH 1.69 09/23/2007   Lab Results  Component Value Date   WBC 5.6 03/15/2013   HGB 12.4 03/15/2013   HCT 35.6* 03/15/2013   MCV 86.2 03/15/2013   PLT 226 03/15/2013   Lab Results  Component Value Date   CREATININE 0.63 03/15/2013   BUN 11 03/15/2013   NA 133* 03/15/2013   K 3.7 03/15/2013   CL 97 03/15/2013   CO2 20 03/15/2013   Lab Results  Component Value Date   ALT 9 03/15/2013   AST 18 03/15/2013   ALKPHOS 57 03/15/2013   BILITOT 0.5 03/15/2013   No results found for: CHOL No  results found for: HDL No results found for: LDLCALC No results found for: TRIG No results found for: CHOLHDL   Assessment & Plan  Cough Unclear etiology, reflux vs pulmonary cause. Has started Omeprazole without improvement, will continue Omeprazole in am and add Ranitidine in pm. Add a probiotic and avoid acidic foods. CXR unremarkable. She reports the cough worsens when she is hot suggestive of reactive airway disease. Given an Albuterol inhaler to use prn for sob or cough. May use Tessalon Perles prn for cough. Patient is hesitant to start an antibiotic but if symptoms worsen she may have to consider.

## 2014-08-27 ENCOUNTER — Emergency Department (HOSPITAL_COMMUNITY)
Admission: EM | Admit: 2014-08-27 | Discharge: 2014-08-28 | Disposition: A | Discharge status: Home / Self Care | Payer: 59 | Attending: Emergency Medicine | Admitting: Emergency Medicine

## 2014-08-27 ENCOUNTER — Encounter (HOSPITAL_COMMUNITY): Payer: Self-pay | Admitting: Emergency Medicine

## 2014-08-27 DIAGNOSIS — Z87440 Personal history of urinary (tract) infections: Secondary | ICD-10-CM | POA: Insufficient documentation

## 2014-08-27 DIAGNOSIS — F322 Major depressive disorder, single episode, severe without psychotic features: Secondary | ICD-10-CM | POA: Insufficient documentation

## 2014-08-27 DIAGNOSIS — R0789 Other chest pain: Secondary | ICD-10-CM | POA: Insufficient documentation

## 2014-08-27 DIAGNOSIS — Z79899 Other long term (current) drug therapy: Secondary | ICD-10-CM | POA: Diagnosis not present

## 2014-08-27 DIAGNOSIS — R079 Chest pain, unspecified: Secondary | ICD-10-CM | POA: Diagnosis present

## 2014-08-27 LAB — CBC
HEMATOCRIT: 32.5 % — AB (ref 36.0–46.0)
Hemoglobin: 10.7 g/dL — ABNORMAL LOW (ref 12.0–15.0)
MCH: 28.8 pg (ref 26.0–34.0)
MCHC: 32.9 g/dL (ref 30.0–36.0)
MCV: 87.4 fL (ref 78.0–100.0)
Platelets: 288 10*3/uL (ref 150–400)
RBC: 3.72 MIL/uL — ABNORMAL LOW (ref 3.87–5.11)
RDW: 13.1 % (ref 11.5–15.5)
WBC: 6.1 10*3/uL (ref 4.0–10.5)

## 2014-08-27 LAB — BASIC METABOLIC PANEL
Anion gap: 7 (ref 5–15)
BUN: 11 mg/dL (ref 6–20)
CO2: 26 mmol/L (ref 22–32)
Calcium: 8.5 mg/dL — ABNORMAL LOW (ref 8.9–10.3)
Chloride: 105 mmol/L (ref 101–111)
Creatinine, Ser: 0.55 mg/dL (ref 0.44–1.00)
GFR calc Af Amer: >60 mL/min (ref >60)
GFR calc non Af Amer: >60 mL/min (ref >60)
Glucose, Bld: 108 mg/dL — ABNORMAL HIGH (ref 65–99)
Potassium: 3.5 mmol/L (ref 3.5–5.1)
Sodium: 138 mmol/L (ref 135–145)

## 2014-08-27 LAB — I-STAT TROPONIN, ED: Troponin i, poc: 0 ng/mL (ref 0.00–0.08)

## 2014-08-27 NOTE — ED Notes (Signed)
Pt states for the past 3.5 weeks she has had a pressure in her epigastric area that feels like something is pushing up causing her chest and throat to feel full and heavy and that feeling is causing her to cough  Pt does have a dry hacky cough noted  Pt states she went to her PCP on Saturday and had a chest xray that was negative  Pt states she started on Prilosec and Zantac for the past 3 weeks thinking she had GERD but it has not helped at all  Pt states she was seen by her GYN today and they did a fingerstick and her Hgb was at 9  Pt states today this feeling of pressure has been constant

## 2014-08-28 MED ORDER — IPRATROPIUM-ALBUTEROL 0.5-2.5 (3) MG/3ML IN SOLN
3.0000 mL | RESPIRATORY_TRACT | Status: DC
  Administered 2014-08-28: 3 mL via RESPIRATORY_TRACT
  Filled 2014-08-28 (×2): qty 3

## 2014-08-28 MED ORDER — SUCRALFATE 1 G PO TABS: 1.0000 g | ORAL_TABLET | Freq: Three times a day (TID) | ORAL | Status: DC

## 2014-08-28 MED ORDER — SUCRALFATE 1 G PO TABS
1.0000 g | ORAL_TABLET | Freq: Once | ORAL | Status: AC
  Administered 2014-08-28: 1 g via ORAL
  Filled 2014-08-28: qty 1

## 2014-08-28 NOTE — ED Provider Notes (Signed)
CSN: [id number]     Arrival date & time 08/27/14  2205 History   First MD Initiated Contact with Patient 08/28/14 0047     Chief Complaint  Patient presents with  . Chest Pain     (Consider location/radiation/quality/duration/timing/severity/associated sxs/prior Treatment) HPI  This is a 49 year old female with about a 3-1/2 week history of chest discomfort. She states it doesn't feel like pain it feels like her stomach is pressing up into her chest. 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 She has been on Prilosec 40 milligrams twice daily, Pepcid and gums without relief. She has had an associated dry cough but no shortness of breath. She was seen by her PCP 3 days ago and had a chest x-ray that was unremarkable. She was prescribed albuterol and Tessalon Perles which she has not yet filled. She was seen by her OB/GYN yesterday and told she was anemic; hemoglobin is noted to be 10.7 here. Symptoms are moderate and constant. She has not had any nausea or vomiting. She has not had a fever.   Past Medical History  Diagnosis Date  . Major depressive disorder, single episode, severe, without mention of psychotic behavior   . Urinary tract infection, site not specified     chronic since childhood. Has had multiple eval: cysto x 3 all with chronic inflammation with lymphocytis invasion with plasma cells. for suppresion  . Cough 08/26/2014   Past Surgical History  Procedure Laterality Date  . Cystoscopy      with uretheral stretches   Family History  Problem Relation Age of Onset  . Atrial fibrillation Mother   . Hyperlipidemia Mother   . Coronary artery disease Father   . Hyperlipidemia Father   . Heart disease Father   . Colon polyps Father   . Hypertension Brother   . Diabetes Neg Hx   . Colon cancer Neg Hx   . Breast cancer Neg Hx   . Heart disease Other   . Heart disease Other   . Lung cancer Other   . Cancer Other     breast  . Cancer Paternal  Grandfather    History  Substance Use Topics  . Smoking status: Never Smoker   . Smokeless tobacco: Never Used  . Alcohol Use: 0.5 oz/week    1 drink(s) per week   OB History    Gravida Para Term Preterm AB TAB SAB Ectopic Multiple Living   2 2              Obstetric Comments   NSVD     Review of Systems  All other systems reviewed and are negative.   Allergies  Review of patient's allergies indicates no known allergies.  Home Medications   Prior to Admission medications   Medication Sig Start Date End Date Taking? Authorizing Provider  Armodafinil (NUVIGIL) 150 MG tablet Take 150 mg by mouth daily as needed (shift work disorder).    Yes Historical Provider, MD  desvenlafaxine (PRISTIQ) 50 MG 24 hr tablet Take 50 mg by mouth daily.     Yes Historical Provider, MD  eszopiclone (LUNESTA) 1 MG TABS tablet Take 1 mg by mouth at bedtime as needed for sleep. Take immediately before bedtime   Yes Historical Provider, MD  Linaclotide (LINZESS) 145 MCG CAPS capsule Take 1 capsule (145 mcg total) by mouth daily. Patient taking differently: Take 145 mcg by mouth daily as needed (for constipation).  07/02/14  Yes Lafayette Dragon, MD  omeprazole (PRILOSEC) 40 MG capsule Take 40 mg by mouth 2 (two) times daily.   Yes Historical Provider, MD  phenazopyridine (PYRIDIUM) 200 MG tablet Take 1 tablet (200 mg total) by mouth 3 (three) times daily as needed for pain. Patient taking differently: Take 200 mg by mouth 3 (three) times daily as needed for pain. For bladder infection 01/22/14  Yes Lori P Hvozdovic, PA-C  albuterol (PROVENTIL HFA;VENTOLIN HFA) 108 (90 BASE) MCG/ACT inhaler Inhale 2 puffs into the lungs every 6 (six) hours as needed for wheezing or shortness of breath. 08/25/14   Mosie Lukes, MD  benzonatate (TESSALON PERLES) 100 MG capsule Take 1 capsule (100 mg total) by mouth 3 (three) times daily as needed for cough. 08/25/14   Mosie Lukes, MD  Vitamin D, Cholecalciferol, 400 UNITS CHEW  Chew 400 Units by mouth daily. One by mouth daily    Historical Provider, MD  ZANTAC 150 MG tablet Take 1 tablet (150 mg total) by mouth at bedtime. 08/25/14   Mosie Lukes, MD   BP 113/69 mmHg  Pulse 68  Temp(Src) 98 F (36.7 C) (Oral)  Resp 18  SpO2 95%  LMP 08/18/2014   Physical Exam  General: Well-developed, well-nourished female in no acute distress; appearance consistent with age of record HENT: normocephalic; atraumatic Eyes: pupils equal, round and reactive to light; extraocular muscles intact Neck: supple Heart: regular rate and rhythm; no murmurs, rubs or gallops Lungs: clear to auscultation bilaterally Abdomen: soft; nondistended; nontender; no masses or hepatosplenomegaly; bowel sounds present; pressure on the left upper quadrant invoked coughing Extremities: No deformity; full range of motion; pulses normal Neurologic: Awake, alert and oriented; motor function intact in all extremities and symmetric; no facial droop Skin: Warm and dry Psychiatric: Normal mood and affect    ED Course  Procedures (including critical care time)   EKG Interpretation   Date/Time:  Monday August 27 2014 22:17:03 EDT Ventricular Rate:  83 PR Interval:  163 QRS Duration: 93 QT Interval:  367 QTC Calculation: 431 R Axis:   86 Text Interpretation:  Sinus rhythm Probable anteroseptal infarct, old No  significant change was found Confirmed by Florina Ou  MD, Jenny Reichmann ([id number]) on  08/28/2014 12:17:31 AM      MDM   Nursing notes and vitals signs, including pulse oximetry, reviewed.  Summary of this visit's results, reviewed by myself:  Labs:  Results for orders placed or performed during the hospital encounter of 08/27/14 (from the past 24 hour(s))  Basic metabolic panel     Status: Abnormal   Collection Time: 08/27/14 10:40 PM  Result Value Ref Range   Sodium 138 135 - 145 mmol/L   Potassium 3.5 3.5 - 5.1 mmol/L   Chloride 105 101 - 111 mmol/L   CO2 26 22 - 32 mmol/L   Glucose, Bld 108  (H) 65 - 99 mg/dL   BUN 11 6 - 20 mg/dL   Creatinine, Ser 0.55 0.44 - 1.00 mg/dL   Calcium 8.5 (L) 8.9 - 10.3 mg/dL   GFR calc non Af Amer >60 >60 mL/min   GFR calc Af Amer >60 >60 mL/min   Anion gap 7 5 - 15  CBC     Status: Abnormal   Collection Time: 08/27/14 10:40 PM  Result Value Ref Range   WBC 6.1 4.0 - 10.5 K/uL   RBC 3.72 (L) 3.87 - 5.11 MIL/uL   Hemoglobin 10.7 (L) 12.0 - 15.0 g/dL   HCT 32.5 (L) 36.0 - 46.0 %  MCV 87.4 78.0 - 100.0 fL   MCH 28.8 26.0 - 34.0 pg   MCHC 32.9 30.0 - 36.0 g/dL   RDW 13.1 11.5 - 15.5 %   Platelets 288 150 - 400 K/uL  I-stat troponin, ED     Status: None   Collection Time: 08/27/14 10:48 PM  Result Value Ref Range   Troponin i, poc 0.00 0.00 - 0.08 ng/mL   Comment 3           2:17 AM Patient feels significantly better after DuoNeb treatment and Carafate 1 gram orally. She already has a prescription for an albuterol inhaler and is requesting a prescription for Carafate. It is unclear if this represents mild bronchospasm versus GERD.     Shanon Rosser, MD 08/28/14 [phone]

## 2014-10-09 ENCOUNTER — Telehealth: Payer: Self-pay

## 2014-10-09 NOTE — Telephone Encounter (Signed)
Pt stated that she did not request the medication.  Pt stated that she was going to call her pharmacy and clear up any mistake. I stated that I did not remember it coming from [facility].

## 2014-10-09 NOTE — Telephone Encounter (Signed)
Fax request for Dicyclomine 10 mg capsule; #30 and no refills.   Not currently on med list so I did not pend.

## 2014-10-09 NOTE — Telephone Encounter (Signed)
Please call patient and see if she even wants.

## 2014-12-17 ENCOUNTER — Ambulatory Visit (HOSPITAL_COMMUNITY)
Admission: RE | Admit: 2014-12-17 | Discharge: 2014-12-17 | Disposition: A | Discharge status: Home / Self Care | Payer: 59 | Source: Ambulatory Visit | Attending: Internal Medicine | Admitting: Internal Medicine

## 2014-12-17 ENCOUNTER — Ambulatory Visit
Admission: RE | Admit: 2014-12-17 | Discharge: 2014-12-17 | Disposition: A | Discharge status: Home / Self Care | Payer: 59 | Source: Ambulatory Visit | Attending: Internal Medicine | Admitting: Internal Medicine

## 2014-12-17 ENCOUNTER — Other Ambulatory Visit: Payer: Self-pay

## 2014-12-17 DIAGNOSIS — K5649 Other impaction of intestine: Secondary | ICD-10-CM

## 2014-12-17 DIAGNOSIS — R109 Unspecified abdominal pain: Secondary | ICD-10-CM | POA: Insufficient documentation

## 2014-12-27 LAB — HM PAP SMEAR: HM Pap smear: NEGATIVE

## 2015-01-07 ENCOUNTER — Encounter: Payer: Self-pay | Admitting: Family Medicine

## 2015-01-31 LAB — HM COLONOSCOPY

## 2015-03-08 DIAGNOSIS — Z30430 Encounter for insertion of intrauterine contraceptive device: Secondary | ICD-10-CM | POA: Diagnosis not present

## 2015-03-14 MED FILL — PRISTIQ ER 100 MG TABLET: 100 | 30 days supply | Qty: 30 | Fill #2

## 2015-03-18 DIAGNOSIS — F411 Generalized anxiety disorder: Secondary | ICD-10-CM | POA: Diagnosis not present

## 2015-03-18 DIAGNOSIS — F605 Obsessive-compulsive personality disorder: Secondary | ICD-10-CM | POA: Diagnosis not present

## 2015-03-22 MED FILL — ESZOPICLONE 3 MG TABLET: 3 | 30 days supply | Qty: 30 | Fill #0

## 2015-03-25 MED FILL — MEDROXYPROGESTERONE 10 MG T: 10 | 7 days supply | Qty: 7 | Fill #0

## 2015-04-08 MED FILL — ARMODAFINIL 150 MG TABLET: 150 | 30 days supply | Qty: 30 | Fill #1

## 2015-04-16 MED FILL — PRISTIQ ER 100 MG TABLET: 100 | 30 days supply | Qty: 30 | Fill #0

## 2015-05-17 MED FILL — ARMODAFINIL 150 MG TABLET: 150 | 30 days supply | Qty: 30 | Fill #0

## 2015-05-17 MED FILL — ESZOPICLONE 3 MG TABLET: 3 | 30 days supply | Qty: 30 | Fill #0

## 2015-05-17 MED FILL — DESVENLAFAXINE SUC ER 100 M: 100 | 30 days supply | Qty: 30 | Fill #1

## 2015-05-23 MED FILL — TRIMETHOPRIM 100 MG TABLET: 100 | 30 days supply | Qty: 30 | Fill #0

## 2015-06-20 DIAGNOSIS — D225 Melanocytic nevi of trunk: Secondary | ICD-10-CM | POA: Diagnosis not present

## 2015-06-20 DIAGNOSIS — L821 Other seborrheic keratosis: Secondary | ICD-10-CM | POA: Diagnosis not present

## 2015-06-20 DIAGNOSIS — L814 Other melanin hyperpigmentation: Secondary | ICD-10-CM | POA: Diagnosis not present

## 2015-06-20 DIAGNOSIS — L579 Skin changes due to chronic exposure to nonionizing radiation, unspecified: Secondary | ICD-10-CM | POA: Diagnosis not present

## 2015-06-20 MED FILL — HYDROQUINONE 4% CREAM: 4 | 30 days supply | Qty: 28 | Fill #0

## 2015-06-24 MED FILL — TRETINOIN 0.05% CREAM: 0.05 | 30 days supply | Qty: 20 | Fill #0

## 2015-07-02 MED FILL — NUVIGIL 150 MG TABLET: 150 | 30 days supply | Qty: 30 | Fill #1

## 2015-07-02 MED FILL — ESZOPICLONE 3 MG TABLET: 3 | 30 days supply | Qty: 30 | Fill #1

## 2015-07-02 MED FILL — DESVENLAFAXINE SUC ER 100 M: 100 | 30 days supply | Qty: 30 | Fill #2

## 2015-07-18 ENCOUNTER — Encounter: Payer: 59 | Admitting: Internal Medicine

## 2015-07-18 DIAGNOSIS — Z0289 Encounter for other administrative examinations: Secondary | ICD-10-CM

## 2015-07-31 MED FILL — DESVENLAFAXINE SUC ER 100 M: 100 | 30 days supply | Qty: 30 | Fill #3

## 2015-08-02 MED FILL — NUVIGIL 150 MG TABLET: 150 | 30 days supply | Qty: 30 | Fill #0

## 2015-08-02 MED FILL — ESZOPICLONE 3 MG TABLET: 3 | 30 days supply | Qty: 30 | Fill #0

## 2015-08-07 MED FILL — ONDANSETRON HCL 4 MG TABLET: 4 | 7 days supply | Qty: 30 | Fill #0

## 2015-08-12 ENCOUNTER — Telehealth: Payer: Self-pay | Admitting: Family Medicine

## 2015-08-12 NOTE — Telephone Encounter (Signed)
Will work patient in 9 45 tomorrow Tuesday 27th morning if available. To arrive 9 30.   IF she is available- trying to reach out to patient through physician that reached out to me

## 2015-08-12 NOTE — Telephone Encounter (Signed)
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 Otherwise healthy

## 2015-08-13 ENCOUNTER — Encounter: Payer: Self-pay | Admitting: Family Medicine

## 2015-08-13 ENCOUNTER — Ambulatory Visit (INDEPENDENT_AMBULATORY_CARE_PROVIDER_SITE_OTHER): Payer: 59 | Admitting: Family Medicine

## 2015-08-13 DIAGNOSIS — G4726 Circadian rhythm sleep disorder, shift work type: Secondary | ICD-10-CM | POA: Insufficient documentation

## 2015-08-13 DIAGNOSIS — K5900 Constipation, unspecified: Secondary | ICD-10-CM | POA: Insufficient documentation

## 2015-08-13 DIAGNOSIS — R1013 Epigastric pain: Secondary | ICD-10-CM | POA: Diagnosis not present

## 2015-08-13 DIAGNOSIS — N390 Urinary tract infection, site not specified: Secondary | ICD-10-CM

## 2015-08-13 DIAGNOSIS — F329 Major depressive disorder, single episode, unspecified: Secondary | ICD-10-CM | POA: Diagnosis not present

## 2015-08-13 DIAGNOSIS — D649 Anemia, unspecified: Secondary | ICD-10-CM | POA: Insufficient documentation

## 2015-08-13 LAB — COMPREHENSIVE METABOLIC PANEL
ALT: 10 U/L (ref 0–35)
AST: 15 U/L (ref 0–37)
Albumin: 4.4 g/dL (ref 3.5–5.2)
Alkaline Phosphatase: 47 U/L (ref 39–117)
BILIRUBIN TOTAL: 0.6 mg/dL (ref 0.2–1.2)
BUN: 12 mg/dL (ref 6–23)
CALCIUM: 9.3 mg/dL (ref 8.4–10.5)
CHLORIDE: 104 meq/L (ref 96–112)
CO2: 32 meq/L (ref 19–32)
Creatinine, Ser: 0.67 mg/dL (ref 0.40–1.20)
GFR: 99.71 mL/min (ref >60.00)
Glucose, Bld: 90 mg/dL (ref 70–99)
Potassium: 4.5 mEq/L (ref 3.5–5.1)
Sodium: 138 mEq/L (ref 135–145)
Total Protein: 7.1 g/dL (ref 6.0–8.3)

## 2015-08-13 LAB — CBC WITH DIFFERENTIAL/PLATELET
BASOS PCT: 0.7 % (ref 0.0–3.0)
Basophils Absolute: 0 10*3/uL (ref 0.0–0.1)
EOS PCT: 1.7 % (ref 0.0–5.0)
Eosinophils Absolute: 0.1 10*3/uL (ref 0.0–0.7)
HEMATOCRIT: 38.1 % (ref 36.0–46.0)
Hemoglobin: 12.9 g/dL (ref 12.0–15.0)
LYMPHS PCT: 31 % (ref 12.0–46.0)
Lymphs Abs: 2 10*3/uL (ref 0.7–4.0)
MCHC: 33.8 g/dL (ref 30.0–36.0)
MCV: 92.1 fl (ref 78.0–100.0)
MONOS PCT: 6.1 % (ref 3.0–12.0)
Monocytes Absolute: 0.4 10*3/uL (ref 0.1–1.0)
NEUTROS ABS: 3.9 10*3/uL (ref 1.4–7.7)
Neutrophils Relative %: 60.5 % (ref 43.0–77.0)
PLATELETS: 274 10*3/uL (ref 150.0–400.0)
RBC: 4.14 Mil/uL (ref 3.87–5.11)
RDW: 12.7 % (ref 11.5–15.5)
WBC: 6.4 10*3/uL (ref 4.0–10.5)

## 2015-08-13 LAB — LIPASE: LIPASE: 11 U/L (ref 11.0–59.0)

## 2015-08-13 MED ORDER — SUCRALFATE 1 G PO TABS: 1.0000 g | ORAL_TABLET | Freq: Three times a day (TID) | ORAL | Status: DC

## 2015-08-13 MED FILL — SUCRALFATE 1 GM TABLET: 1 | 10 days supply | Qty: 40 | Fill #0

## 2015-08-13 NOTE — Progress Notes (Signed)
Phone: [phone]  Subjective:  Patient presents today to establish care. Chief complaint-noted.   See problem oriented charting  The following were reviewed and entered/updated in epic: Past Medical History  Diagnosis Date  . Major depressive disorder, single episode, severe, without mention of psychotic behavior   . Urinary tract infection, site not specified     chronic since childhood. Has had multiple eval: cysto x 3 all with chronic inflammation with lymphocytis invasion with plasma cells. for suppresion  . Cough 08/26/2014   Patient Active Problem List   Diagnosis Date Noted  . Depression 12/13/2007    Priority: Medium  . Chronic UTI 08/22/2007    Priority: Medium  . 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 Constipation 08/13/2015    Priority: Low  . Anemia 08/13/2015   Past Surgical History  Procedure Laterality Date  . Cystoscopy      with uretheral stretches    Family History  Problem Relation Age of Onset  . Atrial fibrillation Mother   . Hyperlipidemia Mother   . Coronary artery disease Father     early 2s, nonsmoker  . Hyperlipidemia Father   . Colon polyps Father   . Hypertension Brother   . Hypothyroidism Mother   . Colon cancer Neg Hx   . Breast cancer Neg Hx   . Heart disease Other     dads side  . Heart disease Other     dads side  . Lung cancer Other   . Cancer Other     breast  . Cancer Paternal Grandfather     Medications- reviewed and updated Current Outpatient Prescriptions  Medication Sig Dispense Refill  . Armodafinil (NUVIGIL) 150 MG tablet Take 150 mg by mouth daily as needed (shift work disorder).     Marland Kitchen desvenlafaxine (PRISTIQ) 50 MG 24 hr tablet Take 100 mg by mouth daily.     . eszopiclone (LUNESTA) 1 MG TABS tablet Take 1 mg by mouth at bedtime as needed for sleep. Take immediately before bedtime    . Linaclotide (LINZESS) 145 MCG CAPS capsule Take 1 capsule (145 mcg total) by mouth daily. (Patient taking  differently: Take 145 mcg by mouth daily as needed (for constipation). ) 30 capsule 0  . omeprazole (PRILOSEC) 40 MG capsule Take 40 mg by mouth 2 (two) times daily.    . ondansetron (ZOFRAN-ODT) 4 MG disintegrating tablet Take 4 mg by mouth every 6 (six) hours as needed for nausea or vomiting.    . phenazopyridine (PYRIDIUM) 200 MG tablet Take 1 tablet (200 mg total) by mouth 3 (three) times daily as needed for pain. (Patient taking differently: Take 200 mg by mouth 3 (three) times daily as needed for pain. For bladder infection) 15 tablet 0  . Vitamin D, Cholecalciferol, 400 UNITS CHEW Chew 400 Units by mouth daily. One by mouth daily     No current facility-administered medications for this visit.    Allergies-reviewed and updated No Known Allergies  Social History   Social History  . Marital Status: Married    Spouse Name: N/A  . Number of Children: 2  . Years of Education: 18   Occupational History  . 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 Smoking status: Never Smoker   . Smokeless tobacco: Never Used  . Alcohol Use: 0.0 - 0.6 oz/week    0-1 Standard drinks or equivalent per  week  . Drug Use: No  . Sexual Activity:    Partners: Male   Other Topics Concern  . None   Social History Narrative   Married '94 - 4 years, Divorced;remarried  '02   1 son - '05; 1 daughter - '03. 4 people in household      [name], [place] MSN, [name]       Hobbies: tennis, time with family    ROS--Full ROS was completed Review of Systems  Constitutional: Negative for fever and chills.  HENT: Negative for hearing loss.   Eyes: Negative for blurred vision and double vision.  Respiratory: Negative for cough and shortness of breath.   Cardiovascular: Negative for chest pain and palpitations.  Gastrointestinal: Positive for nausea, abdominal pain and constipation. Negative for diarrhea, blood in stool and melena.    Genitourinary: Negative for dysuria and urgency.  Musculoskeletal: Negative for myalgias and neck pain.  Skin: Negative for itching and rash.  Neurological: Negative for dizziness, tingling and headaches.  Endo/Heme/Allergies: Negative for polydipsia. Does not bruise/bleed easily.  Psychiatric/Behavioral: Positive for depression. Negative for substance abuse.   Objective: BP 104/72 mmHg  Pulse 79  Temp(Src) 98.8 F (37.1 C) (Oral)  Ht 5\' 4"  (1.626 m)  Wt 126 lb (57.153 kg)  BMI 21.62 kg/m2  SpO2 97%  LMP 07/30/2015 (Approximate) Gen: NAD, resting comfortably HEENT: Mucous membranes are moist. Oropharynx normal.  Eyes: sclera and lids normal, PERRLA CV: RRR no murmurs rubs or gallops Lungs: CTAB no crackles, wheeze, rhonchi Abdomen: soft/tender in epigastric area mildly- nontender remaining abdomen/nondistended/normal bowel sounds. No rebound or guarding.  Ext: no edema Skin: warm, dry Neuro: 5/5 strength in upper and lower extremities, normal gait, normal reflexes  Assessment/Plan:  Nausea, epigastric pain S: nausea started 10 days ago. Started taking zantac (had been taking motrin recently from abnormal position at work). Remained nauseated- had zofran called in. Was having to take it regularly then changed to prilosec given continued issues. 20mg  OTC was taking BID. Sunday started with upper abdominal discomfort going through to back moderate level lasting Sunday into Monday. Constant fist stuck in her upper stomach sensation, rated as mild for most part. Patient reported anemia issues in past with ferritin to 9 due to GGYN concerns- endoscopy and colonoscopy were normal. Received iron infusions in past but not currently. Constipation better with flaxseed. REgular BM, not firm with no recent linzess.   A/P: Potential gastric ulcer after recent use of mobic. Gallbladder issues in diagnosis. Will get  Cbc, cmp, lipase- which all came back normal. Considered abdominal ultrasound  which we will hold off on. Continue use of prilosec 20mg  BID and add carafate 4x each day- get abd [place] if not improving within a week or worsens. Also could consider CT if febrile or significantly worsening or persists and u/s normal.   Orders Placed This Encounter  Procedures  . CBC with Differential/Platelet  . Comprehensive metabolic panel    [place]  . Lipase    Meds ordered this encounter  Medications  . ondansetron (ZOFRAN-ODT) 4 MG disintegrating tablet    Sig: Take 4 mg by mouth every 6 (six) hours as needed for nausea or vomiting.  . sucralfate (CARAFATE) 1 g tablet    Sig: Take 1 tablet (1 g total) by mouth 4 (four) times daily -  with meals and at bedtime.    Dispense:  40 tablet    Refill:  1  new acute problem with uncertain diagnosis, medication  management with carafate  Return precautions advised.  Garret Reddish, MD

## 2015-08-13 NOTE — Patient Instructions (Signed)
Labs before you go- cbc, cmp, lipase  Start carafate 1g up to 4x a day. Continue prilosec twice a day for about a month. Update me if symptoms worsen or if persist into next week and we can get ultrasound at minimum of abdomen. Consider CT option as well

## 2015-08-13 NOTE — Progress Notes (Signed)
Pre visit review using our clinic review tool, if applicable. No additional management support is needed unless otherwise documented below in the visit note. 

## 2015-08-28 DIAGNOSIS — F411 Generalized anxiety disorder: Secondary | ICD-10-CM | POA: Diagnosis not present

## 2015-08-28 DIAGNOSIS — F605 Obsessive-compulsive personality disorder: Secondary | ICD-10-CM | POA: Diagnosis not present

## 2015-08-30 ENCOUNTER — Other Ambulatory Visit: Payer: Self-pay

## 2015-08-30 ENCOUNTER — Telehealth: Payer: Self-pay | Admitting: Family Medicine

## 2015-08-30 MED ORDER — LINACLOTIDE 145 MCG PO CAPS: 145.0000 ug | ORAL_CAPSULE | Freq: Every day | ORAL | Status: DC

## 2015-08-30 MED FILL — LINZESS 145 MCG CAPSULE: 145 | 30 days supply | Qty: 30 | Fill #0

## 2015-08-30 NOTE — Telephone Encounter (Signed)
Prescription dent to the pharmacy as requested

## 2015-08-30 NOTE — Telephone Encounter (Signed)
Pt need new Rx for linaclotide Rolan Lipa)   Pharm:  [place]

## 2015-09-05 MED FILL — AMOX-CLAV 875-125 MG TABLET: 875-125 | 7 days supply | Qty: 14 | Fill #0

## 2015-09-09 MED FILL — DESVENLAFAXINE SUC ER 100 M: 100 | 30 days supply | Qty: 30 | Fill #4

## 2015-10-28 MED FILL — LINZESS 145 MCG CAPSULE: 145 | 30 days supply | Qty: 30 | Fill #1 | Status: TO

## 2015-10-28 MED FILL — ESZOPICLONE 3 MG TABLET: 3 | 30 days supply | Qty: 30 | Fill #1

## 2015-11-06 DIAGNOSIS — F411 Generalized anxiety disorder: Secondary | ICD-10-CM | POA: Diagnosis not present

## 2015-11-06 DIAGNOSIS — F605 Obsessive-compulsive personality disorder: Secondary | ICD-10-CM | POA: Diagnosis not present

## 2015-11-06 MED FILL — DESVENLAFAXINE SUC ER 100 M: 100 | 30 days supply | Qty: 30 | Fill #0

## 2015-11-22 MED FILL — NUVIGIL 150 MG TABLET: 150 | 30 days supply | Qty: 30 | Fill #0

## 2015-11-28 ENCOUNTER — Institutional Professional Consult (permissible substitution): Payer: 59 | Admitting: Neurology

## 2015-12-12 MED FILL — TRIMETHOPRIM 100 MG TABLET: 100 | 30 days supply | Qty: 30 | Fill #1

## 2015-12-12 MED FILL — DESVENLAFAXINE SUC ER 100 M: 100 | 30 days supply | Qty: 30 | Fill #1 | Status: TO

## 2015-12-13 MED FILL — ESZOPICLONE 3 MG TABLET: 3 | 30 days supply | Qty: 30 | Fill #0

## 2016-01-08 MED FILL — NUVIGIL 150 MG TABLET: 150 | 30 days supply | Qty: 30 | Fill #1 | Status: TO

## 2016-01-08 MED FILL — LINZESS 145 MCG CAPSULE: 145 | 30 days supply | Qty: 30 | Fill #0

## 2016-01-27 MED FILL — DESVENLAFAXINE SUC ER 100 M: 100 | 30 days supply | Qty: 30 | Fill #0

## 2016-01-28 MED FILL — DICYCLOMINE 10 MG CAPSULE: 10 | 22 days supply | Qty: 90 | Fill #0

## 2016-02-13 MED FILL — ESZOPICLONE 3 MG TABLET: 3 | 30 days supply | Qty: 30 | Fill #0

## 2016-03-05 MED FILL — ARMODAFINIL 150 MG TABLET: 150 | 30 days supply | Qty: 30 | Fill #0

## 2016-03-05 MED FILL — DESVENLAFAXINE SUC ER 100 M: 100 | 30 days supply | Qty: 30 | Fill #0 | Status: TO

## 2016-03-16 DIAGNOSIS — F605 Obsessive-compulsive personality disorder: Secondary | ICD-10-CM | POA: Diagnosis not present

## 2016-03-16 DIAGNOSIS — F411 Generalized anxiety disorder: Secondary | ICD-10-CM | POA: Diagnosis not present

## 2016-03-19 MED FILL — ESZOPICLONE 3 MG TABLET: 3 | 30 days supply | Qty: 30 | Fill #0

## 2016-04-14 MED FILL — DESVENLAFAXINE SUC ER 100 M: 100 | 30 days supply | Qty: 30 | Fill #0

## 2016-04-21 DIAGNOSIS — Z6821 Body mass index (BMI) 21.0-21.9, adult: Secondary | ICD-10-CM | POA: Diagnosis not present

## 2016-04-21 DIAGNOSIS — Z01419 Encounter for gynecological examination (general) (routine) without abnormal findings: Secondary | ICD-10-CM | POA: Diagnosis not present

## 2016-04-21 DIAGNOSIS — R3915 Urgency of urination: Secondary | ICD-10-CM | POA: Diagnosis not present

## 2016-04-21 DIAGNOSIS — Z1231 Encounter for screening mammogram for malignant neoplasm of breast: Secondary | ICD-10-CM | POA: Diagnosis not present

## 2016-04-21 LAB — HM PAP SMEAR: HM PAP: NEGATIVE

## 2016-04-21 MED FILL — NUVIGIL 150 MG TABLET: 150 | 30 days supply | Qty: 30 | Fill #0 | Status: TO

## 2016-05-26 MED FILL — DESVENLAFAXINE SUC ER 100 M: 100 | 30 days supply | Qty: 30 | Fill #0

## 2016-05-26 MED FILL — LINZESS 145 MCG CAPSULE: 145 | 30 days supply | Qty: 30 | Fill #1

## 2016-06-29 DIAGNOSIS — L738 Other specified follicular disorders: Secondary | ICD-10-CM | POA: Diagnosis not present

## 2016-06-29 MED FILL — TRIAMCINOLONE 0.1% CREAM: 0.1 | 15 days supply | Qty: 454 | Fill #0

## 2016-06-30 MED FILL — DESVENLAFAXINE SUC ER 100 M: 100 | 30 days supply | Qty: 30 | Fill #1

## 2016-07-01 ENCOUNTER — Encounter: Payer: Self-pay | Admitting: Family Medicine

## 2016-07-01 ENCOUNTER — Ambulatory Visit (INDEPENDENT_AMBULATORY_CARE_PROVIDER_SITE_OTHER): Payer: 59 | Admitting: Family Medicine

## 2016-07-01 VITALS — BP 110/80 | HR 90 | Temp 99.2°F | Wt 129.8 lb

## 2016-07-01 DIAGNOSIS — J069 Acute upper respiratory infection, unspecified: Secondary | ICD-10-CM | POA: Diagnosis not present

## 2016-07-01 DIAGNOSIS — B9789 Other viral agents as the cause of diseases classified elsewhere: Secondary | ICD-10-CM

## 2016-07-01 MED ORDER — BENZONATATE 200 MG PO CAPS: 200.0000 mg | ORAL_CAPSULE | Freq: Three times a day (TID) | ORAL | 0 refills | Status: DC | PRN

## 2016-07-01 NOTE — Progress Notes (Signed)
Subjective:     Patient ID: Amanda Alvarez, female   DOB: 09-Mar-1965, 51 y.o.   MRN: [id number]  HPI Patient seen with one-week history of cough and some nasal congestion. She states her teenage sons have had very similar symptoms. Her cough has been mostly nonproductive. Cough has been fairly severe at times. She is using NyQuil at night which helps. Her daytime cough has been significant. No wheezing. No dyspnea. Nonsmoker.  Says some nasal congestion and increased fullness left maxillary sinus region. Symptoms present for about 5 days.  Past Medical History:  Diagnosis Date  . Cough 08/26/2014  . GAD (generalized anxiety disorder)   . Major depressive disorder, single episode, severe, without mention of psychotic behavior   . OCD (obsessive compulsive disorder)   . Urinary tract infection, site not specified    chronic since childhood. Has had multiple eval: cysto x 3 all with chronic inflammation with lymphocytis invasion with plasma cells. for suppresion   Past Surgical History:  Procedure Laterality Date  . CYSTOSCOPY     with uretheral stretches    reports that she has never smoked. She has never used smokeless tobacco. She reports that she drinks alcohol. She reports that she does not use drugs. family history includes Atrial fibrillation in her mother; Cancer in her other and paternal grandfather; Colon polyps in her father; Coronary artery disease in her father; Heart disease in her other and other; Hyperlipidemia in her father and mother; Hypertension in her brother; Hypothyroidism in her mother; Lung cancer in her other. No Known Allergies   Review of Systems  Constitutional: Negative for chills and fever.  HENT: Positive for congestion and sinus pressure.   Respiratory: Positive for cough.        Objective:   Physical Exam  Constitutional: She appears well-developed and well-nourished.  HENT:  Right Ear: External ear normal.  Left Ear: External ear normal.   Mouth/Throat: Oropharynx is clear and moist.  Neck: Neck supple.  Cardiovascular: Normal rate and regular rhythm.   Pulmonary/Chest: Effort normal and breath sounds normal. No respiratory distress. She has no wheezes. She has no rales.  Lymphadenopathy:    She has no cervical adenopathy.       Assessment:     Cough. Suspect acute viral bronchitis    Plan:     -Tessalon Perles 1 every 8 hours as needed for cough -Follow-up promptly for any fever or increasing shortness of breath -May continue nighttime use of NyQuil as needed -Consider Flonase or Nasacort for nasal congestive symptoms  Eulas Post MD [facility] at [facility]

## 2016-07-31 DIAGNOSIS — R300 Dysuria: Secondary | ICD-10-CM | POA: Diagnosis not present

## 2016-07-31 DIAGNOSIS — R8271 Bacteriuria: Secondary | ICD-10-CM | POA: Diagnosis not present

## 2016-07-31 MED FILL — TRIMETHOPRIM 100 MG TABLET: 100 | 90 days supply | Qty: 90 | Fill #0

## 2016-07-31 MED FILL — CEPHALEXIN 500 MG CAPSULE: 500 | 5 days supply | Qty: 10 | Fill #0

## 2016-08-07 MED FILL — DESVENLAFAXINE SUC ER 100 M: 100 | 30 days supply | Qty: 30 | Fill #2 | Status: TO

## 2016-09-11 MED FILL — DESVENLAFAXINE SUC ER 100 M: 100 | 30 days supply | Qty: 30 | Fill #0 | Status: TO

## 2016-09-11 MED FILL — NUVIGIL 150 MG TABLET: 150 | 30 days supply | Qty: 30 | Fill #0

## 2016-10-12 ENCOUNTER — Other Ambulatory Visit: Payer: Self-pay | Admitting: Family Medicine

## 2016-10-12 MED FILL — DESVENLAFAXINE SUC ER 100 M: 100 | 30 days supply | Qty: 30 | Fill #0

## 2016-10-12 MED FILL — LINZESS 145 MCG CAPSULE: 145 | 30 days supply | Qty: 30 | Fill #0

## 2016-11-05 IMAGING — CR DG CHEST 2V
2 series · 2 of 2 positions shown · non-contrast
Comparison: 03/15/2013

CLINICAL DATA: Dry cough x3 weeks

EXAM:
CHEST  2 VIEW

[w chest pa]
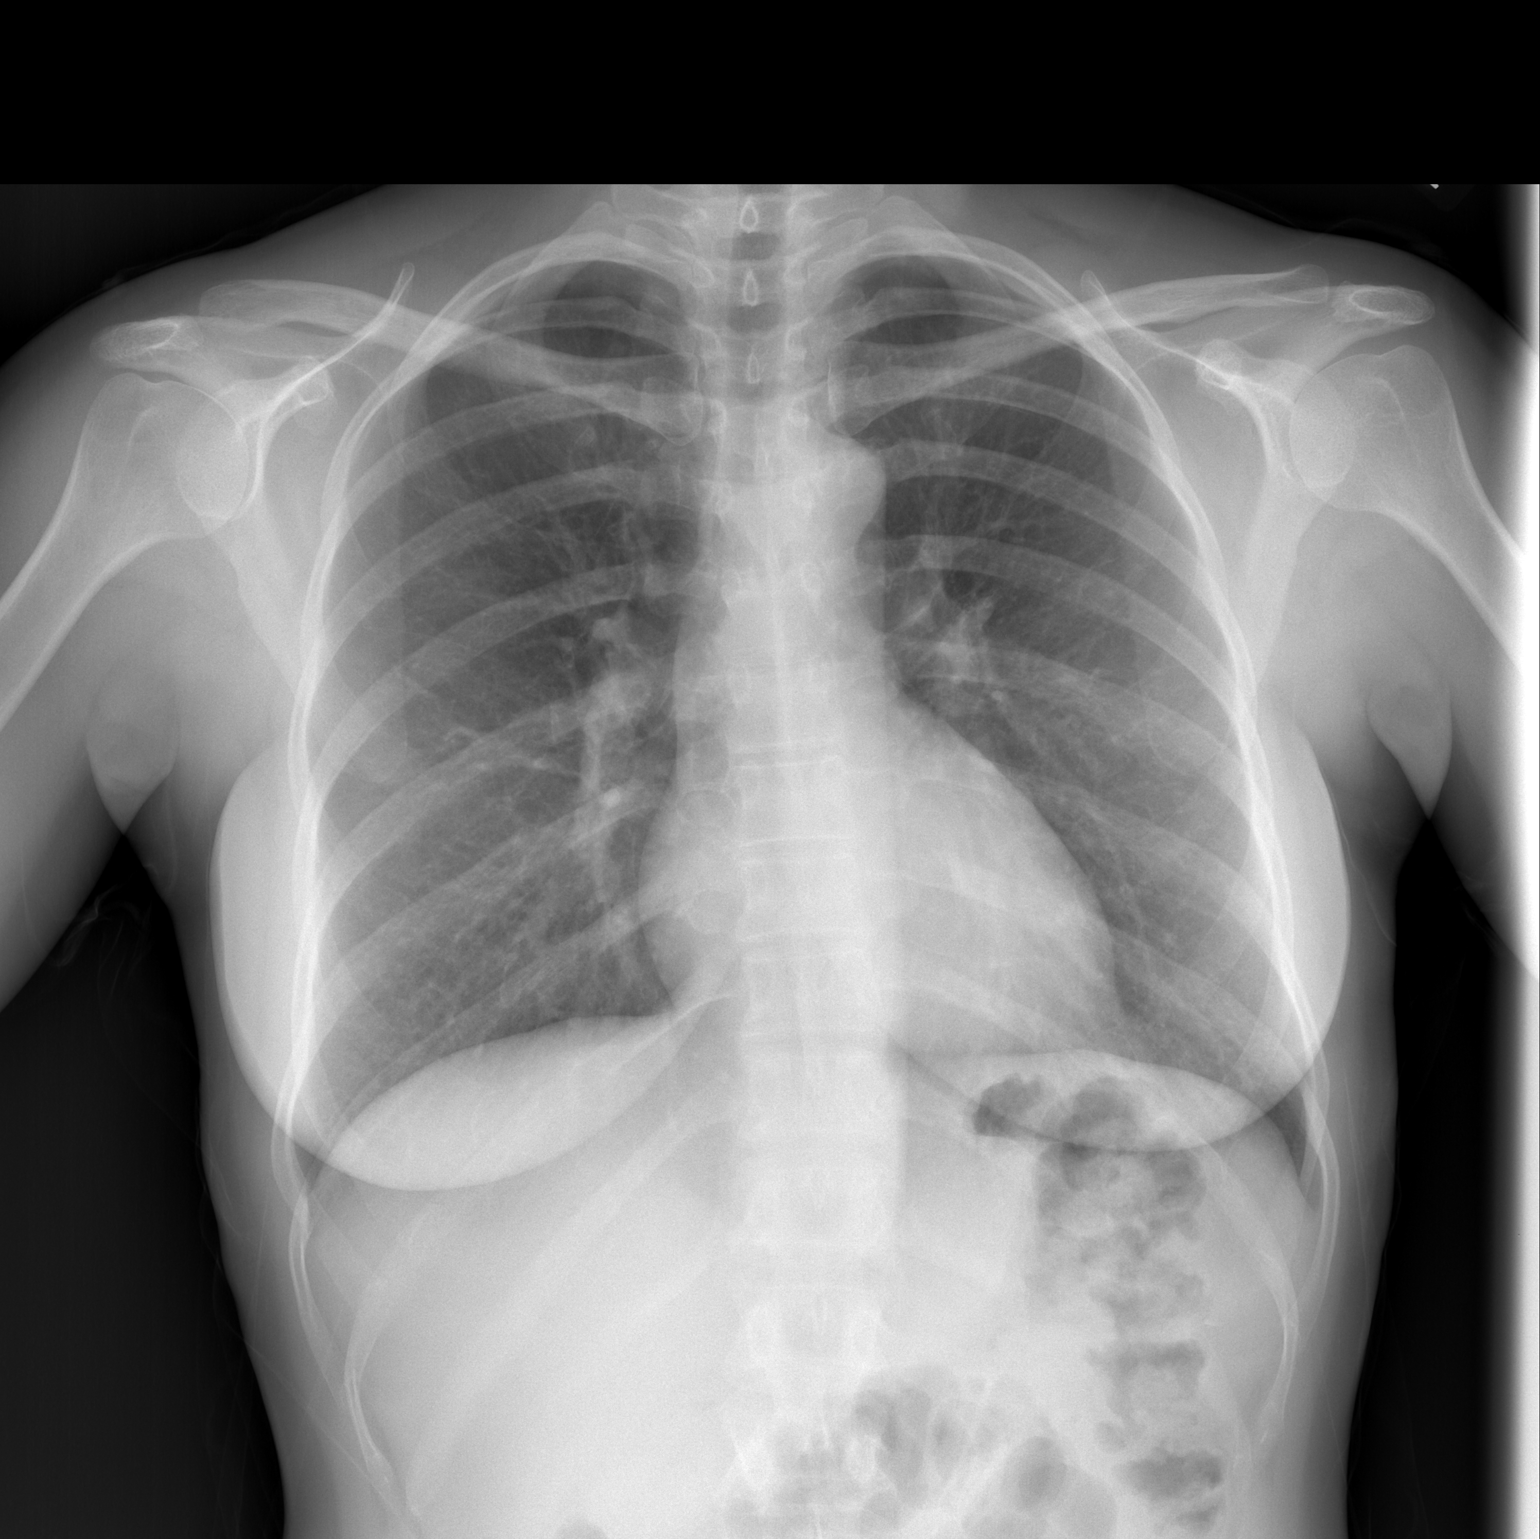

[w chest lat]
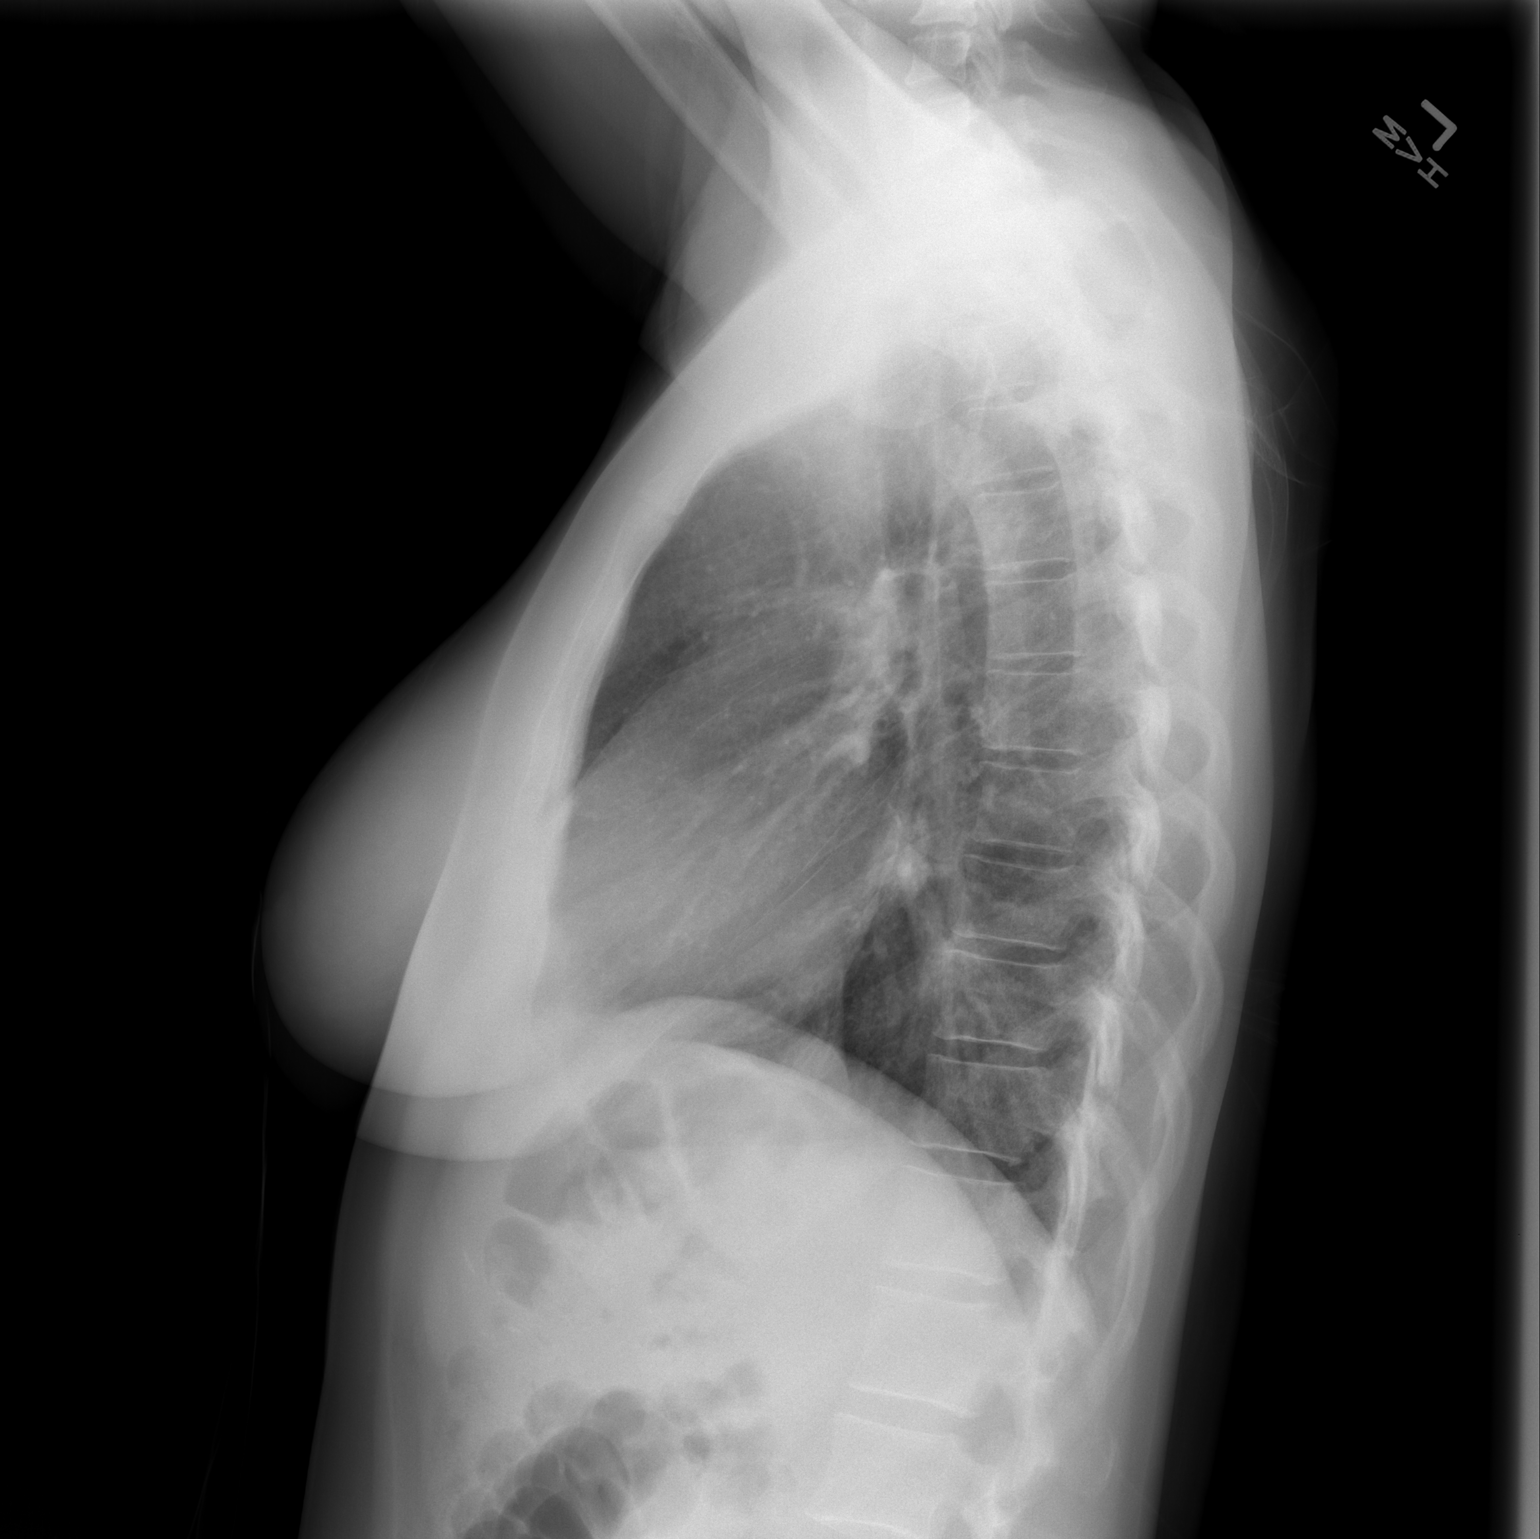

[2 of 2 positions shown; findings below may reference images not displayed]

FINDINGS: Lungs are clear.  No pleural effusion or pneumothorax.

The heart is normal in size.

Visualized osseous structures are within normal limits.
IMPRESSION: Normal chest radiographs.

## 2016-11-18 MED FILL — DESVENLAFAXINE SUC ER 100 M: 100 | 30 days supply | Qty: 30 | Fill #1

## 2016-11-18 MED FILL — metroNIDAZOLE 500 MG TABS: 500 | 7 days supply | Qty: 14 | Fill #0

## 2016-11-27 MED FILL — metroNIDAZOLE 0.75 % GEL: 0.75 | 30 days supply | Qty: 140 | Fill #0

## 2016-12-10 DIAGNOSIS — F605 Obsessive-compulsive personality disorder: Secondary | ICD-10-CM | POA: Diagnosis not present

## 2016-12-10 DIAGNOSIS — F411 Generalized anxiety disorder: Secondary | ICD-10-CM | POA: Diagnosis not present

## 2016-12-10 MED FILL — NUVIGIL 150 MG TABLET: 150 | 30 days supply | Qty: 30 | Fill #0

## 2016-12-10 MED FILL — DESVENLAFAXINE SUC ER 50 MG: 50 | 90 days supply | Qty: 90 | Fill #0

## 2017-01-06 MED FILL — NUVIGIL 150 MG TABLET: 150 | 30 days supply | Qty: 30 | Fill #1

## 2017-02-08 MED FILL — NUVIGIL 150 MG TABLET: 150 | 30 days supply | Qty: 30 | Fill #2

## 2017-02-27 IMAGING — CR DG ABDOMEN 2V
2 series · 2 of 2 positions shown · non-contrast
Comparison: 03/14/2013

CLINICAL DATA: Chronic obstipation.  Abdominal discomfort.

EXAM:
ABDOMEN - 2 VIEW

[abdomen erect]
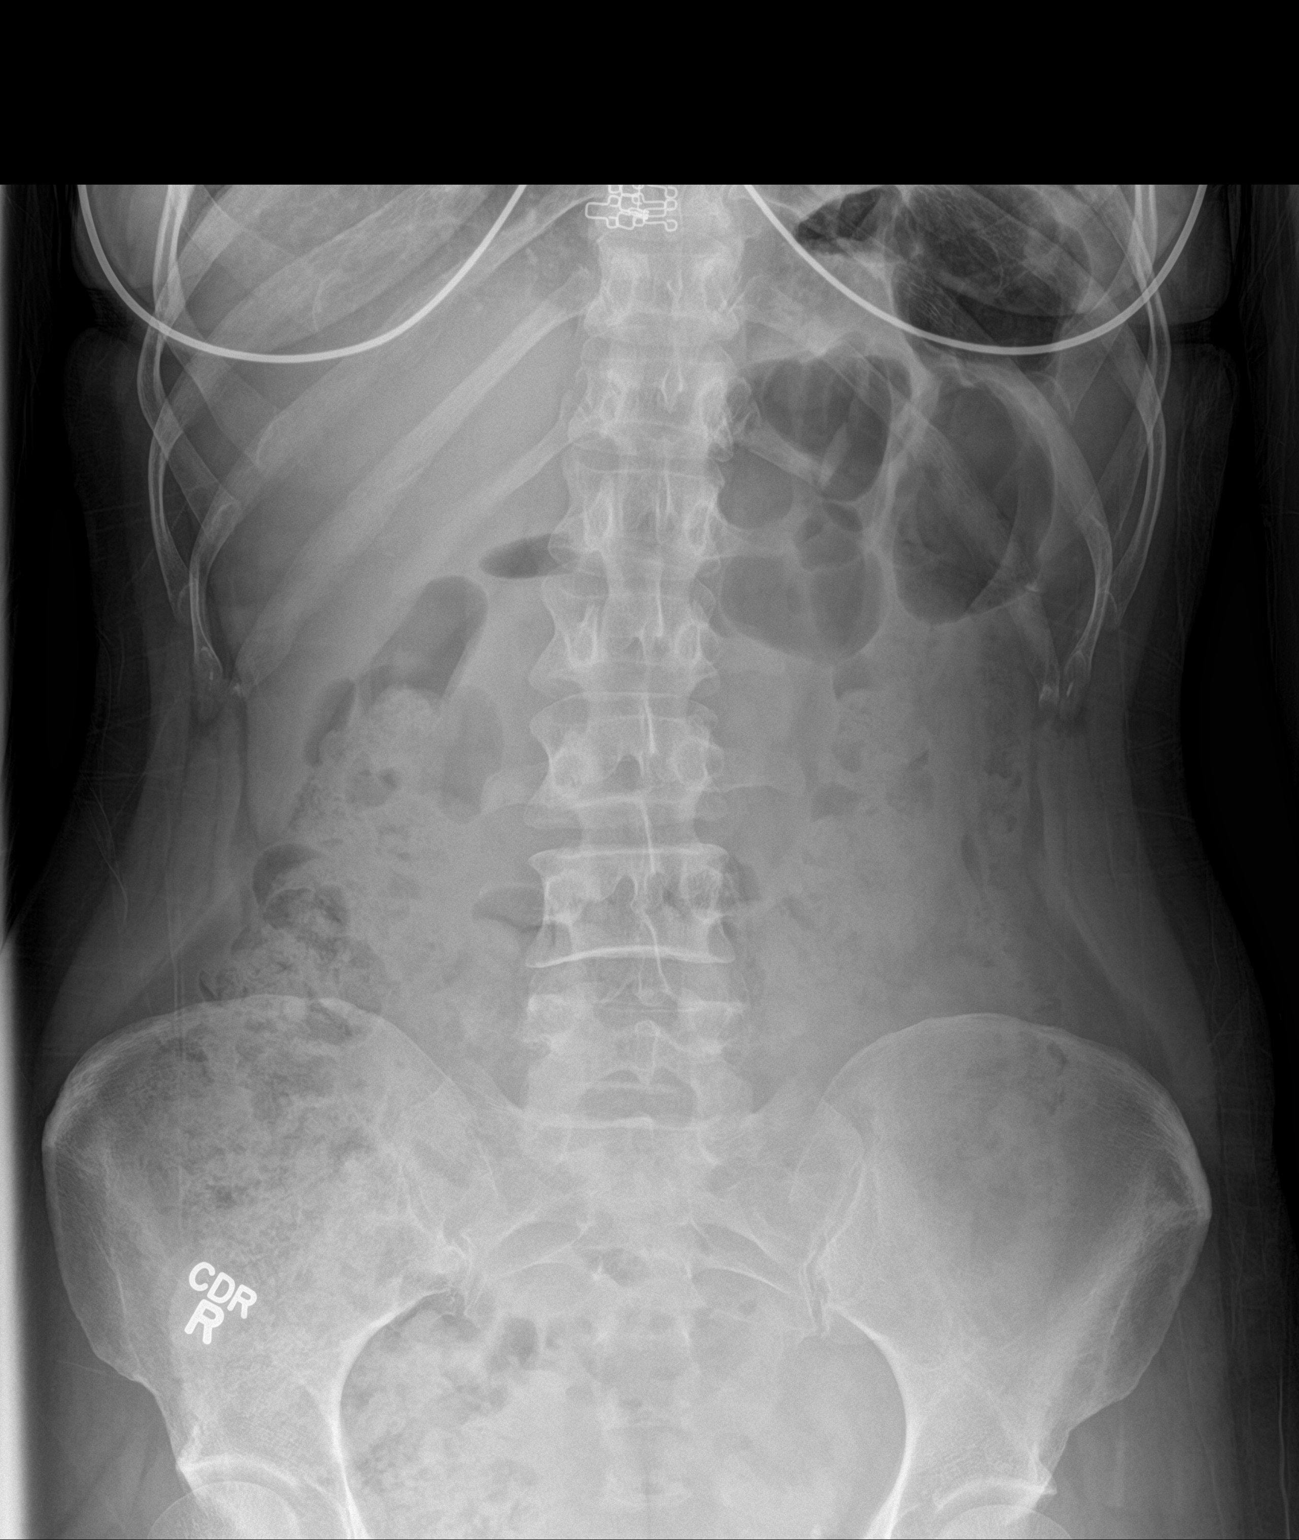

[abdomen supine]
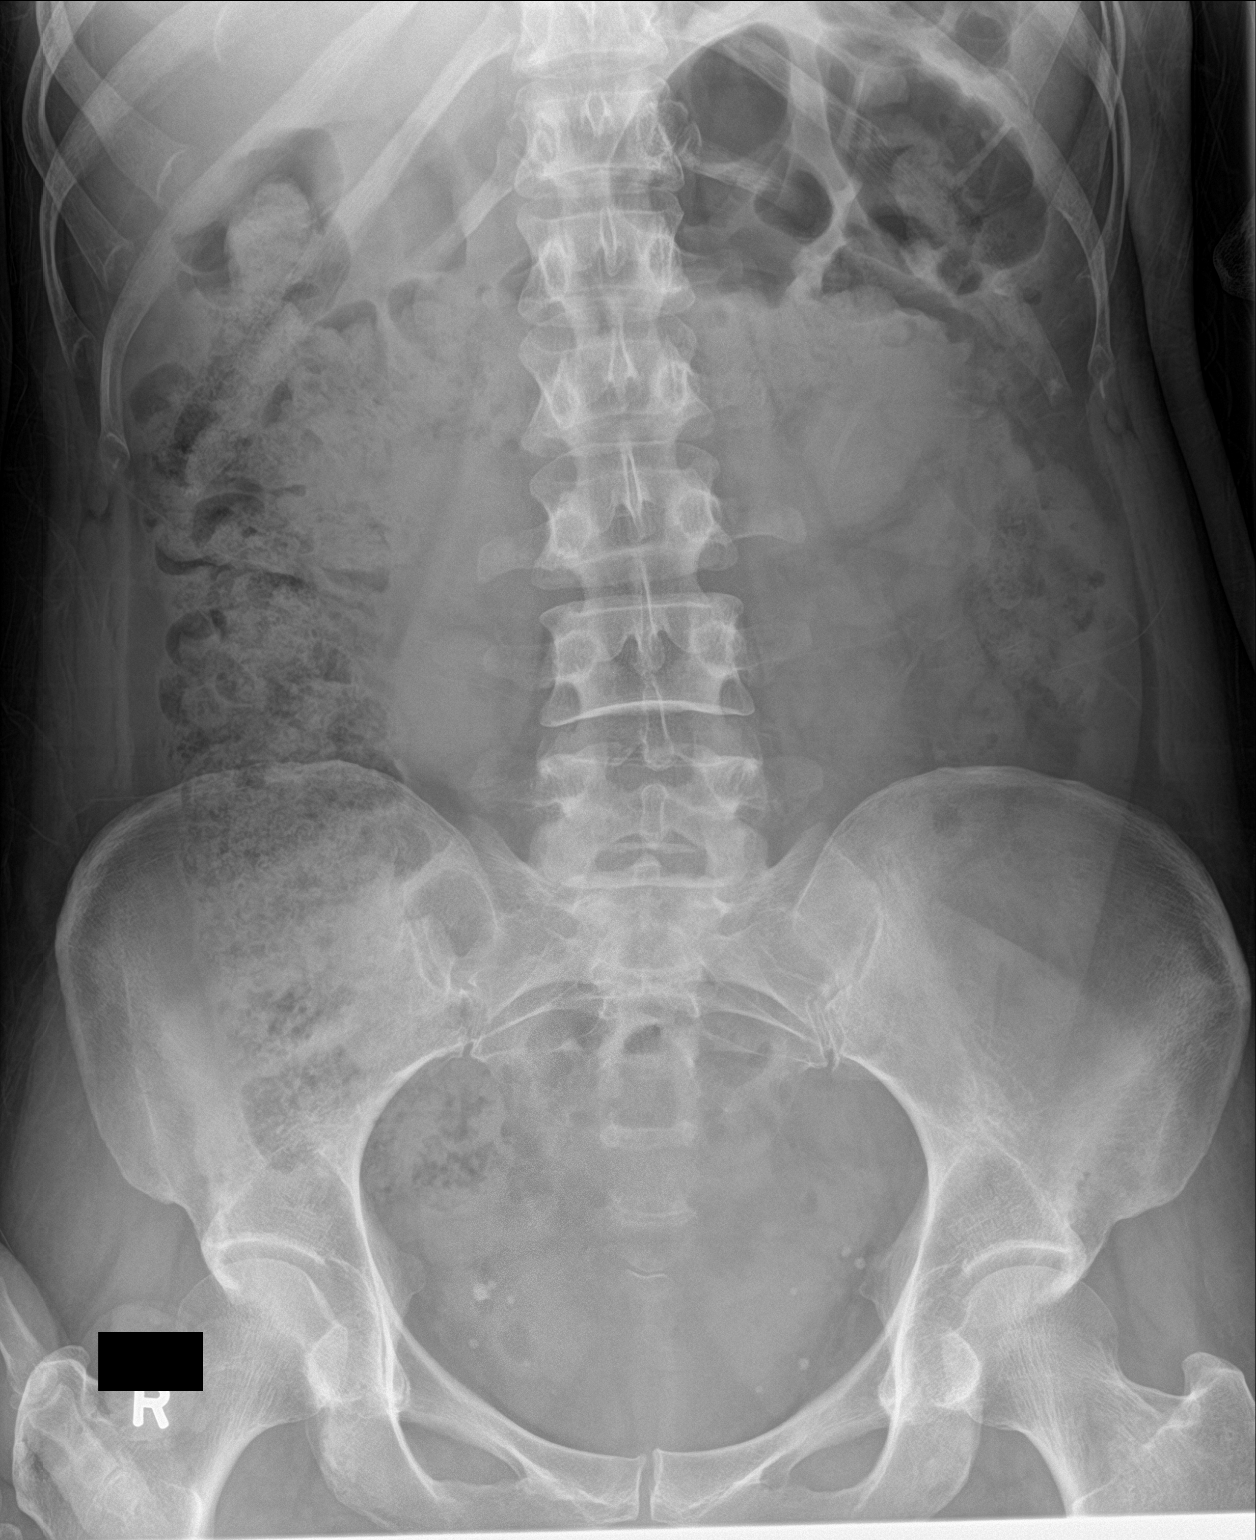

[2 of 2 positions shown; findings below may reference images not displayed]

FINDINGS: Bowel gas pattern is normal. No free air. Moderate stool in the
ascending colon. No fecal impaction. No osseous abnormality.
IMPRESSION: Benign appearing abdomen.

## 2017-03-05 ENCOUNTER — Encounter: Payer: Self-pay | Admitting: Family Medicine

## 2017-03-05 ENCOUNTER — Ambulatory Visit (INDEPENDENT_AMBULATORY_CARE_PROVIDER_SITE_OTHER): Payer: 59 | Admitting: Family Medicine

## 2017-03-05 VITALS — BP 120/76 | HR 102 | Temp 98.3°F | Ht 64.0 in | Wt 134.2 lb

## 2017-03-05 DIAGNOSIS — Z0000 Encounter for general adult medical examination without abnormal findings: Secondary | ICD-10-CM

## 2017-03-05 DIAGNOSIS — E785 Hyperlipidemia, unspecified: Secondary | ICD-10-CM | POA: Diagnosis not present

## 2017-03-05 DIAGNOSIS — Z23 Encounter for immunization: Secondary | ICD-10-CM

## 2017-03-05 MED ORDER — LINACLOTIDE 145 MCG PO CAPS: 145.0000 ug | ORAL_CAPSULE | Freq: Every day | ORAL | 5 refills | Status: DC

## 2017-03-05 NOTE — Progress Notes (Signed)
Phone: [phone]  Subjective:  Patient presents today for their annual physical. Chief complaint-noted.   See problem oriented charting- ROS- full  review of systems was completed and negative except for: leg swelling  The following were reviewed and entered/updated in epic: Past Medical History:  Diagnosis Date  . Cough 08/26/2014  . GAD (generalized anxiety disorder)   . Major depressive disorder, single episode, severe, without mention of psychotic behavior   . OCD (obsessive compulsive disorder)   . Urinary tract infection, site not specified    chronic since childhood. Has had multiple eval: cysto x 3 all with chronic inflammation with lymphocytis invasion with plasma cells. for suppresion   Patient Active Problem List   Diagnosis Date Noted  . Depression 12/13/2007    Priority: Medium  . Chronic UTI 08/22/2007    Priority: Medium  . 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 Constipation 08/13/2015    Priority: Low  . Hyperlipidemia 03/05/2017  . Anemia 08/13/2015   Past Surgical History:  Procedure Laterality Date  . CYSTOSCOPY     with uretheral stretches    Family History  Problem Relation Age of Onset  . Atrial fibrillation Mother   . Hyperlipidemia Mother   . Hypothyroidism Mother   . Coronary artery disease Father        early 35s, nonsmoker  . Hyperlipidemia Father   . Colon polyps Father   . Hypertension Brother   . Heart disease Other        dads side  . Heart disease Other        dads side  . Lung cancer Other   . Cancer Other        breast  . Cancer Paternal Grandfather   . Colon cancer Neg Hx   . Breast cancer Neg Hx     Medications- reviewed and updated Current Outpatient Medications  Medication Sig Dispense Refill  . Armodafinil (NUVIGIL) 150 MG tablet Take 150 mg by mouth daily as needed (shift work disorder).     Marland Kitchen desvenlafaxine (PRISTIQ) 50 MG 24 hr tablet Take 100 mg by mouth daily.     Marland Kitchen linaclotide (LINZESS)  145 MCG CAPS capsule Take 1 capsule (145 mcg total) by mouth daily. 30 capsule 5  . omeprazole (PRILOSEC) 40 MG capsule Take 40 mg by mouth 2 (two) times daily.    . phenazopyridine (PYRIDIUM) 200 MG tablet Take 1 tablet (200 mg total) by mouth 3 (three) times daily as needed for pain. (Patient taking differently: Take 200 mg by mouth 3 (three) times daily as needed for pain. For bladder infection) 15 tablet 0   No current facility-administered medications for this visit.     Allergies-reviewed and updated No Known Allergies  Social History   Socioeconomic History  . Marital status: Married    Spouse name: None  . Number of children: 2  . Years of education: 42  . Highest education level: None  Social Needs  . Financial resource strain: None  . Food insecurity - worry: None  . Food insecurity - inability: None  . Transportation needs - medical: None  . Transportation needs - non-medical: None  Occupational History  . Occupation: NURSE PRACTITIONER    Employer: [employer]    Comment: [employer]  . Occupation: NURSE PRACTITIONER    Employer: [employer]  Tobacco Use  . Smoking status: Never Smoker  . Smokeless tobacco: Never Used  Substance and Sexual Activity  .  Alcohol use: Yes    Alcohol/week: 0.0 - 0.6 oz  . Drug use: No  . Sexual activity: Yes    Partners: Male  Other Topics Concern  . None  Social History Narrative   Married '94 - 4 years, Divorced;remarried  '02   1 son - '05; 1 daughter - '03. 4 people in household      [name], [place] MSN, [name]       Hobbies: tennis, time with family    Objective: BP 120/76 (BP Location: Left Arm, Patient Position: Sitting, Cuff Size: Large)   Pulse (!) 102   Temp 98.3 F (36.8 C) (Oral)   Ht 5\' 4"  (1.626 m)   Wt 134 lb 3.2 oz (60.9 kg)   SpO2 98%   BMI 23.04 kg/m  Gen: NAD, resting comfortably HEENT: Mucous membranes are moist. Oropharynx normal Neck: no thyromegaly CV: RRR no murmurs  rubs or gallops Lungs: CTAB no crackles, wheeze, rhonchi Abdomen: soft/nontender/nondistended/normal bowel sounds. No rebound or guarding.  Ext: no edema Skin: warm, dry Neuro: grossly normal, moves all extremities, PERRLA  Assessment/Plan:  52 y.o. female presenting for annual physical.  Health Maintenance counseling: 1. Anticipatory guidance: Patient counseled regarding regular dental exams -q6 months, eye exams -yearly, wearing seatbelts.  2. Risk factor reduction:  Advised patient of need for regular exercise and diet rich and fruits and vegetables to reduce risk of heart attack and stroke. Exercise- tries to play tennis in the summer- last few months bene out of [employer]- but will get back to it. Diet-healthy diet/vegetarian. Healthy BMI.  Wt Readings from Last 3 Encounters:  03/05/17 134 lb 3.2 oz (60.9 kg)  07/01/16 129 lb 12.8 oz (58.9 kg)  08/13/15 126 lb (57.2 kg)  3. Immunizations/screenings/ancillary studies Immunization History  Administered Date(s) Administered  . Influenza,inj,Quad PF,6+ Mos 11/30/2013  . Influenza-Unspecified 11/20/2016   4. Cervical cancer screening- in February- get records of this and mammogram 5. Breast cancer screening-  breast exam with GYn and mammogram - in february 6. Colon cancer screening - 02/28/10- possibly 5 year due to chronic constipation- but was clear.  7. Skin cancer screening- Dr. Ledell Peoples practice- advised regular sunscreen use. Denies worrisome, changing, or new skin lesions.  8. Birth control/STD check- IUD stopped her periods- placed 2 years ago- likely a 5 year 9. Osteoporosis screening at 65- plan on at 65 at least. Takes vitamin D.   Status of chronic or acute concerns   Hyperlipidemia- told about 20 years ago- no checks within [place]  Leg swelling and hand tightness- has been noticed at work- hard to get ring off. Notes high salt diet- may be cause.   Depression- had a hard time weaning off paxil- Parrish/Mckinney. patient is  on pristiq 50mg  (down from 100mg ). No longer on lunesta 1mg  qhs prn for insomnia portion. Does use nuvigil per them as well.   Constipation- uses linzess prn- every 2 or 3 days  Has not had to use pyridium lately. In fact has not had many UTIs. Has relationship with Dr. Matilde Sprang at urology if needed.   Has some incontinence with tennis. Dr. Gertie Fey discussed potential surgery  Kegels- not diligent.   Anemic with ferritin as low as 9 in past due to GYN issues. She has had normal endoscopy and colonoscopy in the past.   1-2 year CPE  Lab/Order associations: Preventative health care - Plan: CBC, Comprehensive metabolic panel, TSH, Lipid panel  Hyperlipidemia, unspecified hyperlipidemia type - Plan: CBC, Comprehensive  metabolic panel, TSH, Lipid panel  Pending Tdap  Meds ordered this encounter  Medications  . linaclotide (LINZESS) 145 MCG CAPS capsule    Sig: Take 1 capsule (145 mcg total) by mouth daily.    Dispense:  30 capsule    Refill:  5   Return precautions advised.  Garret Reddish, MD

## 2017-03-05 NOTE — Addendum Note (Signed)
Addended by: Lyndle Herrlich on: 03/05/2017 02:37 PM   Modules accepted: Orders

## 2017-03-05 NOTE — Patient Instructions (Addendum)
Tdap today  Sign release of information at the check out desk for records from Dr. Gertie Fey- specifically need pap smear and mammogram  Sign release of information at the check out desk for colonoscopy from [place] fasting labs at [facility] on a day that is convenient for you within 2 weeks please

## 2017-03-08 MED FILL — NUVIGIL 150 MG TABLET: 150 | 30 days supply | Qty: 30 | Fill #3

## 2017-03-08 MED FILL — DESVENLAFAXINE SUC ER 50 MG: 50 | 90 days supply | Qty: 90 | Fill #1

## 2017-03-15 ENCOUNTER — Ambulatory Visit: Payer: 59 | Admitting: Family Medicine

## 2017-03-15 ENCOUNTER — Encounter: Payer: Self-pay | Admitting: Family Medicine

## 2017-03-15 VITALS — BP 118/68 | HR 53 | Temp 98.5°F | Ht 64.0 in | Wt 130.8 lb

## 2017-03-15 DIAGNOSIS — R509 Fever, unspecified: Secondary | ICD-10-CM

## 2017-03-15 DIAGNOSIS — R52 Pain, unspecified: Secondary | ICD-10-CM | POA: Diagnosis not present

## 2017-03-15 DIAGNOSIS — R05 Cough: Secondary | ICD-10-CM

## 2017-03-15 DIAGNOSIS — R059 Cough, unspecified: Secondary | ICD-10-CM

## 2017-03-15 LAB — POCT INFLUENZA A/B
INFLUENZA A, POC: POSITIVE — AB
Influenza B, POC: NEGATIVE

## 2017-03-15 MED ORDER — IPRATROPIUM BROMIDE 0.06 % NA SOLN: 2.0000 | Freq: Four times a day (QID) | NASAL | 0 refills | Status: DC

## 2017-03-15 MED ORDER — OSELTAMIVIR PHOSPHATE 75 MG PO CAPS: 75.0000 mg | ORAL_CAPSULE | Freq: Two times a day (BID) | ORAL | 0 refills | Status: DC

## 2017-03-15 MED FILL — IPRATROPIUM 0.06% SPRAY: 0.06 | 10 days supply | Qty: 15 | Fill #0

## 2017-03-15 MED FILL — OSELTAMIVIR PHOSPHATE 75 MG: 75 | 5 days supply | Qty: 10 | Fill #0

## 2017-03-15 NOTE — Progress Notes (Signed)
    Subjective:  Amanda Alvarez is a 52 y.o. female who presents today for same-day appointment with a chief complaint of cough.   HPI:  Cough, acute issue Started 2 days ago. Worsened over that time. Associated with fevers, chills, body aches. No sick contacts. Took nyquil and dayquil with advil which helped a little bit.  She has been working in the [employer] obvious sick contacts.  No other obvious alleviating or aggravating factors.  ROS: Per HPI  PMH: She reports that  has never smoked. she has never used smokeless tobacco. She reports that she drinks alcohol. She reports that she does not use drugs.  Objective:  Physical Exam: BP 118/68 (BP Location: Left Arm, Patient Position: Sitting, Cuff Size: Normal)   Pulse (!) 53   Temp 98.5 F (36.9 C) (Oral)   Ht 5\' 4"  (1.626 m)   Wt 130 lb 12.8 oz (59.3 kg)   SpO2 98%   BMI 22.45 kg/m   Gen: NAD, resting comfortably HEENT: TMs clear bilaterally.  Nasal mucosa erythematous with clear nasal discharge.  Oropharynx erythematous without exudate. CV: RRR with no murmurs appreciated Pulm: NWOB, CTAB with no crackles, wheezes, or rhonchi  Results for orders placed or performed in visit on 03/15/17 (from the past 24 hour(s))  POCT Influenza A/B     Status: Abnormal   Collection Time: 03/15/17  4:57 PM  Result Value Ref Range   Influenza A, POC Positive (A) Negative   Influenza B, POC Negative Negative     Assessment/Plan:  Cough, influenza A Rapid flu negative.  Start Tamiflu 75 mg twice daily for the next 5 days.  Also start Atrovent nasal spray for her significant amount of nasal/sinus congestion.  Encouraged good oral hydration.  Also recommended over-the-counter Tylenol and/or Motrin as needed for low-grade fever and pain.  Return precautions reviewed.  Follow-up as needed.  Algis Greenhouse. Jerline Pain, MD 03/15/2017 4:59 PM

## 2017-04-05 ENCOUNTER — Other Ambulatory Visit (INDEPENDENT_AMBULATORY_CARE_PROVIDER_SITE_OTHER): Payer: 59

## 2017-04-05 DIAGNOSIS — E785 Hyperlipidemia, unspecified: Secondary | ICD-10-CM

## 2017-04-05 DIAGNOSIS — Z0000 Encounter for general adult medical examination without abnormal findings: Secondary | ICD-10-CM | POA: Diagnosis not present

## 2017-04-05 LAB — COMPREHENSIVE METABOLIC PANEL
ALT: 11 U/L (ref 0–35)
AST: 14 U/L (ref 0–37)
Albumin: 4.2 g/dL (ref 3.5–5.2)
Alkaline Phosphatase: 45 U/L (ref 39–117)
BUN: 11 mg/dL (ref 6–23)
CHLORIDE: 105 meq/L (ref 96–112)
CO2: 28 meq/L (ref 19–32)
Calcium: 8.9 mg/dL (ref 8.4–10.5)
Creatinine, Ser: 0.58 mg/dL (ref 0.40–1.20)
GFR: 116.97 mL/min (ref >60.00)
GLUCOSE: 96 mg/dL (ref 70–99)
POTASSIUM: 4.9 meq/L (ref 3.5–5.1)
Sodium: 137 mEq/L (ref 135–145)
Total Bilirubin: 0.9 mg/dL (ref 0.2–1.2)
Total Protein: 6.8 g/dL (ref 6.0–8.3)

## 2017-04-05 LAB — CBC
HEMATOCRIT: 37.2 % (ref 36.0–46.0)
HEMOGLOBIN: 12.6 g/dL (ref 12.0–15.0)
MCHC: 33.8 g/dL (ref 30.0–36.0)
MCV: 92.5 fl (ref 78.0–100.0)
PLATELETS: 267 10*3/uL (ref 150.0–400.0)
RBC: 4.03 Mil/uL (ref 3.87–5.11)
RDW: 12.9 % (ref 11.5–15.5)
WBC: 6.2 10*3/uL (ref 4.0–10.5)

## 2017-04-05 LAB — LIPID PANEL
CHOLESTEROL: 166 mg/dL (ref 0–200)
HDL: 54.6 mg/dL (ref >39.00)
LDL CALC: 100 mg/dL — AB (ref 0–99)
NONHDL: 111.33
Total CHOL/HDL Ratio: 3
Triglycerides: 59 mg/dL (ref 0.0–149.0)
VLDL: 11.8 mg/dL (ref 0.0–40.0)

## 2017-04-05 LAB — TSH: TSH: 3.21 u[IU]/mL (ref 0.35–4.50)

## 2017-04-15 MED FILL — NUVIGIL 150 MG TABLET: 150 | 30 days supply | Qty: 30 | Fill #4

## 2017-05-21 MED FILL — NUVIGIL 150 MG TABLET: 150 | 30 days supply | Qty: 30 | Fill #5

## 2017-06-02 DIAGNOSIS — Z6821 Body mass index (BMI) 21.0-21.9, adult: Secondary | ICD-10-CM | POA: Diagnosis not present

## 2017-06-02 DIAGNOSIS — Z1231 Encounter for screening mammogram for malignant neoplasm of breast: Secondary | ICD-10-CM | POA: Diagnosis not present

## 2017-06-02 DIAGNOSIS — Z01419 Encounter for gynecological examination (general) (routine) without abnormal findings: Secondary | ICD-10-CM | POA: Diagnosis not present

## 2017-06-02 LAB — HM MAMMOGRAPHY

## 2017-06-02 MED FILL — metroNIDAZOLE 0.75 % GEL: 0.75 | 30 days supply | Qty: 70 | Fill #0

## 2017-06-30 MED FILL — DESVENLAFAXINE SUC ER 50 MG: 50 | 30 days supply | Qty: 30 | Fill #0

## 2017-07-05 DIAGNOSIS — F331 Major depressive disorder, recurrent, moderate: Secondary | ICD-10-CM | POA: Diagnosis not present

## 2017-07-05 DIAGNOSIS — G4726 Circadian rhythm sleep disorder, shift work type: Secondary | ICD-10-CM | POA: Diagnosis not present

## 2017-07-05 MED FILL — NUVIGIL 150 MG TABLET: 150 | 30 days supply | Qty: 30 | Fill #0

## 2017-08-12 MED FILL — LINZESS 145 MCG CAPSULE: 145 | 30 days supply | Qty: 30 | Fill #1

## 2017-08-23 MED FILL — NUVIGIL 150 MG TABLET: 150 | 30 days supply | Qty: 30 | Fill #1

## 2017-08-23 MED FILL — DESVENLAFAXINE SUC ER 50 MG: 50 | 30 days supply | Qty: 30 | Fill #1

## 2017-08-26 DIAGNOSIS — G4726 Circadian rhythm sleep disorder, shift work type: Secondary | ICD-10-CM | POA: Diagnosis not present

## 2017-08-26 DIAGNOSIS — F331 Major depressive disorder, recurrent, moderate: Secondary | ICD-10-CM | POA: Diagnosis not present

## 2017-09-09 DIAGNOSIS — R202 Paresthesia of skin: Secondary | ICD-10-CM | POA: Diagnosis not present

## 2017-09-09 DIAGNOSIS — L810 Postinflammatory hyperpigmentation: Secondary | ICD-10-CM | POA: Diagnosis not present

## 2017-09-28 ENCOUNTER — Encounter: Payer: Self-pay | Admitting: Family Medicine

## 2017-09-28 ENCOUNTER — Ambulatory Visit: Payer: 59 | Admitting: Family Medicine

## 2017-09-28 VITALS — BP 110/78 | HR 96 | Temp 98.7°F | Ht 64.0 in | Wt 130.0 lb

## 2017-09-28 DIAGNOSIS — K219 Gastro-esophageal reflux disease without esophagitis: Secondary | ICD-10-CM | POA: Diagnosis not present

## 2017-09-28 DIAGNOSIS — J320 Chronic maxillary sinusitis: Secondary | ICD-10-CM

## 2017-09-28 MED ORDER — SUCRALFATE 1 G PO TABS: 1.0000 g | ORAL_TABLET | Freq: Three times a day (TID) | ORAL | 3 refills | Status: DC

## 2017-09-28 MED ORDER — AMOXICILLIN-POT CLAVULANATE 875-125 MG PO TABS: 1.0000 | ORAL_TABLET | Freq: Two times a day (BID) | ORAL | 0 refills | Status: DC

## 2017-09-28 MED FILL — SUCRALFATE 1 GM TABLET: 1 | 10 days supply | Qty: 40 | Fill #0

## 2017-09-28 MED FILL — AMOX-CLAV 875-125 MG TABLET: 875-125 | 14 days supply | Qty: 28 | Fill #0

## 2017-09-28 NOTE — Assessment & Plan Note (Signed)
S: feeling pressure sensation in upper abdomen in addition of pressure up to throat. She has had to go off hot tea- seemed to trigger her- trialed and still would have issues.  Went back on prilosec 40mg  and helped minimally- 3 doses of carafate really helped A/P: Patient with recurrent GERD that responded really well to carafate- she would like to have some on hand. She knows to continue to avoid hot tea (which is disappointing for her). She will also have prilosec on hand for prn use.

## 2017-09-28 NOTE — Progress Notes (Signed)
Subjective:  Amanda Alvarez is a 52 y.o. year old very pleasant female patient who presents for/with See problem oriented charting ROS- chronic left maxillary pain. Upper left dental pain. No fever or chills. Some throat drainage.    Past Medical History-  Patient Active Problem List   Diagnosis Date Noted  . Depression 12/13/2007    Priority: Medium  . Chronic UTI 08/22/2007    Priority: Medium  . 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 Constipation 08/13/2015    Priority: Low  . GERD (gastroesophageal reflux disease) 09/28/2017  . Hyperlipidemia 03/05/2017  . Anemia 08/13/2015    Medications- reviewed and updated Current Outpatient Medications  Medication Sig Dispense Refill  . Armodafinil (NUVIGIL) 150 MG tablet Take 150 mg by mouth daily as needed (shift work disorder).     Marland Kitchen ipratropium (ATROVENT) 0.06 % nasal spray Place 2 sprays into both nostrils 4 (four) times daily. 15 mL 0  . linaclotide (LINZESS) 145 MCG CAPS capsule Take 1 capsule (145 mcg total) by mouth daily. 30 capsule 5  . omeprazole (PRILOSEC) 40 MG capsule Take 40 mg by mouth 2 (two) times daily.    . phenazopyridine (PYRIDIUM) 200 MG tablet Take 1 tablet (200 mg total) by mouth 3 (three) times daily as needed for pain. (Patient taking differently: Take 200 mg by mouth 3 (three) times daily as needed for pain. For bladder infection) 15 tablet 0  . amoxicillin-clavulanate (AUGMENTIN) 875-125 MG tablet Take 1 tablet by mouth 2 (two) times daily. 28 tablet 0  . sucralfate (CARAFATE) 1 g tablet Take 1 tablet (1 g total) by mouth 4 (four) times daily -  with meals and at bedtime. 40 tablet 3   No current facility-administered medications for this visit.     Objective: BP 110/78 (BP Location: Left Arm, Patient Position: Sitting, Cuff Size: Normal)   Pulse 96   Temp 98.7 F (37.1 C) (Oral)   Ht 5\' 4"  (1.626 m)   Wt 130 lb (59 kg)   SpO2 96%   BMI 22.31 kg/m  Gen: NAD, resting  comfortably Nasal turbinates minimal erythema and no drainage. Mucous membranes are moist. No sinus tenderness CV: RRR no murmurs rubs or gallops Lungs: CTAB no crackles, wheeze, rhonchi Ext: no edema Skin: warm, dry  Assessment/Plan:  Chronic left maxillary sinusitis S: minimal sinus drainage or congestion, minimal drainage. Every morning wakes up with headache in maxillary area and behind left eye. Has tried nasal saline. Been going on for months but is now daily. Has tried sudafed with tylenol- helps the eadache but feels burning sensation. Takes tylenol several times a day. Having some left upper dental pain- bought mouth guard to see if that would help.   atrovent didn't help very much back when had the flu A/P: I am concerned for Chronic left maxillary sinusitis with continued left maxillary pain, dental pain. We discussed possible CT of sinuses but opted for 10-14 day course of augmentin as first step. If no improvement or worsens could get CT or refer to ENT- she will let me know how she does.   GERD (gastroesophageal reflux disease) S: feeling pressure sensation in upper abdomen in addition of pressure up to throat. She has had to go off hot tea- seemed to trigger her- trialed and still would have issues.  Went back on prilosec 40mg  and helped minimally- 3 doses of carafate really helped A/P: Patient with recurrent GERD that responded really well to carafate-  she would like to have some on hand. She knows to continue to avoid hot tea (which is disappointing for her). She will also have prilosec on hand for prn use.    Meds ordered this encounter  Medications  . sucralfate (CARAFATE) 1 g tablet    Sig: Take 1 tablet (1 g total) by mouth 4 (four) times daily -  with meals and at bedtime.    Dispense:  40 tablet    Refill:  3  . amoxicillin-clavulanate (AUGMENTIN) 875-125 MG tablet    Sig: Take 1 tablet by mouth 2 (two) times daily.    Dispense:  28 tablet    Refill:  0   Return  precautions advised.  Garret Reddish, MD

## 2017-09-28 NOTE — Patient Instructions (Addendum)
Lets try augmentin for 10 days. If improving but not gone can use full 14 days. If no improvement at all after 10 days can stop. We can either do CT sinuses to confirm at that point or refer to ENT if you prefer  Refilled carafate

## 2017-10-06 ENCOUNTER — Telehealth: Payer: Self-pay | Admitting: Family Medicine

## 2017-10-06 DIAGNOSIS — J3489 Other specified disorders of nose and nasal sinuses: Secondary | ICD-10-CM

## 2017-10-06 MED ORDER — SUMATRIPTAN SUCCINATE 25 MG PO TABS: 25.0000 mg | ORAL_TABLET | ORAL | 0 refills | Status: DC | PRN

## 2017-10-06 MED FILL — SUMATRIPTAN SUCC 25 MG TAB: 25 | 20 days supply | Qty: 10 | Fill #0

## 2017-10-06 NOTE — Telephone Encounter (Signed)
From patient: "Good Morning,  I wanted to follow up with you regarding my headaches. I am still waking up every morning with a terrible headache. I am taking Tylenol / aleve around the clock and that dulls the pain throughout the day. The headache is still mainly left sided, involving maxillary area, left eye and now left forehead. You mentioned a CT scan and I think at this point I am certainly interested in going ahead with it.   In the meantime I would like to hear your thoughts about something. 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 What are you feelings on this? If I take it and the headache goes away will that mean that it is vascular in nature?   Thanks so much,  Amanda Alvarez "  I sent the following back to patient "Lets try imitrex. I sent this into your pharmacy. If this resolves your headaches- then great its much more likely to be migraines. If this doesn't help then let me know and I will order the CT sinuses. "

## 2017-10-07 NOTE — Telephone Encounter (Signed)
Slight improvement on imitrex. She wants to get CT sinuses

## 2017-10-07 NOTE — Addendum Note (Signed)
Addended by: Marin Olp on: 10/07/2017 01:02 PM   Modules accepted: Orders

## 2017-10-08 ENCOUNTER — Encounter: Payer: Self-pay | Admitting: Family Medicine

## 2017-10-14 ENCOUNTER — Encounter: Payer: Self-pay | Admitting: Family Medicine

## 2017-10-27 MED FILL — NUVIGIL 150 MG TABLET: 150 | 30 days supply | Qty: 30 | Fill #2

## 2017-11-04 MED FILL — TRINTELLIX 10 MG TABLET: 10 | 30 days supply | Qty: 30 | Fill #0

## 2017-11-22 ENCOUNTER — Ambulatory Visit
Admission: RE | Admit: 2017-11-22 | Discharge: 2017-11-22 | Disposition: A | Discharge status: Home / Self Care | Payer: 59 | Source: Ambulatory Visit | Attending: Family Medicine | Admitting: Family Medicine

## 2017-11-22 DIAGNOSIS — J3489 Other specified disorders of nose and nasal sinuses: Secondary | ICD-10-CM

## 2017-11-22 DIAGNOSIS — J320 Chronic maxillary sinusitis: Secondary | ICD-10-CM | POA: Diagnosis not present

## 2017-11-25 DIAGNOSIS — N951 Menopausal and female climacteric states: Secondary | ICD-10-CM | POA: Diagnosis not present

## 2017-11-26 ENCOUNTER — Telehealth: Payer: Self-pay | Admitting: Family Medicine

## 2017-11-26 NOTE — Telephone Encounter (Signed)
Amanda Craze, NP  Marin Olp, MD        Yes it was left sided pain but lately more behind my eyeballs and forehead. I had hormone levels drawn with GYN today. If those are normal and can't blame headache on being perimenopausal then I have one more thought to explore. Trintillex could be responsible.. I started it sometime around time headaches started. I was on Pristiq but it was changed to Trintillex and I may not be tolerating it. I don't know, just a thought.   I still have some imitrex left, the current dosage works fine when I do have to take it.   I will let you know if I can't tie headaches to hormones or Trintillex. I feel certain than we can find the culprit as these headaches started only in last couple of months.   Thanks again.  Amanda Alvarez   Previous Messages    ----- Message -----  From: Marin Olp, MD  Sent: 11/24/2017  2:53 PM EDT  To: Amanda Craze, NP  Subject: RE: Me                      I agree- unlikely related to sinuses. Glad the headaches are better overall!   THis was my note attached to the imaging  "Your sinus issues were on the left side if I am recalling correctly- there wasn't really anything to explain the pain on that side. On the right there is some mild mucosal thickening but rest of sinuses are clear. There are some changes near upper right posterior second molar- would be reasonable to ask your dentist about this CT.   This points [place] back more towards migraines again. Would you like [place] to trial a higher dose of imitrex or refer to neurology? im open to either."  ----- Message -----  From: Amanda Craze, NP  Sent: 11/24/2017  1:36 PM EDT  To: Marin Olp, MD  Subject: RE: Me                      Hi, I saw my sinus CT scan results,minimal thickening. I doubt the cause for the headaches but if you feel differently then please let me know.  I still have frequent frontal headaches but not  as severe and manageable for the most part with Tylenol. It occurred to me that I may be perimenopausal with headaches. My GYN mentioned checking levels and starting HRT when I saw him last in Feb. I did contact him a couple of days ago and will be going for labs to check hormone levels. Thanks for your help. I'll shoot you a staff message after seeing GYN and let you know what he thinks.  Thanks for everything  Amanda Alvarez.    ----- Message -----  From: Marin Olp, MD  Sent: 10/06/2017  1:21 PM EDT  To: Amanda Craze, NP  Subject: RE: Me                      Amanda Alvarez, [place] try imitrex. I sent this into your pharmacy. If this resolves your headaches- then great its much more likely to be migraines. If this doesn't help then let me know and I will order the CT sinuses   Amanda Alvarez   ----- Message -----  From: Amanda Craze, NP  Sent: 10/06/2017  9:25 AM EDT  To: Amanda Alvarez  Amanda Channel, MD  Subject: Me                        Good Morning,  I wanted to follow up with you regarding my headaches. I am still waking up every morning with a terrible headache. I am taking Tylenol / aleve around the clock and that dulls the pain throughout the day. The headache is still mainly left sided, involving maxillary area, left eye and now left forehead. You mentioned a CT scan and I think at this point I am certainly interested in going ahead with it.   In the meantime I would like to hear your thoughts about something. 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 What are you feelings on this? If I take it and the headache goes away will that mean that it is vascular in nature?   Thanks so much,  [employer]

## 2017-11-29 DIAGNOSIS — G4726 Circadian rhythm sleep disorder, shift work type: Secondary | ICD-10-CM | POA: Diagnosis not present

## 2017-11-29 DIAGNOSIS — F331 Major depressive disorder, recurrent, moderate: Secondary | ICD-10-CM | POA: Diagnosis not present

## 2017-11-29 MED FILL — buPROPion HCL ER (XL) 150 M: 150 | 30 days supply | Qty: 60 | Fill #0

## 2017-12-16 DIAGNOSIS — N951 Menopausal and female climacteric states: Secondary | ICD-10-CM | POA: Diagnosis not present

## 2017-12-16 MED FILL — ESTRADIOL-NORETHINDRONE ACE: 1-0.5 | 28 days supply | Qty: 28 | Fill #0

## 2017-12-21 MED FILL — NUVIGIL 150 MG TABLET: 150 | 30 days supply | Qty: 30 | Fill #0

## 2018-01-26 MED FILL — buPROPion HCL ER (XL) 150 M: 150 | 30 days supply | Qty: 60 | Fill #0

## 2018-01-31 MED FILL — NUVIGIL 150 MG TABLET: 150 | 30 days supply | Qty: 30 | Fill #1

## 2018-02-03 MED FILL — ESTRADIOL-NORETHINDRONE ACE: 1-0.5 | 28 days supply | Qty: 28 | Fill #0

## 2018-02-03 MED FILL — LINZESS 145 MCG CAPSULE: 145 | 30 days supply | Qty: 30 | Fill #0

## 2018-02-10 DIAGNOSIS — J029 Acute pharyngitis, unspecified: Secondary | ICD-10-CM | POA: Diagnosis not present

## 2018-03-02 ENCOUNTER — Encounter: Payer: Self-pay | Admitting: Family Medicine

## 2018-03-02 ENCOUNTER — Ambulatory Visit: Payer: 59 | Admitting: Family Medicine

## 2018-03-02 VITALS — BP 108/68 | HR 92 | Temp 99.1°F | Ht 64.0 in | Wt 119.2 lb

## 2018-03-02 DIAGNOSIS — R05 Cough: Secondary | ICD-10-CM | POA: Diagnosis not present

## 2018-03-02 DIAGNOSIS — R059 Cough, unspecified: Secondary | ICD-10-CM

## 2018-03-02 MED ORDER — IPRATROPIUM BROMIDE 0.06 % NA SOLN: 2.0000 | Freq: Four times a day (QID) | NASAL | 0 refills | Status: DC

## 2018-03-02 MED ORDER — BENZONATATE 200 MG PO CAPS: 200.0000 mg | ORAL_CAPSULE | Freq: Two times a day (BID) | ORAL | 0 refills | Status: DC | PRN

## 2018-03-02 MED ORDER — AZITHROMYCIN 250 MG PO TABS: ORAL_TABLET | ORAL | 0 refills | Status: DC

## 2018-03-02 MED ORDER — METHYLPREDNISOLONE ACETATE 80 MG/ML IJ SUSP
80.0000 mg | Freq: Once | INTRAMUSCULAR | Status: AC
  Administered 2018-03-02: 80 mg via INTRAMUSCULAR

## 2018-03-02 MED FILL — IPRATROPIUM 0.06% SPRAY: 0.06 | 10 days supply | Qty: 15 | Fill #0

## 2018-03-02 MED FILL — BENZONATATE 200 MG CAPS: 200 | 10 days supply | Qty: 20 | Fill #0

## 2018-03-02 MED FILL — AZITHROMYCIN 250 MG TABLET: 250 | 5 days supply | Qty: 6 | Fill #0

## 2018-03-02 NOTE — Progress Notes (Signed)
0

## 2018-03-02 NOTE — Progress Notes (Signed)
   Subjective:  Amanda Alvarez is a 53 y.o. female who presents today for same-day appointment with a chief complaint of cough.   HPI:  Cough, Acute problem Started 3 weeks ago. Was worsening for about 10 days, but has been stable since then.  Had associated body aches and sore throat.  Went to urgent care and had strep and flu testing which was negative.  Currently has some nasal congestion and postnasal drip.  Cough is occasionally productive of clear sputum.  She has tried over-the-counter treatments with modest improvement.  No other obvious alleviating or aggravating factors.     ROS: Per HPI  PMH: She reports that she has never smoked. She has never used smokeless tobacco. She reports current alcohol use. She reports that she does not use drugs.  Objective:  Physical Exam: BP 108/68 (BP Location: Left Arm, Patient Position: Sitting, Cuff Size: Normal)   Pulse 92   Temp 99.1 F (37.3 C) (Oral)   Ht 5\' 4"  (1.626 m)   Wt 119 lb 4 oz (54.1 kg)   SpO2 98%   BMI 20.47 kg/m   Gen: NAD, resting comfortably HEENT: Right TM clear.  Left TM with clear effusion.  OP erythematous with no exudate.  Nasal mucosa erythematous and boggy laterally. CV: RRR with no murmurs appreciated Pulm: NWOB, CTAB with no crackles, wheezes, or rhonchi  Assessment/Plan:  Cough Given that symptoms have been persistent for the past 3 weeks, will start Z-Pak today.  Start Atrovent for nasal congestion and postnasal drip.  Start Tessalon for cough.  Will give 80 mg IM Depo-Medrol as well.  Recommended good oral hydration.  Discussed reasons to return to care.  Follow-up as needed.  Algis Greenhouse. Jerline Pain, MD 03/02/2018 10:22 AM

## 2018-03-02 NOTE — Patient Instructions (Addendum)
Start the atrovent and zpack.  Start tessalon for your cough.  Please stay well hydrated.  You can take tylenol and/or motrin as needed for low grade fever and pain.  Please let me know if your symptoms worsen or fail to improve.  Take care, Dr Jerline Pain

## 2018-03-22 MED FILL — NUVIGIL 150 MG TABLET: 150 | 30 days supply | Qty: 30 | Fill #0

## 2018-03-25 ENCOUNTER — Encounter: Payer: Self-pay | Admitting: Family Medicine

## 2018-03-25 NOTE — Telephone Encounter (Signed)
Please advise if pt needs an ov? 

## 2018-03-28 MED ORDER — ZOLPIDEM TARTRATE 5 MG PO TABS: 5.0000 mg | ORAL_TABLET | Freq: Every evening | ORAL | 1 refills | Status: DC | PRN

## 2018-03-28 MED FILL — ESTRADIOL-NORETHINDRONE ACE: 1-0.5 | 28 days supply | Qty: 28 | Fill #0

## 2018-03-29 MED FILL — ZOLPIDEM TARTRATE 5 MG TAB: 5 | 30 days supply | Qty: 30 | Fill #0

## 2018-04-18 DIAGNOSIS — G4726 Circadian rhythm sleep disorder, shift work type: Secondary | ICD-10-CM | POA: Diagnosis not present

## 2018-04-18 DIAGNOSIS — F331 Major depressive disorder, recurrent, moderate: Secondary | ICD-10-CM | POA: Diagnosis not present

## 2018-04-20 MED FILL — buPROPion HCL ER (XL) 150 M: 150 | 30 days supply | Qty: 60 | Fill #0 | Status: TO

## 2018-04-20 MED FILL — NUVIGIL 150 MG TABLET: 150 | 30 days supply | Qty: 30 | Fill #0 | Status: TO

## 2018-05-25 ENCOUNTER — Other Ambulatory Visit: Payer: Self-pay | Admitting: Family Medicine

## 2018-05-25 MED FILL — ESTRADIOL-NORETHINDRONE ACE: 1-0.5 | 28 days supply | Qty: 28 | Fill #1

## 2018-05-25 MED FILL — buPROPion HCL ER (XL) 150 M: 150 | 30 days supply | Qty: 60 | Fill #0

## 2018-05-25 MED FILL — ZOLPIDEM TARTRATE 5 MG TAB: 5 | 30 days supply | Qty: 30 | Fill #1

## 2018-05-25 MED FILL — NUVIGIL 150 MG TABLET: 150 | 30 days supply | Qty: 30 | Fill #0

## 2018-05-26 ENCOUNTER — Other Ambulatory Visit: Payer: Self-pay

## 2018-05-26 MED ORDER — LINACLOTIDE 145 MCG PO CAPS: 145.0000 ug | ORAL_CAPSULE | Freq: Every day | ORAL | 5 refills | Status: DC

## 2018-05-26 MED FILL — LINZESS 145 MCG CAPSULE: 145 | 30 days supply | Qty: 30 | Fill #0

## 2018-05-26 NOTE — Telephone Encounter (Signed)
Requested medication (s) are due for refill today - past due  Requested medication (s) are on the active medication list -yes  Future visit scheduled -no  Last refill: 03/05/17  Notes to clinic: Patient is requesting a medication that may be out of date-(although refill criteria met) attempt to call patient has been made. Sent for PCP review  Requested Prescriptions  Pending Prescriptions Disp Refills   LINZESS 145 MCG CAPS capsule [Pharmacy Med Name: LINZESS 145 MCG CAPSULE 145 CAP] 30 capsule 5    Sig: TAKE 1 CAPSULE BY MOUTH DAILY.     Gastroenterology: Irritable Bowel Syndrome Passed - 05/26/2018  8:59 AM      Passed - Valid encounter within last 12 months    Recent Outpatient Visits          2 months ago Cough   [place] [facility], [place], MD   8 months ago Gastroesophageal reflux disease without esophagitis   [facility], Brayton Mars, MD   1 year ago Cough   [facility], Algis Greenhouse, MD   1 year ago Preventative health care   [place] [facility], Brayton Mars, MD   1 year ago Viral URI with cough   [facility] [place], MD              Requested Prescriptions  Pending Prescriptions Disp Refills   LINZESS 145 MCG CAPS capsule [Pharmacy Med Name: LINZESS 145 MCG CAPSULE 145 CAP] 30 capsule 5    Sig: TAKE 1 CAPSULE BY MOUTH DAILY.     Gastroenterology: Irritable Bowel Syndrome Passed - 05/26/2018  8:59 AM      Passed - Valid encounter within last 12 months    Recent Outpatient Visits          2 months ago Cough   [facility], [place], MD   8 months ago Gastroesophageal reflux disease without esophagitis   [facility] Reg[facility], Brayton Mars, MD   1 year ago Cough   [facility], Algis Greenhouse, MD   1 year ago Preventative health care   [place] [facility], Brayton Mars, MD   1 year ago Viral URI with cough   [facility], Alinda Sierras, MD

## 2018-05-26 NOTE — Telephone Encounter (Signed)
Refill order Linzess 144mcg #30/5 Last fill 03/05/17 Last ov 03/02/18

## 2018-05-26 NOTE — Telephone Encounter (Signed)
This Rx has not been filled in a year. Called pt to schedule an appt.for virtual visit but no answer. Will call back shortly. CRM created!

## 2018-05-26 NOTE — Telephone Encounter (Signed)
See note, 5 refills remaining.

## 2018-07-08 MED FILL — buPROPion HCL ER (XL) 150 M: 150 | 30 days supply | Qty: 60 | Fill #1

## 2018-07-08 MED FILL — ESTRADIOL-NORETHINDRONE ACE: 1-0.5 | 28 days supply | Qty: 28 | Fill #2

## 2018-07-12 MED FILL — valACYclovir HCL 1 GM TABS: 1 | 10 days supply | Qty: 20 | Fill #0

## 2018-08-10 MED FILL — NUVIGIL 150 MG TABLET: 150 | 30 days supply | Qty: 30 | Fill #0

## 2018-09-13 MED FILL — buPROPion HCL ER (XL) 150 M: 150 | 30 days supply | Qty: 60 | Fill #2

## 2018-09-13 MED FILL — ESTRADIOL-NORETHINDRONE ACE: 1-0.5 | 28 days supply | Qty: 28 | Fill #3

## 2018-09-15 MED FILL — NUVIGIL 150 MG TABLET: 150 | 30 days supply | Qty: 30 | Fill #0

## 2018-09-28 MED FILL — NUVIGIL 150 MG TABLET: 150 | 30 days supply | Qty: 30 | Fill #0

## 2018-10-14 DIAGNOSIS — Z681 Body mass index (BMI) 19 or less, adult: Secondary | ICD-10-CM | POA: Diagnosis not present

## 2018-10-14 DIAGNOSIS — Z13820 Encounter for screening for osteoporosis: Secondary | ICD-10-CM | POA: Diagnosis not present

## 2018-10-14 DIAGNOSIS — F5104 Psychophysiologic insomnia: Secondary | ICD-10-CM | POA: Diagnosis not present

## 2018-10-14 DIAGNOSIS — N959 Unspecified menopausal and perimenopausal disorder: Secondary | ICD-10-CM | POA: Diagnosis not present

## 2018-10-14 DIAGNOSIS — Z1231 Encounter for screening mammogram for malignant neoplasm of breast: Secondary | ICD-10-CM | POA: Diagnosis not present

## 2018-10-14 DIAGNOSIS — Z01419 Encounter for gynecological examination (general) (routine) without abnormal findings: Secondary | ICD-10-CM | POA: Diagnosis not present

## 2018-10-14 MED FILL — ESTRADIOL-NORETHINDRONE ACE: 1-0.5 | 28 days supply | Qty: 28 | Fill #0

## 2018-10-14 MED FILL — GABAPENTIN 100 MG CAPSULE: 100 | 30 days supply | Qty: 30 | Fill #0

## 2018-10-17 ENCOUNTER — Other Ambulatory Visit: Payer: Self-pay | Admitting: Obstetrics and Gynecology

## 2018-10-17 DIAGNOSIS — R928 Other abnormal and inconclusive findings on diagnostic imaging of breast: Secondary | ICD-10-CM

## 2018-10-21 ENCOUNTER — Ambulatory Visit
Admission: RE | Admit: 2018-10-21 | Discharge: 2018-10-21 | Disposition: A | Discharge status: Home / Self Care | Payer: 59 | Source: Ambulatory Visit | Attending: Obstetrics and Gynecology | Admitting: Obstetrics and Gynecology

## 2018-10-21 ENCOUNTER — Other Ambulatory Visit: Payer: Self-pay

## 2018-10-21 DIAGNOSIS — R922 Inconclusive mammogram: Secondary | ICD-10-CM | POA: Diagnosis not present

## 2018-10-21 DIAGNOSIS — N6002 Solitary cyst of left breast: Secondary | ICD-10-CM | POA: Diagnosis not present

## 2018-10-21 DIAGNOSIS — R928 Other abnormal and inconclusive findings on diagnostic imaging of breast: Secondary | ICD-10-CM

## 2018-11-03 DIAGNOSIS — F3181 Bipolar II disorder: Secondary | ICD-10-CM | POA: Diagnosis not present

## 2018-11-03 DIAGNOSIS — F3341 Major depressive disorder, recurrent, in partial remission: Secondary | ICD-10-CM | POA: Diagnosis not present

## 2018-11-03 MED FILL — ESZOPICLONE 3 MG TABS: 3 | 30 days supply | Qty: 30 | Fill #0

## 2018-11-03 MED FILL — buPROPion HCL ER (XL) 150 M: 150 | 30 days supply | Qty: 30 | Fill #0

## 2018-11-04 MED FILL — NUVIGIL 150 MG TABLET: 150 | 30 days supply | Qty: 30 | Fill #0

## 2018-11-21 NOTE — Patient Instructions (Addendum)
Health Maintenance Due  Topic Date Due  . INFLUENZA VACCINE -today 09/17/2018   Sign release of information at the check out desk for mammogram and bone density and pap smear from Dr. Gertie Fey   Please stop by lab before you go If you do not have mychart- we will call you about results within 5 business days of [place] receiving them.  If you have mychart- we will send your results within 3 business days of [place] receiving them.  If abnormal or we want to clarify a result, we will call or mychart you to make sure you receive the message.  If you have questions or concerns or don't hear within 5-7 days, please send [place] a message or call us.

## 2018-11-21 NOTE — Progress Notes (Signed)
Phone: [phone]   Subjective:  Patient presents today for their annual physical. Chief complaint-noted.   See problem oriented charting- ROS- full  review of systems was completed and negative except for: congestion, sore throat, leg swelling, and HA  The following were reviewed and entered/updated in epic: Past Medical History:  Diagnosis Date  . Cough 08/26/2014  . GAD (generalized anxiety disorder)   . Major depressive disorder, single episode, severe, without mention of psychotic behavior   . OCD (obsessive compulsive disorder)   . Urinary tract infection, site not specified    chronic since childhood. Has had multiple eval: cysto x 3 all with chronic inflammation with lymphocytis invasion with plasma cells. for suppresion   Patient Active Problem List   Diagnosis Date Noted  . Depression 12/13/2007    Priority: Medium  . Chronic UTI 08/22/2007    Priority: Medium  . 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 Constipation 08/13/2015    Priority: Low  . GERD (gastroesophageal reflux disease) 09/28/2017  . Hyperlipidemia 03/05/2017  . Anemia 08/13/2015   Past Surgical History:  Procedure Laterality Date  . CYSTOSCOPY     with uretheral stretches    Family History  Problem Relation Age of Onset  . Atrial fibrillation Mother   . Hyperlipidemia Mother   . Hypothyroidism Mother   . Coronary artery disease Father        early 44s, nonsmoker  . Hyperlipidemia Father   . Colon polyps Father   . Lymphoma Father        age 52- in spine but broke his back- treated and doing better  . Hypertension Brother   . Heart disease Other        dads side  . Heart disease Other        dads side  . Lung cancer Other   . Cancer Other        breast  . Cancer Paternal Grandfather   . Colon cancer Neg Hx   . Breast cancer Neg Hx     Medications- reviewed and updated Current Outpatient Medications  Medication Sig Dispense Refill  . buPROPion (WELLBUTRIN XL)  150 MG 24 hr tablet Take 150 mg by mouth daily. Through psych    . eszopiclone (LUNESTA) 1 MG TABS tablet Take 1 mg by mouth at bedtime as needed for sleep. Take immediately before bedtime    . linaclotide (LINZESS) 145 MCG CAPS capsule Take 1 capsule (145 mcg total) by mouth daily. 30 capsule 5  . omeprazole (PRILOSEC) 40 MG capsule Take 40 mg by mouth 2 (two) times daily.    . phenazopyridine (PYRIDIUM) 200 MG tablet Take 1 tablet (200 mg total) by mouth 3 (three) times daily as needed for pain. (Patient taking differently: Take 200 mg by mouth 3 (three) times daily as needed for pain. For bladder infection) 15 tablet 0  . SUMAtriptan (IMITREX) 25 MG tablet Take 1 tablet (25 mg total) by mouth every 2 (two) hours as needed for migraine (max 2 per day). May repeat in 2 hours if headache persists 10 tablet 1  . Armodafinil (NUVIGIL) 150 MG tablet Take 150 mg by mouth daily as needed (shift work disorder).      No current facility-administered medications for this visit.     Allergies-reviewed and updated Allergies  Allergen Reactions  . Ambien [Zolpidem Tartrate]     Sleep walking/activity    Social History   Social History Narrative   Live  with her 2 kids   1 son - '05; 1 daughter  - '03.      Divorced 2016 (ex took it well and still- good relationship with kids). Married '94 - 4 years, Divorced;remarried  '02    4 people in household      [name], [place] MSN, [name]       Hobbies: tennis, time with family   Objective  Objective:  BP 136/88   Pulse (!) 101   Temp 99 F (37.2 C)   Ht 5\' 4"  (1.626 m)   Wt 109 lb 6.4 oz (49.6 kg)   SpO2 98%   BMI 18.78 kg/m  Gen: NAD, resting comfortably HEENT: Mucous membranes are moist. Oropharynx normal Neck: no thyromegaly CV: RRR no murmurs rubs or gallops. Had caffeine before visit in regards to HR.  Lungs: CTAB no crackles, wheeze, rhonchi Abdomen: soft/nontender/nondistended/normal bowel sounds. No rebound or  guarding.  Ext: no edema Skin: warm, dry Neuro: grossly normal, moves all extremities, PERRLA   Assessment and Plan   53 y.o. female presenting for annual physical.  Health Maintenance counseling: 1. Anticipatory guidance: Patient counseled regarding regular dental exams -q6 months, eye exams - primarily readers, advised consider q5 years even if things going well,  avoiding smoking and second hand smoke , limiting alcohol to 1 beverage per day .   2. Risk factor reduction:  Advised patient of need for regular exercise and diet rich and fruits and vegetables to reduce risk of heart attack and stroke. Exercise-  doing some hiking, very active at home. Diet-patient states she stopped drinking sweet tea and over last year has noted weight loss, has cut down on sugar intake- she did not like to see weight up to 129. .  Wt Readings from Last 3 Encounters:  11/22/18 109 lb 6.4 oz (49.6 kg)  03/02/18 119 lb 4 oz (54.1 kg)  09/28/17 130 lb (59 kg)  3. Immunizations/screenings/ancillary studies Immunization History  Administered Date(s) Administered  . Influenza,inj,Quad PF,6+ Mos 11/30/2013, 11/22/2018  . Influenza-Unspecified 11/20/2016  . Tdap 03/05/2017  4. Cervical cancer screening- follows with Dr. Gertie Fey- 04/2016 with 3-5year repeat as hpv negative- transformation zone not present so they may have repeated this year- we will request 5. Breast cancer screening-  breast exam with Dr. Gertie Fey and mammogram . 10/21/2018 diagnostic left mammogram with [place]- found out only a cyst thankfully 6. Colon cancer screening - 2016 colonoscopy with Dr. Paulita Fujita with 10 year repeat 7. Skin cancer screening- no recent derm visits has one available if needed. advised regular sunscreen use. Denies worrisome, changing, or new skin lesions.  8. Birth control/STD check- postmenopausal and IUD and monogomous 9. Osteoporosis screening at 66- had this with GYN- we will get records -Never smoker  Status of chronic or acute  concerns   Was able to [place] house in [facility] [place] [facility]- she is thrilledby this- enjoys new home.   Depression- pt states her depression is doing fine.  On Pristiq 50 mg in the past and now on wellbutrin. and Lunesta 1 mg nightly (only takes 3-4 and does seem to help).  Follows with Dr. Dwaine Gale- she left and has a new provider.  Previously we had tried Ambien back in February- did not do well with this- sleep walking/activity. List as allergy  Migraines- left frontal usually imitrex works when uses. Wakes up in AM with this. Has had prior CT scan on area. Had ct maxillofcial last October.   Frequent UTI-works with  Dr. 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 Uses about once every 2 weeks  Hyperlipidemia-mild elevations last check-update with labs. Probably improved with dietary changes.  Lab Results  Component Value Date   CHOL 166 04/05/2017   HDL 54.60 04/05/2017   LDLCALC 100 (H) 04/05/2017   TRIG 59.0 04/05/2017   CHOLHDL 3 04/05/2017    GERD- Prilosec on hand as well as Carafate.  Currently taking very rarely only if has a lot of caffeine   Anemia- ferritin as low as 9 in the past due to gynecological issues.  Had endoscopy and colonoscopy in the past as well to make sure no GI issues/cause. Declines ferritin for now.   Diastolic slightly high today initially and higher than normal on repeat- was normal at gynecology recently- will trend only fo rnow.   Has had negative COVID-19 test recently related to congestion and sore throat- started last Wednesday.  Also has some leg swelling and headaches at times. Daughter had a cold- she lost 3 days of work.   Weight loss- eats less if stressed- have encouraged her to try to maintain weight and follow up with [place] if symptoms continue. She feels all self induced. Will check TSH to be on safe side.   Recommended follow  up: 1 year physical or sooner if needed  Lab/Order associations: not fasting - had biscuit with butter 8 am, 1.5 hours ago had 3 bites of coconut cake   ICD-10-CM   1. Preventative health care  Z00.00 CBC    Comprehensive metabolic panel    Lipid panel    TSH  2. Hyperlipidemia, unspecified hyperlipidemia type  E78.5 CBC    Comprehensive metabolic panel    Lipid panel    TSH  3. Need for immunization against influenza  Z23 Flu Vaccine QUAD 36+ mos IM    Meds ordered this encounter  Medications  . SUMAtriptan (IMITREX) 25 MG tablet    Sig: Take 1 tablet (25 mg total) by mouth every 2 (two) hours as needed for migraine (max 2 per day). May repeat in 2 hours if headache persists    Dispense:  10 tablet    Refill:  1    Return precautions advised.  Garret Reddish, MD

## 2018-11-22 ENCOUNTER — Encounter: Payer: Self-pay | Admitting: Family Medicine

## 2018-11-22 ENCOUNTER — Other Ambulatory Visit: Payer: Self-pay

## 2018-11-22 ENCOUNTER — Ambulatory Visit (INDEPENDENT_AMBULATORY_CARE_PROVIDER_SITE_OTHER): Payer: 59 | Admitting: Family Medicine

## 2018-11-22 VITALS — BP 136/88 | HR 101 | Temp 99.0°F | Ht 64.0 in | Wt 109.4 lb

## 2018-11-22 DIAGNOSIS — E785 Hyperlipidemia, unspecified: Secondary | ICD-10-CM | POA: Diagnosis not present

## 2018-11-22 DIAGNOSIS — Z0000 Encounter for general adult medical examination without abnormal findings: Secondary | ICD-10-CM

## 2018-11-22 DIAGNOSIS — Z23 Encounter for immunization: Secondary | ICD-10-CM

## 2018-11-22 MED ORDER — SUMATRIPTAN SUCCINATE 25 MG PO TABS: 25.0000 mg | ORAL_TABLET | ORAL | 1 refills | Status: DC | PRN

## 2018-11-22 NOTE — Addendum Note (Signed)
Addended by: Francis Dowse T on: 11/22/2018 03:53 PM   Modules accepted: Orders

## 2018-11-23 LAB — LIPID PANEL
Cholesterol: 179 mg/dL (ref 0–200)
HDL: 58 mg/dL (ref >39.00)
LDL Cholesterol: 106 mg/dL — ABNORMAL HIGH (ref 0–99)
NonHDL: 121.09
Total CHOL/HDL Ratio: 3
Triglycerides: 74 mg/dL (ref 0.0–149.0)
VLDL: 14.8 mg/dL (ref 0.0–40.0)

## 2018-11-23 LAB — COMPREHENSIVE METABOLIC PANEL
ALT: 8 U/L (ref 0–35)
AST: 12 U/L (ref 0–37)
Albumin: 4.4 g/dL (ref 3.5–5.2)
Alkaline Phosphatase: 61 U/L (ref 39–117)
BUN: 9 mg/dL (ref 6–23)
CO2: 27 mEq/L (ref 19–32)
Calcium: 9.7 mg/dL (ref 8.4–10.5)
Chloride: 105 mEq/L (ref 96–112)
Creatinine, Ser: 0.65 mg/dL (ref 0.40–1.20)
GFR: 95.87 mL/min (ref >60.00)
Glucose, Bld: 93 mg/dL (ref 70–99)
Potassium: 4.3 mEq/L (ref 3.5–5.1)
Sodium: 143 mEq/L (ref 135–145)
Total Bilirubin: 0.5 mg/dL (ref 0.2–1.2)
Total Protein: 6.8 g/dL (ref 6.0–8.3)

## 2018-11-23 LAB — CBC
HCT: 39.9 % (ref 36.0–46.0)
Hemoglobin: 13.4 g/dL (ref 12.0–15.0)
MCHC: 33.5 g/dL (ref 30.0–36.0)
MCV: 93.5 fl (ref 78.0–100.0)
Platelets: 301 10*3/uL (ref 150.0–400.0)
RBC: 4.27 Mil/uL (ref 3.87–5.11)
RDW: 12.8 % (ref 11.5–15.5)
WBC: 5.1 10*3/uL (ref 4.0–10.5)

## 2018-11-23 LAB — TSH: TSH: 0.88 u[IU]/mL (ref 0.35–4.50)

## 2018-11-25 MED FILL — ESTRADIOL-NORETHINDRONE ACE: 1-0.5 | 28 days supply | Qty: 28 | Fill #1

## 2018-11-29 DIAGNOSIS — F3181 Bipolar II disorder: Secondary | ICD-10-CM | POA: Diagnosis not present

## 2018-11-29 MED FILL — QUETIAPINE FUMARATE 25 MG T: 25 | 30 days supply | Qty: 60 | Fill #0

## 2018-11-29 MED FILL — buPROPion HCL ER (XL) 150 M: 150 | 30 days supply | Qty: 30 | Fill #0

## 2018-12-14 ENCOUNTER — Telehealth: Payer: Self-pay

## 2018-12-14 NOTE — Telephone Encounter (Signed)
Called l/m to call office  

## 2018-12-14 NOTE — Telephone Encounter (Signed)
Pt called back , she works for [employer] and just needed her proff of the flu shot faxed over to her office  [phone]

## 2018-12-14 NOTE — Telephone Encounter (Signed)
See note

## 2018-12-14 NOTE — Telephone Encounter (Signed)
Copied from [place] [id number]. Topic: General - Inquiry >> Dec 14, 2018  2:35 PM Mathis Bud wrote: Reason for CRM: Patient is requesting her flu shot vaccine be faxed to patient office.  Fax # [phone]

## 2018-12-16 ENCOUNTER — Encounter: Payer: Self-pay | Admitting: Family Medicine

## 2018-12-16 NOTE — Telephone Encounter (Signed)
Letter faxed to number provided and sent in my chart.

## 2018-12-28 ENCOUNTER — Telehealth: Payer: Self-pay | Admitting: Family Medicine

## 2018-12-28 DIAGNOSIS — F3181 Bipolar II disorder: Secondary | ICD-10-CM | POA: Diagnosis not present

## 2018-12-28 MED FILL — ARIPIPRAZOLE 5 MG TABS: 5 | 30 days supply | Qty: 30 | Fill #0

## 2018-12-28 MED FILL — BUPROPION HCL XL 150 MG TAB: 150 | 30 days supply | Qty: 30 | Fill #0

## 2018-12-28 NOTE — Telephone Encounter (Signed)
See note  Copied from [place] [id number]. Topic: General - Other >> Dec 28, 2018  2:32 PM Leward Quan A wrote: Reason for CRM: A representative with [employer] called to say that they received records faxed over but that they were not clear information were cut off and pages were overlapping. Asking if these records could be resubmitted again please. No other information given Ph# [phone]

## 2018-12-29 NOTE — Telephone Encounter (Signed)
Amanda Alvarez called stating they are still waiting on this information to be faxed back.   Updated phone # [phone] ext [phone]

## 2018-12-29 NOTE — Telephone Encounter (Signed)
I tried calling the number provided and it is not a correct number.

## 2018-12-29 NOTE — Telephone Encounter (Signed)
Called medical records and spoke with Lyn and she states she does not see where anything was sent to this company, [place] will call another records company to see if they know anything. I provided her with the number from [employer] so she can follow up with them.

## 2018-12-30 ENCOUNTER — Telehealth: Payer: Self-pay | Admitting: Family Medicine

## 2018-12-30 NOTE — Telephone Encounter (Signed)
Thekiyrah calling from [place] is calling to see if fax was received.Please advise CB- [phone]

## 2018-12-30 NOTE — Telephone Encounter (Signed)
See below

## 2019-01-06 MED FILL — NUVIGIL 150 MG TABLET: 150 | 30 days supply | Qty: 30 | Fill #1

## 2019-01-06 MED FILL — ESTRADIOL-NORETHINDRONE ACE: 1-0.5 | 28 days supply | Qty: 28 | Fill #2

## 2019-01-18 DIAGNOSIS — Z0100 Encounter for examination of eyes and vision without abnormal findings: Secondary | ICD-10-CM | POA: Diagnosis not present

## 2019-01-25 DIAGNOSIS — F3181 Bipolar II disorder: Secondary | ICD-10-CM | POA: Diagnosis not present

## 2019-01-25 MED FILL — ARIPIPRAZOLE 5 MG TABS: 5 | 30 days supply | Qty: 30 | Fill #0

## 2019-01-25 MED FILL — buPROPion HCL ER (XL) 150 M: 150 | 30 days supply | Qty: 30 | Fill #0

## 2019-02-03 MED FILL — ARMODAFINIL 150 MG TABLET: 150 | 30 days supply | Qty: 30 | Fill #0

## 2019-02-09 MED FILL — ARIPIPRAZOLE 10 MG TABS: 10 | 30 days supply | Qty: 30 | Fill #0

## 2019-03-08 DIAGNOSIS — F3181 Bipolar II disorder: Secondary | ICD-10-CM | POA: Diagnosis not present

## 2019-03-08 MED FILL — ESTRADIOL-NORETHINDRONE ACE: 1-0.5 | 28 days supply | Qty: 28 | Fill #3

## 2019-03-08 MED FILL — buPROPion HCL ER (XL) 150 M: 150 | 30 days supply | Qty: 30 | Fill #0

## 2019-03-08 MED FILL — LATUDA 20 MG TABLET: 20 | 30 days supply | Qty: 30 | Fill #0

## 2019-04-06 DIAGNOSIS — F3181 Bipolar II disorder: Secondary | ICD-10-CM | POA: Diagnosis not present

## 2019-04-06 MED FILL — buPROPion HCL ER (XL) 150 M: 150 | 30 days supply | Qty: 30 | Fill #0

## 2019-05-09 DIAGNOSIS — F3181 Bipolar II disorder: Secondary | ICD-10-CM | POA: Diagnosis not present

## 2019-05-31 MED FILL — ESTRADIOL-NORETHINDRONE ACE: 1-0.5 | 28 days supply | Qty: 28 | Fill #4

## 2019-06-02 ENCOUNTER — Encounter: Payer: Self-pay | Admitting: Family Medicine

## 2019-06-02 ENCOUNTER — Telehealth (INDEPENDENT_AMBULATORY_CARE_PROVIDER_SITE_OTHER): Payer: 59 | Admitting: Family Medicine

## 2019-06-02 VITALS — Ht 64.5 in | Wt 119.0 lb

## 2019-06-02 DIAGNOSIS — R208 Other disturbances of skin sensation: Secondary | ICD-10-CM | POA: Diagnosis not present

## 2019-06-02 DIAGNOSIS — F330 Major depressive disorder, recurrent, mild: Secondary | ICD-10-CM | POA: Diagnosis not present

## 2019-06-02 DIAGNOSIS — N390 Urinary tract infection, site not specified: Secondary | ICD-10-CM | POA: Diagnosis not present

## 2019-06-02 MED ORDER — BUPROPION HCL ER (XL) 150 MG PO TB24: 150.0000 mg | ORAL_TABLET | Freq: Every day | ORAL | 11 refills | Status: DC

## 2019-06-02 MED ORDER — CEPHALEXIN 500 MG PO CAPS: 500.0000 mg | ORAL_CAPSULE | Freq: Three times a day (TID) | ORAL | 0 refills | Status: AC

## 2019-06-02 MED FILL — CEPHALEXIN 500 MG CAPSULE: 500 | 7 days supply | Qty: 21 | Fill #0

## 2019-06-02 MED FILL — buPROPion HCL ER (XL) 150 M: 150 | 30 days supply | Qty: 30 | Fill #0

## 2019-06-02 NOTE — Patient Instructions (Addendum)
Health Maintenance Due  Topic Date Due  . PAP SMEAR-Modifier will send for notes  04/22/2019   Depression screen PHQ 2/9 11/22/2018  Decreased Interest 0  Down, Depressed, Hopeless 0  PHQ - 2 Score 0  Altered sleeping 3  Tired, decreased energy 0  Change in appetite 0  Feeling bad or failure about yourself  0  Trouble concentrating 0  Moving slowly or fidgety/restless 0  Suicidal thoughts 0  PHQ-9 Score 3  Difficult doing work/chores Not difficult at all

## 2019-06-02 NOTE — Progress Notes (Signed)
Phone [phone] Virtual visit via Video note   Subjective:  Chief complaint: Chief Complaint  Patient presents with  . virtual  . sinus issues  . Urinary Tract Infection   This visit type was conducted due to national recommendations for restrictions regarding the COVID-19 Pandemic (e.g. social distancing).  This format is felt to be most appropriate for this patient at this time balancing risks to patient and risks to population by having him in for in person visit.  No physical exam was performed (except for noted visual exam or audio findings with Telehealth visits).    Our team/I connected with Amanda Alvarez at  8:00 AM EDT by a video enabled telemedicine application (doxy.me or caregility through epic)  -Interactive audio and video telecommunications were attempted between this provider and patient, however failed, due to patient having technical difficulties OR patient did not have access to video capability.  We continued and completed visit with audio only. I verified that I am speaking with the correct person using two identifiers.  Location patient: [place] Location provider: [facility], office Persons participating in the virtual visit:  patient  Time on phone call: 16 minutes   Our team/I discussed the limitations of evaluation and management by telemedicine and the availability of in person appointments. In light of current covid-19 pandemic, patient also understands that we are trying to protect them by minimizing in office contact if at all possible.  The patient expressed consent for telemedicine visit and agreed to proceed. Patient understands insurance will be billed.   Past Medical History-  Patient Active Problem List   Diagnosis Date Noted  . Depression 12/13/2007    Priority: Medium  . Chronic UTI 08/22/2007    Priority: Medium  . 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 Constipation 08/13/2015    Priority: Low  . GERD  (gastroesophageal reflux disease) 09/28/2017  . Hyperlipidemia 03/05/2017  . Anemia 08/13/2015    Medications- reviewed and updated Current Outpatient Medications  Medication Sig Dispense Refill  . linaclotide (LINZESS) 145 MCG CAPS capsule Take 1 capsule (145 mcg total) by mouth daily. 30 capsule 5  . omeprazole (PRILOSEC) 40 MG capsule Take 40 mg by mouth 2 (two) times daily.    . phenazopyridine (PYRIDIUM) 200 MG tablet Take 1 tablet (200 mg total) by mouth 3 (three) times daily as needed for pain. (Patient taking differently: Take 200 mg by mouth 3 (three) times daily as needed for pain. For bladder infection) 15 tablet 0  . SUMAtriptan (IMITREX) 25 MG tablet Take 1 tablet (25 mg total) by mouth every 2 (two) hours as needed for migraine (max 2 per day). May repeat in 2 hours if headache persists 10 tablet 1  . buPROPion (WELLBUTRIN XL) 150 MG 24 hr tablet Take 1 tablet (150 mg total) by mouth daily. 30 tablet 11  . cephALEXin (KEFLEX) 500 MG capsule Take 1 capsule (500 mg total) by mouth 3 (three) times daily for 7 days. 21 capsule 0   No current facility-administered medications for this visit.     Objective:  Ht 5' 4.5" (1.638 m)   Wt 119 lb (54 kg)   BMI 20.11 kg/m  self reported vitals Gen: NAD, resting comfortably Lungs: nonlabored, normal respiratory rate  Skin: appears dry, no obvious rash     Assessment and Plan   Sinus Issues S:Symptoms in last "few Days". She describes as burning in nose and face. Denies any fever, cough or sore  throat.    Air conditioning ultimately the cause.  She had prior extensive work-up for similar issues last year including sinus CT that was unrevealing A/P: Patient asks about using Flonase versus ipratropium and which might benefit her the most-she has had some relief with Flonase and she was wondering if she could take that long-term-I told her I thought Flonase was a very reasonable option and she certainly can take it for a month if it is  helpful for her.  I told her some patients take this on an ongoing basis through the year for allergies so taking it through the heavy air conditioning months would be fine  #Concern for UTI S: Patients symptoms started four days ago.Symptoms are worsening.     Complains of dysuria- yes and severe "razor blades" sensation:; polyuria: none; nocturia: none; urgency: none. She has been taking Pyridium.  A/P:  Likely UTI. Empiric treatment with: Keflex.  Patient is young and healthy without history of complicated UTI-we jointly agreed to try antibiotics and to follow-up with urine culture if not improving by next week-should also seek care if has worsening symptoms despite antibiotics  Patient to follow up if new or worsening symptoms or failure to improve.   # Depression S: Patient has been followed by psychiatry in the past for depression-most recently had been on Wellbutrin and nuvigil.  Unfortunately there were a lot of prior authorization issues due to patient not having sleep apnea in regards to the nuvigil.  They tried multiple alternatives including seroquel and latuda trials- felt groggy and didn't like it.  Had been on Pristiq in the past as well but hated how she felt if she missed a day  Patient asks about me prescribing provigil-she uses this in the daytime and not for shift work disorder as I previously had thought Depression screen [facility] 2/9 06/02/2019 11/22/2018  Decreased Interest 1 0  Down, Depressed, Hopeless 1 0  PHQ - 2 Score 2 0  Altered sleeping 3 3  Tired, decreased energy 0 0  Change in appetite 0 0  Feeling bad or failure about yourself  0 0  Trouble concentrating 3 0  Moving slowly or fidgety/restless 0 0  Suicidal thoughts 0 0  PHQ-9 Score 8 3  Difficult doing work/chores Somewhat difficult Not difficult at all   A/P: Mild poor control depression.  It seems like Wellbutrin also really helps her with focus. Provigil also helps her with focus-I was honest with her that I  did not have enough experience with this medication for daytime use to suggest/prescribed it-encouraged psychiatry follow-up.  Would be reasonable to check in 1 to 2 months to see how she is doing with Wellbutrin alternatively she may follow-up with psychiatry.  She should contact us immediately if has any thoughts of self-harm but this has not happened in the past  Recommended follow up: as needed for acute concerns  Lab/Order associations:   ICD-10-CM   1. UTI (urinary tract infection), uncomplicated  [id number]   2. Mild episode of recurrent major depressive disorder (HCC)  F33.0   3. Nasal burning  R20.8     Meds ordered this encounter  Medications  . cephALEXin (KEFLEX) 500 MG capsule    Sig: Take 1 capsule (500 mg total) by mouth 3 (three) times daily for 7 days.    Dispense:  21 capsule    Refill:  0  . buPROPion (WELLBUTRIN XL) 150 MG 24 hr tablet    Sig: Take 1 tablet (150  mg total) by mouth daily.    Dispense:  30 tablet    Refill:  11   Return precautions advised.  Garret Reddish, MD

## 2019-06-02 NOTE — Assessment & Plan Note (Signed)
S: Patient has been followed by psychiatry in the past for depression-most recently had been on Wellbutrin and nuvigil.  Unfortunately there were a lot of prior authorization issues due to patient not having sleep apnea in regards to the nuvigil.  They tried multiple alternatives including seroquel and latuda trials- felt groggy and didn't like it.  Had been on Pristiq in the past as well but hated how she felt if she missed a day  Patient asks about me prescribing provigil-she uses this in the daytime and not for shift work disorder as I previously had thought Depression screen [facility] 2/9 06/02/2019 11/22/2018  Decreased Interest 1 0  Down, Depressed, Hopeless 1 0  PHQ - 2 Score 2 0  Altered sleeping 3 3  Tired, decreased energy 0 0  Change in appetite 0 0  Feeling bad or failure about yourself  0 0  Trouble concentrating 3 0  Moving slowly or fidgety/restless 0 0  Suicidal thoughts 0 0  PHQ-9 Score 8 3  Difficult doing work/chores Somewhat difficult Not difficult at all   A/P: Mild poor control depression.  It seems like Wellbutrin also really helps her with focus. Provigil also helps her with focus-I was honest with her that I did not have enough experience with this medication for daytime use to suggest/prescribed it-encouraged psychiatry follow-up.  Would be reasonable to check in 1 to 2 months to see how she is doing with Wellbutrin alternatively she may follow-up with psychiatry.  She should contact us immediately if has any thoughts of self-harm but this has not happened in the past

## 2019-06-08 DIAGNOSIS — F3181 Bipolar II disorder: Secondary | ICD-10-CM | POA: Diagnosis not present

## 2019-06-08 DIAGNOSIS — F900 Attention-deficit hyperactivity disorder, predominantly inattentive type: Secondary | ICD-10-CM | POA: Diagnosis not present

## 2019-06-08 MED FILL — ADDERALL XR 20 MG CAP SA: 20 | 30 days supply | Qty: 30 | Fill #0

## 2019-07-05 DIAGNOSIS — F3181 Bipolar II disorder: Secondary | ICD-10-CM | POA: Diagnosis not present

## 2019-07-05 MED FILL — LATUDA 40 MG TABLET: 40 | 30 days supply | Qty: 30 | Fill #0

## 2019-07-07 MED FILL — ADDERALL XR 20 MG CAP SA: 20 | 30 days supply | Qty: 30 | Fill #0

## 2019-07-13 MED FILL — ESTRADIOL-NORETHINDRONE ACE: 1-0.5 | 28 days supply | Qty: 28 | Fill #5

## 2019-08-15 DIAGNOSIS — F3181 Bipolar II disorder: Secondary | ICD-10-CM | POA: Diagnosis not present

## 2019-08-15 MED FILL — ADDERALL XR 20 MG CAP SA: 20 | 30 days supply | Qty: 30 | Fill #0

## 2019-08-15 MED FILL — LATUDA 20 MG TABLET: 20 | 30 days supply | Qty: 30 | Fill #0

## 2019-08-17 MED FILL — ESTRADIOL-NORETHINDRONE ACE: 1-0.5 | 28 days supply | Qty: 28 | Fill #6

## 2019-09-01 ENCOUNTER — Other Ambulatory Visit: Payer: Self-pay | Admitting: Family Medicine

## 2019-09-01 MED FILL — LINZESS 145 MCG CAPSULE: 145 | 30 days supply | Qty: 30 | Fill #0

## 2019-09-11 MED FILL — LINZESS 145 MCG CAPSULE: 145 | 30 days supply | Qty: 30 | Fill #0

## 2019-09-18 ENCOUNTER — Encounter: Payer: Self-pay | Admitting: Family Medicine

## 2019-09-18 ENCOUNTER — Ambulatory Visit (INDEPENDENT_AMBULATORY_CARE_PROVIDER_SITE_OTHER): Payer: 59 | Admitting: Family Medicine

## 2019-09-18 ENCOUNTER — Other Ambulatory Visit: Payer: Self-pay

## 2019-09-18 VITALS — BP 124/68 | HR 63 | Temp 98.2°F | Ht 64.5 in | Wt 116.4 lb

## 2019-09-18 DIAGNOSIS — M25562 Pain in left knee: Secondary | ICD-10-CM

## 2019-09-18 DIAGNOSIS — G8929 Other chronic pain: Secondary | ICD-10-CM

## 2019-09-18 NOTE — Patient Instructions (Signed)
Pes bursitis vs. Medial meniscus injury vs. Regular OA.   Xray ordered. See below.  I have ordered xrays for you. At this time we do not have xrays in our clinic. You will have to go to our [facility]. The address is [address]  xray is located in the basement.  Hours of operation are M-F 8:30am to 5:00pm.  Closed for lunch between 12:30 and 1:00pm.    Would do voltaren gel as well if pain gets worse. If not getting better let us know so we can send to sports med. Vs. Ortho.  Nice to meet you!  Dr. Rogers Blocker

## 2019-09-18 NOTE — Progress Notes (Signed)
Patient: Amanda Alvarez MRN: [id number] DOB: [birth year] PCP: Marin Olp, MD     Subjective:  Chief Complaint  Patient presents with  . Knee Pain    Left knee pain, started about 6 months ago. Denies injury.    HPI: The patient is a 54 y.o. female who presents today for left knee pain x  6 months. She states pain first started on medial side of her left knee. She would notice if she was sleeping and had her leg bent as well as squatting/bending motion. She states hiking doesn't bother her. If she crosses her leg over her other leg she can feel it. Pain rated as a 2/10 when she bends it and described as dull. Goes away if she is not bending it. She has not taken anything for the pain. She has no history of arthritis. Parents do have arthritis. No trauma to the knee and not in sports. She did play tennis and never had any issues with her knee. No swelling and no decreased ROM. No gait abnormality.   Review of Systems  Constitutional: Negative for chills, fatigue and fever.  HENT: Negative for dental problem, ear pain, hearing loss and trouble swallowing.   Eyes: Negative for visual disturbance.  Respiratory: Negative for cough, chest tightness and shortness of breath.   Cardiovascular: Negative for chest pain, palpitations and leg swelling.  Gastrointestinal: Negative for abdominal pain, blood in stool, diarrhea and nausea.  Endocrine: Negative for cold intolerance, polydipsia, polyphagia and polyuria.  Genitourinary: Negative for dysuria and hematuria.  Musculoskeletal: Positive for arthralgias. Negative for gait problem and joint swelling.  Skin: Negative for rash.  Neurological: Negative for dizziness and headaches.  Psychiatric/Behavioral: Negative for dysphoric mood and sleep disturbance. The patient is not nervous/anxious.     Allergies Patient is allergic to [employer] tartrate].  Past Medical History Patient  has a past medical history of Cough (08/26/2014), GAD  (generalized anxiety disorder), Major depressive disorder, single episode, severe, without mention of psychotic behavior, OCD (obsessive compulsive disorder), and Urinary tract infection, site not specified.  Surgical History Patient  has a past surgical history that includes Cystoscopy.  Family History Pateint's family history includes Atrial fibrillation in her mother; Cancer in her paternal grandfather and another family member; Colon polyps in her father; Coronary artery disease in her father; Heart disease in some other family members; Hyperlipidemia in her father and mother; Hypertension in her brother; Hypothyroidism in her mother; Lung cancer in an other family member; Lymphoma in her father.  Social History Patient  reports that she has never smoked. She has never used smokeless tobacco. She reports current alcohol use. She reports that she does not use drugs.    Objective: Vitals:   09/18/19 1342  BP: 124/68  Pulse: 63  Temp: 98.2 F (36.8 C)  TempSrc: Temporal  SpO2: 99%  Weight: 116 lb 6.4 oz (52.8 kg)  Height: 5' 4.5" (1.638 m)    Body mass index is 19.67 kg/m.  Physical Exam Vitals reviewed.  Constitutional:      Appearance: Normal appearance. She is normal weight.  HENT:     Head: Normocephalic and atraumatic.  Pulmonary:     Effort: Pulmonary effort is normal.  Musculoskeletal:        General: No swelling.     Comments: Left knee: no erythema or edema. TTP on medial aspect of knee, midline of medial knee. Only pain with bending. Full extension and flexion. Normal gait. Negative draw signs and  no pain with valgus or varus pressure. Negative mcmurray sign.   Neurological:     General: No focal deficit present.     Mental Status: She is alert and oriented to person, place, and time.        Assessment/plan: 1. Chronic pain of left knee anes bursitis vs. OA vs. Meniscal injury. Handout given for rehab exercises for pes anes bursitis. Also getting Xray to  check for OA. Pain is only 2/10, but recommended voltaren prn. If not getting better would refer to sports med. Vs. Ortho.     This visit occurred during the SARS-CoV-2 public health emergency.  Safety protocols were in place, including screening questions prior to the visit, additional usage of staff PPE, and extensive cleaning of exam room while observing appropriate contact time as indicated for disinfecting solutions.     Return if symptoms worsen or fail to improve.     Orma Flaming, MD [place]  09/18/2019

## 2019-09-19 MED FILL — ESTRADIOL-NORETHINDRONE ACE: 1-0.5 | 28 days supply | Qty: 28 | Fill #7

## 2019-09-19 MED FILL — ADDERALL XR 20 MG CAP SA: 20 | 30 days supply | Qty: 30 | Fill #0

## 2019-09-27 DIAGNOSIS — F3181 Bipolar II disorder: Secondary | ICD-10-CM | POA: Diagnosis not present

## 2019-09-27 MED FILL — risperiDONE 0.5 MG TABS: 0.5 | 30 days supply | Qty: 30 | Fill #0

## 2019-10-25 DIAGNOSIS — F3181 Bipolar II disorder: Secondary | ICD-10-CM | POA: Diagnosis not present

## 2019-10-25 MED FILL — ADDERALL XR 20 MG CAP SA: 20 | 30 days supply | Qty: 30 | Fill #0

## 2019-10-25 MED FILL — RISPERIDONE 3 MG TABS: 3 | 30 days supply | Qty: 15 | Fill #0

## 2019-11-01 DIAGNOSIS — F3181 Bipolar II disorder: Secondary | ICD-10-CM | POA: Diagnosis not present

## 2019-11-02 NOTE — Patient Instructions (Addendum)
Health Maintenance Due  Topic Date Due  . Hepatitis C Screening  Never done  . COVID-19 Vaccine (1) Doesn't remember the dates. She received them thru [facility].  Never done  . PAP SMEAR-Modifier  04/22/2019  . MAMMOGRAM  06/03/2019  . INFLUENZA VACCINE  09/17/2019

## 2019-11-02 NOTE — Progress Notes (Signed)
Phone [phone] Virtual visit via phonenote   Subjective:   Chief Complaint  Patient presents with  . Depression   This visit type was conducted due to national recommendations for restrictions regarding the COVID-19 Pandemic (e.g. social distancing).  This format is felt to be most appropriate for this patient at this time balancing risks to patient and risks to population by having him in for in person visit.  All issues noted in this document were discussed and addressed.  No physical exam was performed (except for noted visual exam or audio findings with Telehealth visits).  The patient has consented to conduct a Telehealth visit and understands insurance will be billed.   Our team/I connected with Amanda Alvarez at  9:20 AM EDT by phone (patient did not have equipment for webex) and verified that I am speaking with the correct person using two identifiers.  Location patient: [place] Location provider: [facility], office Persons participating in the virtual visit:  patient  Time on phone: 12 minutes  Our team/I discussed the limitations of evaluation and management by telemedicine and the availability of in person appointments. In light of current covid-19 pandemic, patient also understands that we are trying to protect them by minimizing in office contact if at all possible.  The patient expressed consent for telemedicine visit and agreed to proceed. Patient understands insurance will be billed.   Past Medical History-  Patient Active Problem List   Diagnosis Date Noted  . Depression 12/13/2007    Priority: Medium  . Chronic UTI 08/22/2007    Priority: Medium  . 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 Constipation 08/13/2015    Priority: Low  . GERD (gastroesophageal reflux disease) 09/28/2017  . Hyperlipidemia 03/05/2017  . Anemia 08/13/2015    Medications- reviewed and updated Current Outpatient Medications  Medication Sig Dispense Refill  .  cholecalciferol (VITAMIN D3) 25 MCG (1000 UNIT) tablet Take 1,000 Units by mouth daily.    Marland Kitchen estradiol-norethindrone (ACTIVELLA) 1-0.5 MG tablet Take 1 tablet by mouth daily.    Marland Kitchen LINZESS 145 MCG CAPS capsule TAKE 1 CAPSULE (145 MCG TOTAL) BY MOUTH DAILY. (Patient taking differently: Take 145 mcg by mouth as needed. ) 30 capsule 5  . phenazopyridine (PYRIDIUM) 200 MG tablet Take 1 tablet (200 mg total) by mouth 3 (three) times daily as needed for pain. (Patient taking differently: Take 200 mg by mouth 3 (three) times daily as needed for pain. For bladder infection) 15 tablet 0  . SUMAtriptan (IMITREX) 25 MG tablet Take 1 tablet (25 mg total) by mouth every 2 (two) hours as needed for migraine (max 2 per day). May repeat in 2 hours if headache persists 10 tablet 1  . escitalopram (LEXAPRO) 10 MG tablet Take 1 tablet (10 mg total) by mouth daily. 90 tablet 3   No current facility-administered medications for this visit.     Objective:  no self reported vitals  Nonlabored voice, normal speech         Assessment and Plan   Depression S: Had been on antidepressant with psychiatry but her provider left and does not have a good fit - has been placed on multiple meds that she prefers not to be on like risperidone/zyprexa.   Did ok on pristiq but eventually didn't feel like it worked as well eventually on lower dose. Also may have been on celexa and lexapro in the past as well as wellbutrin.  Stressors including daughter leaving for college  1st year   Medication:none at this time  Depression screen [facility] 2/9 11/03/2019 06/02/2019 11/22/2018  Decreased Interest 3 1 0  Down, Depressed, Hopeless 3 1 0  PHQ - 2 Score 6 2 0  Altered sleeping 3 3 3   Tired, decreased energy 0 0 0  Change in appetite 0 0 0  Feeling bad or failure about yourself  0 0 0  Trouble concentrating 3 3 0  Moving slowly or fidgety/restless 0 0 0  Suicidal thoughts 0 0 0  PHQ-9 Score 12 8 3   Difficult doing work/chores  Somewhat difficult Somewhat difficult Not difficult at all   A/P: Depression recurrent-in the process of reestablishing with prior psychiatrist who moved (new psychiatrist strongly prefers antipsychotics which patient would like to remain off of).  Needs a short-term bridge on medication.  I have sent in Lexapro 10 mg #90 with 3 refills for her.  I asked her to update me in 6 weeks via MyChart with how she is doing and if she has been able to get a new appointment scheduled with her prior practice.  If she has any thoughts of self-harm she will contact us immediately or if she has any new or worsening symptoms    #Left knee pain-there was some concern for bursitis and she was given home exercises that she has seen substantial improvement with this.  She asks if she still needs to complete x-ray but given improvement we opted to not proceed with x-ray at this time-if she has new or worsening symptoms we can certainly readdress this  Recommended follow up:  6 week mychart update Future Appointments  Date Time Provider [place]  11/09/2019  1:20 PM Mozingo, Berdie Ogren, NP CP-CP None   Lab/Order associations:   ICD-10-CM   1. Moderate episode of recurrent major depressive disorder ([place])  F33.1     Meds ordered this encounter  Medications  . escitalopram (LEXAPRO) 10 MG tablet    Sig: Take 1 tablet (10 mg total) by mouth daily.    Dispense:  90 tablet    Refill:  3    Return precautions advised.  Garret Reddish, MD

## 2019-11-03 ENCOUNTER — Encounter: Payer: Self-pay | Admitting: Family Medicine

## 2019-11-03 ENCOUNTER — Other Ambulatory Visit: Payer: Self-pay

## 2019-11-03 ENCOUNTER — Telehealth (INDEPENDENT_AMBULATORY_CARE_PROVIDER_SITE_OTHER): Payer: 59 | Admitting: Family Medicine

## 2019-11-03 DIAGNOSIS — F331 Major depressive disorder, recurrent, moderate: Secondary | ICD-10-CM | POA: Diagnosis not present

## 2019-11-03 MED ORDER — ESCITALOPRAM OXALATE 10 MG PO TABS: 10.0000 mg | ORAL_TABLET | Freq: Every day | ORAL | 3 refills | Status: DC

## 2019-11-03 MED FILL — ESCITALOPRAM 10 MG TABLET: 10 | 90 days supply | Qty: 90 | Fill #0

## 2019-11-03 NOTE — Assessment & Plan Note (Signed)
S: Had been on antidepressant with psychiatry but her provider left and does not have a good fit - has been placed on multiple meds that she prefers not to be on like risperidone/zyprexa.   Did ok on pristiq but eventually didn't feel like it worked as well eventually on lower dose. Also may have been on celexa and lexapro in the past as well as wellbutrin.  Stressors including daughter leaving for college 1st year   Medication:none at this time  Depression screen [facility] 2/9 11/03/2019 06/02/2019 11/22/2018  Decreased Interest 3 1 0  Down, Depressed, Hopeless 3 1 0  PHQ - 2 Score 6 2 0  Altered sleeping 3 3 3   Tired, decreased energy 0 0 0  Change in appetite 0 0 0  Feeling bad or failure about yourself  0 0 0  Trouble concentrating 3 3 0  Moving slowly or fidgety/restless 0 0 0  Suicidal thoughts 0 0 0  PHQ-9 Score 12 8 3   Difficult doing work/chores Somewhat difficult Somewhat difficult Not difficult at all   A/P: Depression recurrent-in the process of reestablishing with prior psychiatrist who moved (new psychiatrist strongly prefers antipsychotics which patient would like to remain off of).  Needs a short-term bridge on medication.  I have sent in Lexapro 10 mg #90 with 3 refills for her.  I asked her to update me in 6 weeks via MyChart with how she is doing and if she has been able to get a new appointment scheduled with her prior practice.  If she has any thoughts of self-harm she will contact us immediately or if she has any new or worsening symptoms

## 2019-11-06 ENCOUNTER — Other Ambulatory Visit (HOSPITAL_COMMUNITY): Payer: Self-pay | Admitting: Obstetrics and Gynecology

## 2019-11-06 DIAGNOSIS — Z1231 Encounter for screening mammogram for malignant neoplasm of breast: Secondary | ICD-10-CM | POA: Diagnosis not present

## 2019-11-06 DIAGNOSIS — Z01419 Encounter for gynecological examination (general) (routine) without abnormal findings: Secondary | ICD-10-CM | POA: Diagnosis not present

## 2019-11-06 DIAGNOSIS — Z681 Body mass index (BMI) 19 or less, adult: Secondary | ICD-10-CM | POA: Diagnosis not present

## 2019-11-06 LAB — HM PAP SMEAR: HM Pap smear: NEGATIVE

## 2019-11-06 LAB — RESULTS CONSOLE HPV: CHL HPV: NEGATIVE

## 2019-11-06 MED FILL — ESTRADIOL-NORETHINDRONE ACE: 1-0.5 | 84 days supply | Qty: 84 | Fill #0

## 2019-11-09 ENCOUNTER — Ambulatory Visit (INDEPENDENT_AMBULATORY_CARE_PROVIDER_SITE_OTHER): Payer: 59 | Admitting: Adult Health

## 2019-11-09 ENCOUNTER — Encounter: Payer: Self-pay | Admitting: Adult Health

## 2019-11-09 ENCOUNTER — Other Ambulatory Visit: Payer: Self-pay

## 2019-11-09 VITALS — BP 130/69 | HR 80 | Wt 116.0 lb

## 2019-11-09 DIAGNOSIS — G4700 Insomnia, unspecified: Secondary | ICD-10-CM

## 2019-11-09 DIAGNOSIS — F331 Major depressive disorder, recurrent, moderate: Secondary | ICD-10-CM

## 2019-11-09 MED ORDER — AMPHETAMINE-DEXTROAMPHET ER 10 MG PO CP24: 10.0000 mg | ORAL_CAPSULE | Freq: Every day | ORAL | 0 refills | Status: DC

## 2019-11-09 MED ORDER — ESZOPICLONE 2 MG PO TABS: 2.0000 mg | ORAL_TABLET | Freq: Every evening | ORAL | 2 refills | Status: DC | PRN

## 2019-11-09 MED FILL — ESZOPICLONE 2 MG TAB: 2 | 30 days supply | Qty: 30 | Fill #0

## 2019-11-09 MED FILL — ADDERALL XR 10 MG CAP SA: 10 | 30 days supply | Qty: 30 | Fill #0

## 2019-11-09 NOTE — Progress Notes (Signed)
Amanda Alvarez [id number] 04-20-1965 54 y.o.  Subjective:   [name] is a 54 y.o. (DOB 11/24/1965) female.  Chief Complaint: No chief complaint on file.   HPI Amanda Alvarez presents to the office today for follow-up of MDD and insomnia.  Describes mood today as "ok". Pleasant. Denies tearfulness. Mood symptoms - denies depression, anxiety, and irritability. PCP restarted her on Lexapro 10mg  daily a week ago. Would like to decrease dose of Adderall 20mg  to Adderall 10mg  daily. Stable interest and motivation. Taking medications as prescribed.  Energy levels stable. Active, has a regular exercise routine. Walking and hiking. Enjoys some usual interests and activities. Single. Lives with son age 74 years old. Daughter a Museum/gallery exhibitions officer at [employer]. Spending time with family. Appetite adequate. Weight loss 10 pounds over past year - current weight 115 pounds. Sleeps well most nights. Averages 5 hours. Has taken Lunesta in the past and feels like it was helpful.  Focus and concentration difficulties. Completing tasks. Managing aspects of household. 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  Denies SI or HI.  Denies AH or VH.  Previous medication trials: Provigil   PHQ2-9     Video Visit from 11/03/2019 in [place] Video Visit from 06/02/2019 in [place] Visit from 11/22/2018 in [facility]  PHQ-2 Total Score 6 2 0  PHQ-9 Total Score 12 8 3        Review of Systems:  Review of Systems  Musculoskeletal: Negative for gait problem.  Neurological: Negative for tremors.  Psychiatric/Behavioral:       Please refer to HPI    Medications: I have reviewed the patient's current medications.  Current Outpatient Medications  Medication Sig Dispense Refill   amphetamine-dextroamphetamine (ADDERALL XR) 10 MG 24 hr capsule Take 1 capsule (10 mg total) by mouth daily. 30 capsule 0    cholecalciferol (VITAMIN D3) 25 MCG (1000 UNIT) tablet Take 1,000 Units by mouth daily.     escitalopram (LEXAPRO) 10 MG tablet Take 1 tablet (10 mg total) by mouth daily. 90 tablet 3   estradiol-norethindrone (ACTIVELLA) 1-0.5 MG tablet Take 1 tablet by mouth daily.     eszopiclone (LUNESTA) 2 MG TABS tablet Take 1 tablet (2 mg total) by mouth at bedtime as needed for sleep. Take immediately before bedtime 30 tablet 2   LINZESS 145 MCG CAPS capsule TAKE 1 CAPSULE (145 MCG TOTAL) BY MOUTH DAILY. (Patient taking differently: Take 145 mcg by mouth as needed. ) 30 capsule 5   phenazopyridine (PYRIDIUM) 200 MG tablet Take 1 tablet (200 mg total) by mouth 3 (three) times daily as needed for pain. (Patient taking differently: Take 200 mg by mouth 3 (three) times daily as needed for pain. For bladder infection) 15 tablet 0   SUMAtriptan (IMITREX) 25 MG tablet Take 1 tablet (25 mg total) by mouth every 2 (two) hours as needed for migraine (max 2 per day). May repeat in 2 hours if headache persists 10 tablet 1   No current facility-administered medications for this visit.    Medication Side Effects: None  Allergies:  Allergies  Allergen Reactions   Ambien [Zolpidem Tartrate]     Sleep walking/activity    Past Medical History:  Diagnosis Date   Cough 08/26/2014   GAD (generalized anxiety disorder)    Major depressive disorder, single episode, severe, without mention of psychotic behavior    OCD (obsessive compulsive disorder)    Urinary tract  infection, site not specified    chronic since childhood. Has had multiple eval: cysto x 3 all with chronic inflammation with lymphocytis invasion with plasma cells. for suppresion    Family History  Problem Relation Age of Onset   Atrial fibrillation Mother    Hyperlipidemia Mother    Hypothyroidism Mother    Coronary artery disease Father        early 43s, nonsmoker   Hyperlipidemia Father    Colon polyps Father    Lymphoma  Father        age 22- in spine but broke his back- treated and doing better   Hypertension Brother    Heart disease Other        dads side   Heart disease Other        dads side   Lung cancer Other    Cancer Other        breast   Cancer Paternal Grandfather    Colon cancer Neg Hx    Breast cancer Neg Hx     Social History   Socioeconomic History   Marital status: Married    Spouse name: Not on file   Number of children: 2   Years of education: 18   Highest education level: Not on file  Occupational History   Occupation: NURSE PRACTITIONER    Employer: [employer]    Comment: Financial controller GI   Occupation: NURSE PRACTITIONER    Employer: [employer]  Tobacco Use   Smoking status: Never Smoker   Smokeless tobacco: Never Used  Substance and Sexual Activity   Alcohol use: Yes    Alcohol/week: 0.0 - 1.0 standard drinks   Drug use: No   Sexual activity: Yes    Partners: Male  Other Topics Concern   Not on file  Social History Narrative   Live with her 2 kids   1 son - '05; 1 daughter  - '03 (off at college in fall 2021)   Divorced    Prior Married '94 - 4 years, Divorced;remarried  '02 later divorced 2016 (ex took it well and still- good relationship with kids)      [name], [place] MSN, [name]       Hobbies: tennis, time with family   Social Determinants of Health   Financial Resource Strain:    Difficulty of Paying Living Expenses: Not on file  Food Insecurity:    Worried About Charity fundraiser in the Last Year: Not on file   [employer] of Food in the Last Year: Not on file  Transportation Needs:    Lack of Transportation (Medical): Not on file   Lack of Transportation (Non-Medical): Not on file  Physical Activity:    Days of Exercise per Week: Not on file   Minutes of Exercise per Session: Not on file  Stress:    Feeling of Stress : Not on file  Social Connections:    Frequency of Communication with Friends and  Family: Not on file   Frequency of Social Gatherings with Friends and Family: Not on file   Attends Religious Services: Not on file   Active Member of Clubs or Organizations: Not on file   Attends Archivist Meetings: Not on file   Marital Status: Not on file  Intimate Partner Violence:    Fear of Current or Ex-Partner: Not on file   Emotionally Abused: Not on file   Physically Abused: Not on file   Sexually Abused: Not  on file    Past Medical History, Surgical history, Social history, and Family history were reviewed and updated as appropriate.   Please see review of systems for further details on the patient's review from today.   Objective:   Physical Exam:  BP 130/69    Pulse 80    Wt 116 lb (52.6 kg)    BMI 19.60 kg/m   Physical Exam Constitutional:      General: She is not in acute distress. Musculoskeletal:        General: No deformity.  Neurological:     Mental Status: She is alert and oriented to person, place, and time.     Coordination: Coordination normal.  Psychiatric:        Attention and Perception: Attention and perception normal. She does not perceive auditory or visual hallucinations.        Mood and Affect: Mood normal. Mood is not anxious or depressed. Affect is not labile, blunt, angry or inappropriate.        Speech: Speech normal.        Behavior: Behavior normal.        Thought Content: Thought content normal. Thought content is not paranoid or delusional. Thought content does not include homicidal or suicidal ideation. Thought content does not include homicidal or suicidal plan.        Cognition and Memory: Cognition and memory normal.        Judgment: Judgment normal.     Comments: Insight intact     Lab Review:     Component Value Date/Time   NA 143 11/22/2018 1553   K 4.3 11/22/2018 1553   CL 105 11/22/2018 1553   CO2 27 11/22/2018 1553   GLUCOSE 93 11/22/2018 1553   BUN 9 11/22/2018 1553   CREATININE 0.65 11/22/2018  1553   CALCIUM 9.7 11/22/2018 1553   PROT 6.8 11/22/2018 1553   ALBUMIN 4.4 11/22/2018 1553   AST 12 11/22/2018 1553   ALT 8 11/22/2018 1553   ALKPHOS 61 11/22/2018 1553   BILITOT 0.5 11/22/2018 1553   GFRNONAA >60 08/27/2014 2240   GFRAA >60 08/27/2014 2240       Component Value Date/Time   WBC 5.1 11/22/2018 1553   RBC 4.27 11/22/2018 1553   HGB 13.4 11/22/2018 1553   HCT 39.9 11/22/2018 1553   PLT 301.0 11/22/2018 1553   MCV 93.5 11/22/2018 1553   MCH 28.8 08/27/2014 2240   MCHC 33.5 11/22/2018 1553   RDW 12.8 11/22/2018 1553   LYMPHSABS 2.0 08/13/2015 1027   MONOABS 0.4 08/13/2015 1027   EOSABS 0.1 08/13/2015 1027   BASOSABS 0.0 08/13/2015 1027    No results found for: POCLITH, LITHIUM   No results found for: PHENYTOIN, PHENOBARB, VALPROATE, CBMZ   .res Assessment: Plan:    Plan:  PDMP reviewed  1. Lexapro 10mg  daily 2. Adderall XR 10mg  capsule 3. Lunesta 2mg  at hs  Read and reviewed note with patient for accuracy.   RTC 4 weeks  Patient advised to contact office with any questions, adverse effects, or acute worsening in signs and symptoms.  Discussed potential benefits, risks, and side effects of stimulants with patient to include increased heart rate, palpitations, insomnia, increased anxiety, increased irritability, or decreased appetite.  Instructed patient to contact office if experiencing any significant tolerability issues.      Diagnoses and all orders for this visit:  Major depressive disorder, recurrent episode, moderate (HCC) -     amphetamine-dextroamphetamine (ADDERALL XR) 10 MG  24 hr capsule; Take 1 capsule (10 mg total) by mouth daily.  Insomnia, unspecified type -     eszopiclone (LUNESTA) 2 MG TABS tablet; Take 1 tablet (2 mg total) by mouth at bedtime as needed for sleep. Take immediately before bedtime     Please see After Visit Summary for patient specific instructions.  No future appointments.  No orders of the defined  types were placed in this encounter.   -------------------------------

## 2019-11-22 ENCOUNTER — Telehealth: Payer: Self-pay | Admitting: Adult Health

## 2019-11-22 NOTE — Telephone Encounter (Signed)
We can try Trazadone 50mg  or Restoril 30mg  at hs.

## 2019-11-22 NOTE — Telephone Encounter (Signed)
Reports Amanda Alvarez is not effective

## 2019-11-22 NOTE — Telephone Encounter (Signed)
Pt was reporting on Lanesta you had given her to try for sleep. It is not working. Please send something else to try to pharmacy. Contact @ [phone]. F/U due Dec, not scheduled. [place]

## 2019-11-24 ENCOUNTER — Other Ambulatory Visit: Payer: Self-pay

## 2019-11-24 ENCOUNTER — Other Ambulatory Visit: Payer: Self-pay | Admitting: Adult Health

## 2019-11-24 MED ORDER — TEMAZEPAM 30 MG PO CAPS: 30.0000 mg | ORAL_CAPSULE | Freq: Every evening | ORAL | 0 refills | Status: DC | PRN

## 2019-11-24 MED FILL — TEMAZEPAM 30 MG CAPSULE: 30 | 30 days supply | Qty: 30 | Fill #0

## 2019-11-24 NOTE — Telephone Encounter (Signed)
Rtc to patient and she would like to try Restoril. Sent a Rx for Restoril 30 mg cap #30 to [facility] (she is closer to them today) Discontinued Lunesta  Instructed her to call back next week if not effective.

## 2019-11-27 NOTE — Telephone Encounter (Signed)
Noted  

## 2019-12-06 ENCOUNTER — Other Ambulatory Visit: Payer: Self-pay | Admitting: Adult Health

## 2019-12-06 DIAGNOSIS — F331 Major depressive disorder, recurrent, moderate: Secondary | ICD-10-CM

## 2019-12-07 MED FILL — ADDERALL XR 10 MG CAP SA: 10 | 30 days supply | Qty: 30 | Fill #0

## 2019-12-12 ENCOUNTER — Encounter: Payer: Self-pay | Admitting: Family Medicine

## 2019-12-12 ENCOUNTER — Other Ambulatory Visit: Payer: Self-pay

## 2019-12-12 ENCOUNTER — Other Ambulatory Visit: Payer: Self-pay | Admitting: Family Medicine

## 2019-12-12 MED ORDER — ESCITALOPRAM OXALATE 20 MG PO TABS
20.0000 mg | ORAL_TABLET | Freq: Every day | ORAL | 3 refills | Status: DC
Start: 2019-12-12 — End: 2020-05-13

## 2019-12-13 MED FILL — ESCITALOPRAM 20 MG TABLET: 20 | 90 days supply | Qty: 90 | Fill #0

## 2019-12-25 ENCOUNTER — Ambulatory Visit (INDEPENDENT_AMBULATORY_CARE_PROVIDER_SITE_OTHER): Payer: 59 | Admitting: Adult Health

## 2019-12-25 ENCOUNTER — Other Ambulatory Visit: Payer: Self-pay | Admitting: Adult Health

## 2019-12-25 ENCOUNTER — Other Ambulatory Visit: Payer: Self-pay

## 2019-12-25 ENCOUNTER — Encounter: Payer: Self-pay | Admitting: Adult Health

## 2019-12-25 DIAGNOSIS — F331 Major depressive disorder, recurrent, moderate: Secondary | ICD-10-CM | POA: Diagnosis not present

## 2019-12-25 DIAGNOSIS — G4700 Insomnia, unspecified: Secondary | ICD-10-CM

## 2019-12-25 MED ORDER — AMPHETAMINE-DEXTROAMPHET ER 10 MG PO CP24: 10.0000 mg | ORAL_CAPSULE | Freq: Every day | ORAL | 0 refills | Status: DC

## 2019-12-25 MED ORDER — TEMAZEPAM 30 MG PO CAPS: 30.0000 mg | ORAL_CAPSULE | Freq: Every evening | ORAL | 0 refills | Status: DC | PRN

## 2019-12-25 MED FILL — TEMAZEPAM 30 MG CAPSULE: 30 | 90 days supply | Qty: 90 | Fill #0

## 2019-12-25 NOTE — Progress Notes (Signed)
Amanda Alvarez [id number] 1965/05/02 54 y.o.  Subjective:   [name] is a 54 y.o. (DOB Feb 18, 1965) female.  Chief Complaint: No chief complaint on file.   HPI Amanda Alvarez presents to the office today for follow-up of MDD and insomnia.  Describes mood today as "ok". Pleasant. Denies tearfulness. Mood symptoms - denies depression, anxiety, and irritability. Stating "I'm doing good". Stable interest and motivation. Taking medications as prescribed.  Energy levels stable. Active, has a regular exercise routine.  Enjoys some usual interests and activities. Single. Lives with son age 34 years old. Daughter a Museum/gallery exhibitions officer at [employer]. Spending time with family. Appetite adequate. Weight stable - 117 pounds. Sleeps well most nights. Averages 6 to 7 hours with Restoril. Focus and concentration stable. Completing tasks. Managing aspects of household. 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  Denies SI or HI.  Denies AH or VH.  Previous medication trials: Provigil    PHQ2-9     Video Visit from 11/03/2019 in [facility] Video Visit from 06/02/2019 in [facility] Visit from 11/22/2018 in [facility]  PHQ-2 Total Score 6 2 0  PHQ-9 Total Score 12 8 3        Review of Systems:  Review of Systems  Musculoskeletal: Negative for gait problem.  Neurological: Negative for tremors.  Psychiatric/Behavioral:       Please refer to HPI    Medications: I have reviewed the patient's current medications.  Current Outpatient Medications  Medication Sig Dispense Refill   amphetamine-dextroamphetamine (ADDERALL XR) 10 MG 24 hr capsule Take 1 capsule (10 mg total) by mouth daily. 90 capsule 0   cholecalciferol (VITAMIN D3) 25 MCG (1000 UNIT) tablet Take 1,000 Units by mouth daily.     escitalopram (LEXAPRO) 20 MG tablet Take 1 tablet (20 mg total) by mouth daily. 90 tablet 3    estradiol-norethindrone (ACTIVELLA) 1-0.5 MG tablet Take 1 tablet by mouth daily.     LINZESS 145 MCG CAPS capsule TAKE 1 CAPSULE (145 MCG TOTAL) BY MOUTH DAILY. (Patient taking differently: Take 145 mcg by mouth as needed. ) 30 capsule 5   phenazopyridine (PYRIDIUM) 200 MG tablet Take 1 tablet (200 mg total) by mouth 3 (three) times daily as needed for pain. (Patient taking differently: Take 200 mg by mouth 3 (three) times daily as needed for pain. For bladder infection) 15 tablet 0   SUMAtriptan (IMITREX) 25 MG tablet Take 1 tablet (25 mg total) by mouth every 2 (two) hours as needed for migraine (max 2 per day). May repeat in 2 hours if headache persists 10 tablet 1   temazepam (RESTORIL) 30 MG capsule Take 1 capsule (30 mg total) by mouth at bedtime as needed for sleep. 90 capsule 0   No current facility-administered medications for this visit.    Medication Side Effects: None  Allergies:  Allergies  Allergen Reactions   Ambien [Zolpidem Tartrate]     Sleep walking/activity    Past Medical History:  Diagnosis Date   Cough 08/26/2014   GAD (generalized anxiety disorder)    Major depressive disorder, single episode, severe, without mention of psychotic behavior    OCD (obsessive compulsive disorder)    Urinary tract infection, site not specified    chronic since childhood. Has had multiple eval: cysto x 3 all with chronic inflammation with lymphocytis invasion with plasma cells. for suppresion    Family History  Problem Relation Age of Onset  Atrial fibrillation Mother    Hyperlipidemia Mother    Hypothyroidism Mother    Coronary artery disease Father        early 80s, nonsmoker   Hyperlipidemia Father    Colon polyps Father    Lymphoma Father        age 30- in spine but broke his back- treated and doing better   Hypertension Brother    Heart disease Other        dads side   Heart disease Other        dads side   Lung cancer Other    Cancer Other         breast   Cancer Paternal Grandfather    Colon cancer Neg Hx    Breast cancer Neg Hx     Social History   Socioeconomic History   Marital status: Married    Spouse name: Not on file   Number of children: 2   Years of education: 18   Highest education level: Not on file  Occupational History   Occupation: NURSE PRACTITIONER    Employer: [employer]    Comment: Financial controller [employer]   Occupation: NURSE PRACTITIONER    Employer: [employer]  Tobacco Use   Smoking status: Never Smoker   Smokeless tobacco: Never Used  Substance and Sexual Activity   Alcohol use: Yes    Alcohol/week: 0.0 - 1.0 standard drinks   Drug use: No   Sexual activity: Yes    Partners: Male  Other Topics Concern   Not on file  Social History Narrative   Live with her 2 kids   1 son - '05; 1 daughter  - '03 (off at college in fall 2021)   Divorced    Prior Married '94 - 4 years, Divorced;remarried  '02 later divorced 2016 (ex took it well and still- good relationship with kids)      [name], [place] MSN, [name]       Hobbies: tennis, time with family   Social Determinants of Health   Financial Resource Strain:    Difficulty of Paying Living Expenses: Not on file  Food Insecurity:    Worried About Charity fundraiser in the Last Year: Not on file   [employer] of Food in the Last Year: Not on file  Transportation Needs:    Lack of Transportation (Medical): Not on file   Lack of Transportation (Non-Medical): Not on file  Physical Activity:    Days of Exercise per Week: Not on file   Minutes of Exercise per Session: Not on file  Stress:    Feeling of Stress : Not on file  Social Connections:    Frequency of Communication with Friends and Family: Not on file   Frequency of Social Gatherings with Friends and Family: Not on file   Attends Religious Services: Not on file   Active Member of Clubs or Organizations: Not on file   Attends Archivist  Meetings: Not on file   Marital Status: Not on file  Intimate Partner Violence:    Fear of Current or Ex-Partner: Not on file   Emotionally Abused: Not on file   Physically Abused: Not on file   Sexually Abused: Not on file    Past Medical History, Surgical history, Social history, and Family history were reviewed and updated as appropriate.   Please see review of systems for further details on the patient's review from today.   Objective:  Physical Exam:  There were no vitals taken for this visit.  Physical Exam Constitutional:      General: She is not in acute distress. Musculoskeletal:        General: No deformity.  Neurological:     Mental Status: She is alert and oriented to person, place, and time.     Coordination: Coordination normal.  Psychiatric:        Attention and Perception: Attention and perception normal. She does not perceive auditory or visual hallucinations.        Mood and Affect: Mood normal. Mood is not anxious or depressed. Affect is not labile, blunt, angry or inappropriate.        Speech: Speech normal.        Behavior: Behavior normal.        Thought Content: Thought content normal. Thought content is not paranoid or delusional. Thought content does not include homicidal or suicidal ideation. Thought content does not include homicidal or suicidal plan.        Cognition and Memory: Cognition and memory normal.        Judgment: Judgment normal.     Comments: Insight intact     Lab Review:     Component Value Date/Time   NA 143 11/22/2018 1553   K 4.3 11/22/2018 1553   CL 105 11/22/2018 1553   CO2 27 11/22/2018 1553   GLUCOSE 93 11/22/2018 1553   BUN 9 11/22/2018 1553   CREATININE 0.65 11/22/2018 1553   CALCIUM 9.7 11/22/2018 1553   PROT 6.8 11/22/2018 1553   ALBUMIN 4.4 11/22/2018 1553   AST 12 11/22/2018 1553   ALT 8 11/22/2018 1553   ALKPHOS 61 11/22/2018 1553   BILITOT 0.5 11/22/2018 1553   GFRNONAA >60 08/27/2014 2240   GFRAA  >60 08/27/2014 2240       Component Value Date/Time   WBC 5.1 11/22/2018 1553   RBC 4.27 11/22/2018 1553   HGB 13.4 11/22/2018 1553   HCT 39.9 11/22/2018 1553   PLT 301.0 11/22/2018 1553   MCV 93.5 11/22/2018 1553   MCH 28.8 08/27/2014 2240   MCHC 33.5 11/22/2018 1553   RDW 12.8 11/22/2018 1553   LYMPHSABS 2.0 08/13/2015 1027   MONOABS 0.4 08/13/2015 1027   EOSABS 0.1 08/13/2015 1027   BASOSABS 0.0 08/13/2015 1027    No results found for: POCLITH, LITHIUM   No results found for: PHENYTOIN, PHENOBARB, VALPROATE, CBMZ   .res Assessment: Plan:    Plan:  PDMP reviewed  1. Lexapro 20mg  daily 2. Adderall XR 10mg  capsule 3. Restoril 30mg  at hs   RTC 6 months  Patient advised to contact office with any questions, adverse effects, or acute worsening in signs and symptoms.  Discussed potential benefits, risks, and side effects of stimulants with patient to include increased heart rate, palpitations, insomnia, increased anxiety, increased irritability, or decreased appetite.  Instructed patient to contact office if experiencing any significant tolerability issues.   Diagnoses and all orders for this visit:  Major depressive disorder, recurrent episode, moderate (HCC) -     amphetamine-dextroamphetamine (ADDERALL XR) 10 MG 24 hr capsule; Take 1 capsule (10 mg total) by mouth daily.  Insomnia, unspecified type -     temazepam (RESTORIL) 30 MG capsule; Take 1 capsule (30 mg total) by mouth at bedtime as needed for sleep.     Please see After Visit Summary for patient specific instructions.  No future appointments.  No orders of the defined types were placed in this  encounter.   -------------------------------

## 2020-01-09 MED FILL — ADDERALL XR 10 MG CAP SA: 10 | 90 days supply | Qty: 90 | Fill #0

## 2020-02-02 ENCOUNTER — Encounter: Payer: Self-pay | Admitting: Family Medicine

## 2020-02-03 ENCOUNTER — Other Ambulatory Visit: Payer: Self-pay | Admitting: Family Medicine

## 2020-02-03 MED ORDER — CEPHALEXIN 500 MG PO CAPS: 500.0000 mg | ORAL_CAPSULE | Freq: Three times a day (TID) | ORAL | 0 refills | Status: DC

## 2020-02-03 MED FILL — CEPHALEXIN 500 MG CAPSULE: 500 | 7 days supply | Qty: 21 | Fill #0

## 2020-03-14 ENCOUNTER — Encounter: Payer: Self-pay | Admitting: Family Medicine

## 2020-03-14 ENCOUNTER — Other Ambulatory Visit: Payer: Self-pay

## 2020-03-14 ENCOUNTER — Other Ambulatory Visit: Payer: Self-pay | Admitting: Family Medicine

## 2020-03-14 ENCOUNTER — Ambulatory Visit: Payer: 59 | Admitting: Family Medicine

## 2020-03-14 VITALS — BP 143/88 | HR 95 | Temp 98.2°F | Resp 96 | Wt 122.2 lb

## 2020-03-14 DIAGNOSIS — N390 Urinary tract infection, site not specified: Secondary | ICD-10-CM | POA: Diagnosis not present

## 2020-03-14 LAB — POCT URINALYSIS DIPSTICK
Bilirubin, UA: NEGATIVE
Blood, UA: NEGATIVE
Glucose, UA: NEGATIVE
Ketones, UA: NEGATIVE
Nitrite, UA: POSITIVE
Protein, UA: NEGATIVE
Spec Grav, UA: 1.015 (ref 1.010–1.025)
Urobilinogen, UA: 0.2 E.U./dL
pH, UA: 6.5 (ref 5.0–8.0)

## 2020-03-14 MED ORDER — NITROFURANTOIN MONOHYD MACRO 100 MG PO CAPS: 100.0000 mg | ORAL_CAPSULE | Freq: Two times a day (BID) | ORAL | 0 refills | Status: DC

## 2020-03-14 MED ORDER — PHENAZOPYRIDINE HCL 200 MG PO TABS: 200.0000 mg | ORAL_TABLET | Freq: Three times a day (TID) | ORAL | 0 refills | Status: DC | PRN

## 2020-03-14 MED FILL — ESTRADIOL-NORETHINDRONE ACE: 1-0.5 | 84 days supply | Qty: 84 | Fill #1

## 2020-03-14 MED FILL — NITROFURANTOIN MONO-MCR 100: 100 | 7 days supply | Qty: 14 | Fill #0

## 2020-03-14 MED FILL — PHENAZOPYRIDINE 200 MG TAB: 200 | 5 days supply | Qty: 15 | Fill #0

## 2020-03-14 NOTE — Progress Notes (Signed)
   Amanda Alvarez is a 55 y.o. female who presents today for an office visit.  Assessment/Plan:  New/Acute Problems: UTI UA and history consistent with UTI.  No signs of systemic infection.  Will start Donalds.  Also send in Pyridium.  Encourage good oral hydration.  Check urine culture.  May benefit from prophylactic antibiotics I will defer to PCP.    Subjective:  HPI:  Patient here with UTI.  Started a few days ago.  Feels similar to past UTIs.  Third UTI in the last 6 months.  Has a history of chronic UTIs that was nonissue until several months ago.  No fevers or chills.  No back pain.  Has constant burning in bladder area.       Objective:  Physical Exam: BP (!) 143/88   Pulse 95   Temp 98.2 F (36.8 C) (Temporal)   Resp (!) 96   Wt 122 lb 3.2 oz (55.4 kg)   BMI 20.65 kg/m   Gen: No acute distress, resting comfortably Neuro: Grossly normal, moves all extremities Psych: Normal affect and thought content      Melicia Esqueda M. Jerline Pain, MD 03/14/2020 4:35 PM

## 2020-03-14 NOTE — Patient Instructions (Signed)
It was nice to see you!  You have a urinary tract infection. Please start the antibiotic.  We will check a urine culture to make sure you do not have a resistant bacteria. We will call you if we need to change your medications.   Please make sure you are drinking plenty of fluids over the next few days.  If your symptoms do not improve over the next 5-7 days, or if they worsen, please let us know. Please also let us know if you have worsening back pain, fevers, chills, or body aches.   Take care, Dr Deseree Zemaitis  

## 2020-03-16 LAB — URINE CULTURE
MICRO NUMBER:: 11465020
SPECIMEN QUALITY:: ADEQUATE

## 2020-03-18 NOTE — Progress Notes (Signed)
Please inform patient of the following:  Culture confirms UTI. The macrobid we have her on should treat this. Would like for her to let us know if her symptoms are not improving.  Algis Greenhouse. Jerline Pain, MD 03/18/2020 8:13 AM

## 2020-04-10 ENCOUNTER — Other Ambulatory Visit: Payer: Self-pay | Admitting: Adult Health

## 2020-04-10 DIAGNOSIS — G4700 Insomnia, unspecified: Secondary | ICD-10-CM

## 2020-04-10 MED FILL — ESCITALOPRAM 20 MG TABLET: 20 | 90 days supply | Qty: 90 | Fill #1

## 2020-04-10 MED FILL — TEMAZEPAM 30 MG CAPSULE: 30 | 90 days supply | Qty: 90 | Fill #0

## 2020-05-13 ENCOUNTER — Other Ambulatory Visit: Payer: Self-pay | Admitting: Adult Health

## 2020-05-13 ENCOUNTER — Ambulatory Visit (INDEPENDENT_AMBULATORY_CARE_PROVIDER_SITE_OTHER): Payer: 59 | Admitting: Adult Health

## 2020-05-13 ENCOUNTER — Other Ambulatory Visit: Payer: Self-pay

## 2020-05-13 ENCOUNTER — Encounter: Payer: Self-pay | Admitting: Adult Health

## 2020-05-13 DIAGNOSIS — F331 Major depressive disorder, recurrent, moderate: Secondary | ICD-10-CM

## 2020-05-13 DIAGNOSIS — G4700 Insomnia, unspecified: Secondary | ICD-10-CM

## 2020-05-13 MED ORDER — BUPROPION HCL ER (XL) 150 MG PO TB24: 150.0000 mg | ORAL_TABLET | Freq: Every day | ORAL | 3 refills | Status: DC

## 2020-05-13 MED ORDER — AMPHETAMINE-DEXTROAMPHET ER 10 MG PO CP24: 10.0000 mg | ORAL_CAPSULE | Freq: Every day | ORAL | 0 refills | Status: DC

## 2020-05-13 MED ORDER — ESZOPICLONE 2 MG PO TABS: 2.0000 mg | ORAL_TABLET | Freq: Every evening | ORAL | 0 refills | Status: DC | PRN

## 2020-05-13 MED FILL — ADDERALL XR 10 MG CAP SA: 10 | 90 days supply | Qty: 90 | Fill #0

## 2020-05-13 MED FILL — ESZOPICLONE 2 MG TABS: 2 | 90 days supply | Qty: 90 | Fill #0

## 2020-05-13 MED FILL — buPROPion HCL ER (XL) 150 M: 150 | 90 days supply | Qty: 90 | Fill #0

## 2020-05-13 NOTE — Progress Notes (Signed)
Amanda Alvarez [id number] Sep 11, 1965 55 y.o.  Subjective:   [name] is a 55 y.o. (DOB 1965-04-30) female.  Chief Complaint: No chief complaint on file.   HPI Amanda Alvarez presents to the office today for follow-up of MDD and insomnia.  Describes mood today as "ok". Pleasant. Denies tearfulness. Mood symptoms - denies depression, anxiety, and irritability. Stating "I'm doing great". Stable interest and motivation. Taking medications as prescribed.  Energy levels stable. Active, has a regular exercise routine.  Enjoys some usual interests and activities. Single. Lives with son age 73 years old. Daughter a Museum/gallery exhibitions officer at [employer]. Spending time with family. Appetite adequate. Weight stable - 120 pounds. Sleeps well most nights. Averages 6 hours. Focus and concentration stable. Completing tasks. Managing aspects of household. 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  Denies SI or HI.  Denies AH or VH.  Previous medication trials: Provigil   PHQ2-9   Central Office Visit from 03/14/2020 in [facility] Video Visit from 11/03/2019 in [facility] Video Visit from 06/02/2019 in [facility] Visit from 11/22/2018 in [facility]  PHQ-2 Total Score 0 6 2 0  PHQ-9 Total Score -- 12 8 3        Review of Systems:  Review of Systems  Musculoskeletal: Negative for gait problem.  Neurological: Negative for tremors.  Psychiatric/Behavioral:       Please refer to HPI    Medications: I have reviewed the patient's current medications.  Current Outpatient Medications  Medication Sig Dispense Refill  . amphetamine-dextroamphetamine (ADDERALL XR) 10 MG 24 hr capsule Take 1 capsule (10 mg total) by mouth daily. 90 capsule 0  . buPROPion (WELLBUTRIN XL) 150 MG 24 hr tablet Take 1 tablet (150 mg total) by mouth daily. 90 tablet 3  . cholecalciferol (VITAMIN D3) 25  MCG (1000 UNIT) tablet Take 1,000 Units by mouth daily.    Marland Kitchen estradiol-norethindrone (ACTIVELLA) 1-0.5 MG tablet Take 1 tablet by mouth daily.    . eszopiclone (LUNESTA) 2 MG TABS tablet Take 1 tablet (2 mg total) by mouth at bedtime as needed for sleep. Take immediately before bedtime 90 tablet 0  . LINZESS 145 MCG CAPS capsule TAKE 1 CAPSULE (145 MCG TOTAL) BY MOUTH DAILY. (Patient taking differently: Take 145 mcg by mouth as needed.) 30 capsule 5  . nitrofurantoin, macrocrystal-monohydrate, (MACROBID) 100 MG capsule Take 1 capsule (100 mg total) by mouth 2 (two) times daily. 14 capsule 0  . phenazopyridine (PYRIDIUM) 200 MG tablet Take 1 tablet (200 mg total) by mouth 3 (three) times daily as needed for pain. 15 tablet 0  . SUMAtriptan (IMITREX) 25 MG tablet Take 1 tablet (25 mg total) by mouth every 2 (two) hours as needed for migraine (max 2 per day). May repeat in 2 hours if headache persists 10 tablet 1   No current facility-administered medications for this visit.    Medication Side Effects: None  Allergies:  Allergies  Allergen Reactions  . Ambien [Zolpidem Tartrate]     Sleep walking/activity    Past Medical History:  Diagnosis Date  . Cough 08/26/2014  . GAD (generalized anxiety disorder)   . Major depressive disorder, single episode, severe, without mention of psychotic behavior   . OCD (obsessive compulsive disorder)   . Urinary tract infection, site not specified    chronic since childhood. Has had multiple eval: cysto x 3 all with chronic inflammation with lymphocytis  invasion with plasma cells. for suppresion    Family History  Problem Relation Age of Onset  . Atrial fibrillation Mother   . Hyperlipidemia Mother   . Hypothyroidism Mother   . Coronary artery disease Father        early 82s, nonsmoker  . Hyperlipidemia Father   . Colon polyps Father   . Lymphoma Father        age 77- in spine but broke his back- treated and doing better  . Hypertension Brother    . Heart disease Other        dads side  . Heart disease Other        dads side  . Lung cancer Other   . Cancer Other        breast  . Cancer Paternal Grandfather   . Colon cancer Neg Hx   . Breast cancer Neg Hx     Social History   Socioeconomic History  . Marital status: Married    Spouse name: Not on file  . Number of children: 2  . Years of education: 4  . Highest education level: Not on file  Occupational History  . Occupation: NURSE PRACTITIONER    Employer: [employer]    Comment: [employer]  . Occupation: NURSE PRACTITIONER    Employer: [employer]  Tobacco Use  . Smoking status: Never Smoker  . Smokeless tobacco: Never Used  Substance and Sexual Activity  . Alcohol use: Yes    Alcohol/week: 0.0 - 1.0 standard drinks  . Drug use: No  . Sexual activity: Yes    Partners: Male  Other Topics Concern  . Not on file  Social History Narrative   Live with her 2 kids   1 son - '05; 1 daughter  - '03 (off at college in fall 2021)   Divorced    Prior Married '94 - 4 years, Divorced;remarried  '02 later divorced 2016 (ex took it well and still- good relationship with kids)      [name], [place] MSN, [name]       Hobbies: tennis, time with family   Social Determinants of Radio broadcast assistant Strain: Not on file  Food Insecurity: Not on file  Transportation Needs: Not on file  Physical Activity: Not on file  Stress: Not on file  Social Connections: Not on file  Intimate Partner Violence: Not on file    Past Medical History, Surgical history, Social history, and Family history were reviewed and updated as appropriate.   Please see review of systems for further details on the patient's review from today.   Objective:   Physical Exam:  There were no vitals taken for this visit.  Physical Exam Constitutional:      General: She is not in acute distress. Musculoskeletal:        General: No deformity.  Neurological:     Mental  Status: She is alert and oriented to person, place, and time.     Coordination: Coordination normal.  Psychiatric:        Attention and Perception: Attention and perception normal. She does not perceive auditory or visual hallucinations.        Mood and Affect: Mood normal. Mood is not anxious or depressed. Affect is not labile, blunt, angry or inappropriate.        Speech: Speech normal.        Behavior: Behavior normal.        Thought Content: Thought content  normal. Thought content is not paranoid or delusional. Thought content does not include homicidal or suicidal ideation. Thought content does not include homicidal or suicidal plan.        Cognition and Memory: Cognition and memory normal.        Judgment: Judgment normal.     Comments: Insight intact     Lab Review:     Component Value Date/Time   NA 143 11/22/2018 1553   K 4.3 11/22/2018 1553   CL 105 11/22/2018 1553   CO2 27 11/22/2018 1553   GLUCOSE 93 11/22/2018 1553   BUN 9 11/22/2018 1553   CREATININE 0.65 11/22/2018 1553   CALCIUM 9.7 11/22/2018 1553   PROT 6.8 11/22/2018 1553   ALBUMIN 4.4 11/22/2018 1553   AST 12 11/22/2018 1553   ALT 8 11/22/2018 1553   ALKPHOS 61 11/22/2018 1553   BILITOT 0.5 11/22/2018 1553   GFRNONAA >60 08/27/2014 2240   GFRAA >60 08/27/2014 2240       Component Value Date/Time   WBC 5.1 11/22/2018 1553   RBC 4.27 11/22/2018 1553   HGB 13.4 11/22/2018 1553   HCT 39.9 11/22/2018 1553   PLT 301.0 11/22/2018 1553   MCV 93.5 11/22/2018 1553   MCH 28.8 08/27/2014 2240   MCHC 33.5 11/22/2018 1553   RDW 12.8 11/22/2018 1553   LYMPHSABS 2.0 08/13/2015 1027   MONOABS 0.4 08/13/2015 1027   EOSABS 0.1 08/13/2015 1027   BASOSABS 0.0 08/13/2015 1027    No results found for: POCLITH, LITHIUM   No results found for: PHENYTOIN, PHENOBARB, VALPROATE, CBMZ   .res Assessment: Plan:    Plan:  PDMP reviewed  1. Taper off Lexapro 20mg  daily - 1/2 tab daily x 7, then d/c 2. Adderall XR  10mg  capsule 3. D/C Restoril 30mg  at hs 4. Add Wellbutrin XL150mg  daily 5. Restart Lunesta 2mg    133/86 80   RTC 6 months  Patient advised to contact office with any questions, adverse effects, or acute worsening in signs and symptoms.  Discussed potential benefits, risks, and side effects of stimulants with patient to include increased heart rate, palpitations, insomnia, increased anxiety, increased irritability, or decreased appetite.  Instructed patient to contact office if experiencing any significant tolerability issues.     Diagnoses and all orders for this visit:  Insomnia, unspecified type -     eszopiclone (LUNESTA) 2 MG TABS tablet; Take 1 tablet (2 mg total) by mouth at bedtime as needed for sleep. Take immediately before bedtime  Major depressive disorder, recurrent episode, moderate (HCC) -     amphetamine-dextroamphetamine (ADDERALL XR) 10 MG 24 hr capsule; Take 1 capsule (10 mg total) by mouth daily. -     buPROPion (WELLBUTRIN XL) 150 MG 24 hr tablet; Take 1 tablet (150 mg total) by mouth daily.     Please see After Visit Summary for patient specific instructions.  No future appointments.  No orders of the defined types were placed in this encounter.   -------------------------------

## 2020-05-17 ENCOUNTER — Other Ambulatory Visit (HOSPITAL_COMMUNITY): Payer: Self-pay | Admitting: *Deleted

## 2020-05-23 ENCOUNTER — Other Ambulatory Visit: Payer: Self-pay

## 2020-05-23 ENCOUNTER — Ambulatory Visit (HOSPITAL_BASED_OUTPATIENT_CLINIC_OR_DEPARTMENT_OTHER)
Admission: RE | Admit: 2020-05-23 | Discharge: 2020-05-23 | Disposition: A | Discharge status: Home / Self Care | Payer: 59 | Source: Ambulatory Visit | Attending: Cardiology | Admitting: Cardiology

## 2020-07-04 ENCOUNTER — Telehealth: Payer: 59 | Admitting: Physician Assistant

## 2020-07-04 ENCOUNTER — Other Ambulatory Visit (HOSPITAL_COMMUNITY): Payer: Self-pay

## 2020-07-04 DIAGNOSIS — R3989 Other symptoms and signs involving the genitourinary system: Secondary | ICD-10-CM | POA: Diagnosis not present

## 2020-07-04 MED ORDER — SULFAMETHOXAZOLE-TRIMETHOPRIM 800-160 MG PO TABS
1.0000 | ORAL_TABLET | Freq: Two times a day (BID) | ORAL | 0 refills | Status: DC
  Filled 2020-07-04: qty 10, 5d supply, fill #0

## 2020-07-04 NOTE — Progress Notes (Signed)
We are sorry that you are not feeling well.  Here is how we plan to help!  Based on what you shared with me it looks like you most likely have a simple urinary tract infection.  A UTI (Urinary Tract Infection) is a bacterial infection of the bladder.  Most cases of urinary tract infections are simple to treat but a key part of your care is to encourage you to drink plenty of fluids and watch your symptoms carefully.  I have prescribed Bactrim DS One tablet twice a day for 5 days.  Your symptoms should gradually improve. Call us if the burning in your urine worsens, you develop worsening fever, back pain or pelvic pain or if your symptoms do not resolve after completing the antibiotic.  Urinary tract infections can be prevented by drinking plenty of water to keep your body hydrated.  Also be sure when you wipe, wipe from front to back and don't hold it in!  If possible, empty your bladder every 4 hours.  Your e-visit answers were reviewed by a board certified advanced clinical practitioner to complete your personal care plan.  Depending on the condition, your plan could have included both over the counter or prescription medications.  If there is a problem please reply  once you have received a response from your provider.  Your safety is important to us.  If you have drug allergies check your prescription carefully.    You can use MyChart to ask questions about today's visit, request a non-urgent call back, or ask for a work or school excuse for 24 hours related to this e-Visit. If it has been greater than 24 hours you will need to follow up with your provider, or enter a new e-Visit to address those concerns.   You will get an e-mail in the next two days asking about your experience.  I hope that your e-visit has been valuable and will speed your recovery. Thank you for using e-visits.  I provided 5 minutes of non face-to-face time during this encounter for chart review and documentation.    

## 2020-07-05 ENCOUNTER — Ambulatory Visit: Payer: 59 | Admitting: Family Medicine

## 2020-07-05 ENCOUNTER — Encounter: Payer: Self-pay | Admitting: Family Medicine

## 2020-07-05 ENCOUNTER — Other Ambulatory Visit: Payer: Self-pay

## 2020-07-05 ENCOUNTER — Other Ambulatory Visit (HOSPITAL_COMMUNITY): Payer: Self-pay

## 2020-07-05 VITALS — BP 124/88 | HR 87 | Temp 98.0°F | Ht 65.0 in | Wt 118.0 lb

## 2020-07-05 DIAGNOSIS — R300 Dysuria: Secondary | ICD-10-CM | POA: Diagnosis not present

## 2020-07-05 DIAGNOSIS — R109 Unspecified abdominal pain: Secondary | ICD-10-CM | POA: Diagnosis not present

## 2020-07-05 DIAGNOSIS — N390 Urinary tract infection, site not specified: Secondary | ICD-10-CM

## 2020-07-05 LAB — URINALYSIS, ROUTINE W REFLEX MICROSCOPIC
Hgb urine dipstick: NEGATIVE
Ketones, ur: NEGATIVE
Nitrite: POSITIVE — AB
Specific Gravity, Urine: 1.02 (ref 1.000–1.030)
Urine Glucose: 100 — AB
Urobilinogen, UA: 2 — AB (ref 0.0–1.0)
pH: 6 (ref 5.0–8.0)

## 2020-07-05 MED ORDER — PHENAZOPYRIDINE HCL 200 MG PO TABS
200.0000 mg | ORAL_TABLET | Freq: Three times a day (TID) | ORAL | 1 refills | Status: DC | PRN
Start: 2020-07-05 — End: 2020-10-16
  Filled 2020-07-05: qty 30, 10d supply, fill #0

## 2020-07-05 MED ORDER — NITROFURANTOIN MONOHYD MACRO 100 MG PO CAPS
100.0000 mg | ORAL_CAPSULE | Freq: Every day | ORAL | 3 refills | Status: DC
  Filled 2020-07-05: qty 90, 90d supply, fill #0

## 2020-07-05 NOTE — Progress Notes (Signed)
Subjective   CC:  Chief Complaint  Patient presents with  . Urinary Tract Infection    Burning when urinating, abdominal pain. Took a dose of bactrim and AZO     HPI: Amanda Alvarez is a 55 y.o. female who presents to the office today to address the problems listed above in the chief complaint.  Patient reports dysuria and urinary frequency.  She has sensation of increased urinary pressure.  She denies fevers flank pain nausea vomiting or gross hematuria.  Symptoms have been present for several hours to days.  She denies history of interstitial cystitis.  She denies vaginal symptoms including vaginal discharge or pelvic pain.  However she has a history of chronic recurrent urinary tract infections.  She has been followed by urology in the past.  She has had multiple evaluations for other etiologies, all negative.  She had been on Macrobid prophylaxis in the past but stopped due to concerns regarding low risk for C. difficile infections.  Over the last year or so, she has been having more recurrent infections again.  Last documented UTI from our office was in January.  She did have an E visit yesterday and was prescribed Septra, she is taken 2 doses prior to her visit today.  She denies any atypical symptoms currently.  Assessment  1. Recurrent UTI      Plan   Recurrent UTI: Continue Septra, await culture.  Patient has been using Pyridium and requests refill.  Culture may be negative due to prior use of antibiotics.  Patient is aware.  She will start Macrobid 100 mg daily for prophylaxis once this infection is resolved.  She has follow-up with her PCP for her annual next month.  Follow up: As scheduled for CPE Orders Placed This Encounter  Procedures  . Urine Culture  . Urinalysis, Routine w reflex microscopic   Meds ordered this encounter  Medications  . nitrofurantoin, macrocrystal-monohydrate, (MACROBID) 100 MG capsule    Sig: Take 1 capsule (100 mg total) by mouth daily.     Dispense:  90 capsule    Refill:  3  . phenazopyridine (PYRIDIUM) 200 MG tablet    Sig: Take 1 tablet (200 mg total) by mouth 3 (three) times daily as needed for pain.    Dispense:  30 tablet    Refill:  1      I reviewed the patients updated PMH, FH, and SocHx.    Patient Active Problem List   Diagnosis Date Noted  . GERD (gastroesophageal reflux disease) 09/28/2017  . Hyperlipidemia 03/05/2017  . 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 Constipation 08/13/2015  . Anemia 08/13/2015  . Depression 12/13/2007  . Chronic UTI 08/22/2007   Current Meds  Medication Sig  . amphetamine-dextroamphetamine (ADDERALL XR) 10 MG 24 hr capsule TAKE 1 CAPSULE BY MOUTH ONCE A DAY  . buPROPion (WELLBUTRIN XL) 150 MG 24 hr tablet TAKE 1 TABLET BY MOUTH ONCE A DAY  . cholecalciferol (VITAMIN D3) 25 MCG (1000 UNIT) tablet Take 1,000 Units by mouth daily.  Marland Kitchen estradiol-norethindrone (ACTIVELLA) 1-0.5 MG tablet Take 1 tablet by mouth daily.  Marland Kitchen estradiol-norethindrone (ACTIVELLA) 1-0.5 MG tablet TAKE 1 TABLET BY MOUTH ONCE DAILY  . eszopiclone (LUNESTA) 2 MG TABS tablet TAKE 1 TABLET BY MOUTH EVERY NIGHT AT BEDTIME AS NEEDED FOR SLEEP. TAKE IMMEDIATELY BEFORE BREAKFAST  . LINZESS 145 MCG CAPS capsule TAKE 1 CAPSULE (145 MCG TOTAL) BY MOUTH DAILY. (Patient taking differently: Take 145 mcg by mouth as needed.)  .  nitrofurantoin, macrocrystal-monohydrate, (MACROBID) 100 MG capsule Take 1 capsule (100 mg total) by mouth daily.  . phenazopyridine (PYRIDIUM) 200 MG tablet Take 1 tablet (200 mg total) by mouth 3 (three) times daily as needed for pain.  Marland Kitchen sulfamethoxazole-trimethoprim (BACTRIM DS) 800-160 MG tablet Take 1 tablet by mouth 2 (two) times daily.  . [DISCONTINUED] phenazopyridine (PYRIDIUM) 200 MG tablet TAKE 1 TABLET BY MOUTH 3 TIMES DAILY AS NEEDED FOR PAIN    Review of Systems: Cardiovascular: negative for chest pain Respiratory: negative for SOB or persistent cough Gastrointestinal: negative for  abdominal pain Constitutional: Negative for fever malaise or anorexia  Objective  Vitals: BP 124/88   Pulse 87   Temp 98 F (36.7 C) (Temporal)   Ht 5\' 5"  (1.651 m)   Wt 118 lb (53.5 kg)   SpO2 93%   BMI 19.64 kg/m  General: no acute distress  Psych:  Alert and oriented, normal mood and affect  No visits with results within 1 Day(s) from this visit.  Latest known visit with results is:  Office Visit on 03/14/2020  Component Date Value Ref Range Status  . Color, UA 03/14/2020 yellow   Final  . Clarity, UA 03/14/2020 clear   Final  . Glucose, UA 03/14/2020 Negative  Negative Final  . Bilirubin, UA 03/14/2020 negative   Final  . Ketones, UA 03/14/2020 nega   Final  . Spec Grav, UA 03/14/2020 1.015  1.010 - 1.025 Final  . Blood, UA 03/14/2020 negative   Final  . pH, UA 03/14/2020 6.5  5.0 - 8.0 Final  . Protein, UA 03/14/2020 Negative  Negative Final  . Urobilinogen, UA 03/14/2020 0.2  0.2 or 1.0 E.U./dL Final  . Nitrite, UA 03/14/2020 positive   Final  . Leukocytes, UA 03/14/2020 4+* Negative Final  . MICRO NUMBER: 03/14/2020 [id number]   Final  . SPECIMEN QUALITY: 03/14/2020 Adequate   Final  . Sample Source 03/14/2020 URINE   Final  . STATUS: 03/14/2020 FINAL   Final  . ISOLATE 1: 03/14/2020 Escherichia coli*  Final    Commons side effects, risks, benefits, and alternatives for medications and treatment plan prescribed today were discussed, and the patient expressed understanding of the given instructions. Patient is instructed to call or message via MyChart if he/she has any questions or concerns regarding our treatment plan. No barriers to understanding were identified. We discussed Red Flag symptoms and signs in detail. Patient expressed understanding regarding what to do in case of urgent or emergency type symptoms.   Medication list was reconciled, printed and provided to the patient in AVS. Patient instructions and summary information was reviewed with the patient as  documented in the AVS. This note was prepared with assistance of Dragon voice recognition software. Occasional wrong-word or sound-a-like substitutions may have occurred due to the inherent limitations of voice recognition software

## 2020-07-05 NOTE — Patient Instructions (Signed)
Please follow up if symptoms do not improve or as needed.   I will release your lab results to you on your MyChart account with further instructions. Please reply with any questions.   

## 2020-07-06 LAB — URINE CULTURE
MICRO NUMBER:: 11916180
Result:: NO GROWTH
SPECIMEN QUALITY:: ADEQUATE

## 2020-07-11 ENCOUNTER — Other Ambulatory Visit (HOSPITAL_COMMUNITY): Payer: Self-pay

## 2020-07-11 MED FILL — Estradiol & Norethindrone Acetate Tab 1-0.5 MG: ORAL | 84 days supply | Qty: 84 | Fill #0 | Status: AC

## 2020-07-12 ENCOUNTER — Other Ambulatory Visit (HOSPITAL_COMMUNITY): Payer: Self-pay

## 2020-07-12 MED ORDER — OMRON 3 SERIES BP MONITOR DEVI
0 refills | Status: DC
  Filled 2020-07-12: qty 1, 30d supply, fill #0

## 2020-07-29 ENCOUNTER — Telehealth: Payer: Self-pay

## 2020-07-29 ENCOUNTER — Encounter: Payer: Self-pay | Admitting: Family Medicine

## 2020-07-29 ENCOUNTER — Telehealth (INDEPENDENT_AMBULATORY_CARE_PROVIDER_SITE_OTHER): Payer: 59 | Admitting: Family Medicine

## 2020-07-29 ENCOUNTER — Other Ambulatory Visit (HOSPITAL_COMMUNITY): Payer: Self-pay

## 2020-07-29 DIAGNOSIS — U071 COVID-19: Secondary | ICD-10-CM | POA: Diagnosis not present

## 2020-07-29 MED ORDER — NIRMATRELVIR/RITONAVIR (PAXLOVID)TABLET
3.0000 | ORAL_TABLET | Freq: Two times a day (BID) | ORAL | 0 refills | Status: AC
  Filled 2020-07-29: qty 30, 5d supply, fill #0

## 2020-07-29 NOTE — Progress Notes (Signed)
Virtual Visit via Video   I connected with patient on 07/29/20 at  9:30 AM EDT by a video enabled telemedicine application and verified that I am speaking with the correct person using two identifiers.  Location patient: Home Location provider: [facility] Office Persons participating in the virtual visit: Patient, Provider, [place] (Sabrina M)  I discussed the limitations of evaluation and management by telemedicine and the availability of in person appointments. The patient expressed understanding and agreed to proceed.  Interactive audio and video telecommunications were attempted between this provider and patient, however failed, due to patient having technical difficulties OR patient did not have access to video capability.  We continued and completed visit with audio only.   Subjective:   HPI:   COVID- Was exposed last week at the hospital.  Wilburn Mylar 'i really started to feel bad'.  Tm 101.  Home COVID test +.  + body aches.  + cough, congestion, sore throat- 'feels like razor blades'.    ROS:   See pertinent positives and negatives per HPI.  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   Constipation 08/13/2015   Anemia 08/13/2015   Depression 12/13/2007   Chronic UTI 08/22/2007    Social History   Tobacco Use   Smoking status: Never   Smokeless tobacco: Never  Substance Use Topics   Alcohol use: Yes    Alcohol/week: 0.0 - 1.0 standard drinks    Current Outpatient Medications:    amphetamine-dextroamphetamine (ADDERALL XR) 10 MG 24 hr capsule, TAKE 1 CAPSULE BY MOUTH ONCE A DAY, Disp: 90 capsule, Rfl: 0   Blood Pressure Monitoring (OMRON 3 SERIES BP MONITOR) DEVI, Use as directed, Disp: 1 each, Rfl: 0   buPROPion (WELLBUTRIN XL) 150 MG 24 hr tablet, TAKE 1 TABLET BY MOUTH ONCE A DAY, Disp: 90 tablet, Rfl: 3   cholecalciferol  (VITAMIN D3) 25 MCG (1000 UNIT) tablet, Take 1,000 Units by mouth daily., Disp: , Rfl:    estradiol-norethindrone (ACTIVELLA) 1-0.5 MG tablet, Take 1 tablet by mouth daily., Disp: , Rfl:    eszopiclone (LUNESTA) 2 MG TABS tablet, TAKE 1 TABLET BY MOUTH EVERY NIGHT AT BEDTIME AS NEEDED FOR SLEEP. TAKE IMMEDIATELY BEFORE BREAKFAST, Disp: 90 tablet, Rfl: 0   LINZESS 145 MCG CAPS capsule, TAKE 1 CAPSULE (145 MCG TOTAL) BY MOUTH DAILY. (Patient taking differently: Take 145 mcg by mouth as needed.), Disp: 30 capsule, Rfl: 5   nitrofurantoin, macrocrystal-monohydrate, (MACROBID) 100 MG capsule, Take 1 capsule (100 mg total) by mouth daily., Disp: 90 capsule, Rfl: 3   phenazopyridine (PYRIDIUM) 200 MG tablet, Take 1 tablet (200 mg total) by mouth 3 (three) times daily as needed for pain., Disp: 30 tablet, Rfl: 1   estradiol-norethindrone (ACTIVELLA) 1-0.5 MG tablet, TAKE 1 TABLET BY MOUTH ONCE DAILY (Patient not taking: Reported on 07/29/2020), Disp: 84 tablet, Rfl: 3   sulfamethoxazole-trimethoprim (BACTRIM DS) 800-160 MG tablet, Take 1 tablet by mouth 2 (two) times daily. (Patient not taking: Reported on 07/29/2020), Disp: 10 tablet, Rfl: 0   SUMAtriptan (IMITREX) 25 MG tablet, Take 1 tablet (25 mg total) by mouth every 2 (two) hours as needed for migraine (max 2 per day). May repeat in 2 hours if headache persists (Patient not taking: No sig reported), Disp: 10 tablet, Rfl: 1  Allergies  Allergen Reactions   Ambien [Zolpidem Tartrate]     Sleep walking/activity  Objective:   There were no vitals taken for this visit. Pt is able to speak clearly, coherently without shortness of breath or increased work of breathing. + hoarseness + cough Thought process is linear.  Mood is appropriate.  Assessment and Plan:   COVID- new.  Pt tested + yesterday.  Discussed possibility of starting Paxlovid- appropriate use, possible side effects.  Thankfully her risk of serious COVID is very low, but she is  interested in getting back to work as soon as possible.  Will send prescription.  Also discussed supportive measures- vit D, vit C, Zinc, tylenol/ibuprofen, decongestants.  Will enroll in home monitoring program.  Pt expressed understanding and is in agreement w/ plan.    Annye Asa, MD 07/29/2020  Time spent with the patient: 9 minutes, of which >50% was spent in obtaining information about symptoms, reviewing previous labs, evaluations, and treatments, counseling about condition (please see the discussed topics above), and developing a plan to further investigate it; had a number of questions which I addressed.

## 2020-07-29 NOTE — Telephone Encounter (Signed)
Nurse Assessment Nurse: Cherre Robins, RN, Ria Comment Date/Time (Eastern Time): 07/28/2020 9:01:53 AM Confirm and document reason for call. If symptomatic, describe symptoms. ---Caller states she is covid positive via a home test this morning. Sx started yesterday. C/o temp of 101 oral, sore throat, aches and congestion. Does the patient have any new or worsening symptoms? ---Yes Will a triage be completed? ---Yes Related visit to physician within the last 2 weeks? ---No Does the PT have any chronic conditions? (i.e. diabetes, asthma, this includes High risk factors for pregnancy, etc.) ---Yes List chronic conditions. ---depression Is the patient pregnant or possibly pregnant? (Ask all females between the ages of 73-55) ---No Is this a behavioral health or substance abuse call? ---No Guidelines Guideline Title Affirmed Question Affirmed Notes Nurse Date/Time (Eastern Time) COVID-19 - Diagnosed or Suspected [1] COVID-19 diagnosed by positive lab test (e.g., PCR, rapid self-test kit) AND [2] mild symptoms Weiss-Hilton, RN, Ria Comment 07/28/2020 9:03:44 AM PLEASE NOTE: All timestamps contained within this report are represented as [place] Standard Time. CONFIDENTIALTY NOTICE: This fax transmission is intended only for the addressee. It contains information that is legally privileged, confidential or otherwise protected from use or disclosure. If you are not the intended recipient, you are strictly prohibited from reviewing, disclosing, copying using or disseminating any of this information or taking any action in reliance on or regarding this information. If you have received this fax in error, please notify us immediately by telephone so that we can arrange for its return to [place]. Phone: [phone], Toll-Free: [phone], Fax: [phone] Page: 2 of 2 Call Id: [id number] Guidelines Guideline Title Affirmed Question Affirmed Notes Nurse Date/Time Eilene Ghazi Time) (e.g., cough,  fever, others) AND [2] no complications or SOB Disp. Time Eilene Ghazi Time) Disposition Final User 07/28/2020 9:08:48 AM Home Care Yes Weiss-Hilton, RN, Ivonne Andrew Disagree/Comply Comply Caller Understands Yes PreDisposition InappropriateToAsk Care Advice Given Per Guideline HOME CARE: * You should be able to treat this at home. REASSURANCE AND EDUCATION - POSITIVE COVID-19 LAB TEST AND MILD SYMPTOMS: * A positive result on a PCR or rapid self-test kit is highly accurate for diagnosing COVID-19. It is highly likely that you have COVID-19. * From what you have told me, your symptoms are mild. That is reassuring. * The symptoms are generally treated the same whether you have COVID-19, influenza or some other respiratory virus. GENERAL CARE ADVICE FOR COVID-19 SYMPTOMS: * Feeling dehydrated: Drink extra liquids. If the air in your home is dry, use a humidifier. * Cough: Use cough drops. * Fever: For fever over 101 F (38.3 C), take acetaminophen every 4 to 6 hours (Adults 650 mg) OR ibuprofen every 6 to 8 hours (Adults 400 mg). Before taking any medicine, read all the instructions on the package. Do not take aspirin unless your doctor has prescribed it for you. * Muscle aches, headache, and other pains: Often this comes and goes with the fever. Take acetaminophen every 4 to 6 hours (Adults 650 mg) OR ibuprofen every 6 to 8 hours (Adults 400 mg). Before taking any medicine, read all the instructions on the package. * Sore throat: Try throat lozenges, hard candy or warm chicken broth. HUMIDIFIER: * If the air is dry, use a humidifier in the bedroom. * Dry air makes coughs worse. NO ASPIRIN: * Do not use aspirin for treatment of fever or pain. CALL BACK IF: * Fever over 103 F (39.4 C) * Fever lasts over 3 days * Fever returns after being gone for 24 hours * Chest pain or  difficulty breathing occurs * You become worse CARE ADVICE given per COVID-19 - DIAGNOSED OR SUSPECTED (Adult)  guideline. Comments User: Carolan Clines, RN Date/Time Eilene Ghazi Time): 07/28/2020 9:11:33 AM RN advised caller per client directives medications are not called in after hours and to f/u with the office tomorrow with her medication request. Caller voiced understanding. Referrals REFERRED TO PCP OFFICE

## 2020-07-29 NOTE — Progress Notes (Signed)
I connected with  Amanda Alvarez on 07/29/20 by a video enabled telemedicine application and verified that I am speaking with the correct person using two identifiers.   I discussed the limitations of evaluation and management by telemedicine. The patient expressed understanding and agreed to proceed.

## 2020-07-31 ENCOUNTER — Other Ambulatory Visit (HOSPITAL_BASED_OUTPATIENT_CLINIC_OR_DEPARTMENT_OTHER): Payer: Self-pay

## 2020-08-21 ENCOUNTER — Other Ambulatory Visit (HOSPITAL_COMMUNITY): Payer: Self-pay

## 2020-08-21 ENCOUNTER — Other Ambulatory Visit: Payer: Self-pay | Admitting: Adult Health

## 2020-08-21 DIAGNOSIS — F331 Major depressive disorder, recurrent, moderate: Secondary | ICD-10-CM

## 2020-08-21 DIAGNOSIS — G4700 Insomnia, unspecified: Secondary | ICD-10-CM

## 2020-08-21 MED FILL — Bupropion HCl Tab ER 24HR 150 MG: ORAL | 90 days supply | Qty: 90 | Fill #0 | Status: AC

## 2020-08-22 ENCOUNTER — Other Ambulatory Visit (HOSPITAL_COMMUNITY): Payer: Self-pay

## 2020-08-22 MED ORDER — AMPHETAMINE-DEXTROAMPHET ER 10 MG PO CP24
ORAL_CAPSULE | Freq: Every day | ORAL | 0 refills | Status: DC
  Filled 2020-08-22: qty 90, 90d supply, fill #0

## 2020-08-22 MED ORDER — ESZOPICLONE 2 MG PO TABS
ORAL_TABLET | ORAL | 0 refills | Status: DC
  Filled 2020-08-22: qty 90, 90d supply, fill #0

## 2020-08-22 NOTE — Telephone Encounter (Signed)
Both filled 3/28 appt on 9/26

## 2020-08-23 ENCOUNTER — Other Ambulatory Visit (HOSPITAL_COMMUNITY): Payer: Self-pay

## 2020-08-30 ENCOUNTER — Encounter: Payer: Self-pay | Admitting: Family Medicine

## 2020-08-30 ENCOUNTER — Ambulatory Visit (INDEPENDENT_AMBULATORY_CARE_PROVIDER_SITE_OTHER): Payer: 59 | Admitting: Family Medicine

## 2020-08-30 ENCOUNTER — Other Ambulatory Visit: Payer: Self-pay

## 2020-08-30 VITALS — BP 126/79 | HR 85 | Temp 98.4°F | Ht 65.0 in | Wt 116.2 lb

## 2020-08-30 DIAGNOSIS — Z0000 Encounter for general adult medical examination without abnormal findings: Secondary | ICD-10-CM | POA: Diagnosis not present

## 2020-08-30 DIAGNOSIS — M8580 Other specified disorders of bone density and structure, unspecified site: Secondary | ICD-10-CM

## 2020-08-30 NOTE — Patient Instructions (Addendum)
Health Maintenance Due  Topic Date Due   Zoster Vaccines- Shingrix (1 of 2) - will hold off until next visit Never done   PAP SMEAR-Modifier Sign release of information at the check out desk for GYN 04/22/2019   MAMMOGRAM Sign release of information at the check out desk for GYN 06/03/2019    Recommended follow up: Return in about 1 year (around 08/30/2021) for physical or sooner if needed.

## 2020-08-30 NOTE — Progress Notes (Signed)
Phone [phone]   Subjective:  Patient presents today for their annual physical. Chief complaint-noted.   See problem oriented charting- ROS- full  review of systems was completed and negative except for: eye pain (for about a year has noted stinging and has to close eyes- then eyes start tearing- no specific location, rims of eyes get red- has been told "looks stoned" and obviousy she is not using drugs- no clear link- no dry mouth with this)  The following were reviewed and entered/updated in epic: Past Medical History:  Diagnosis Date   Cough 08/26/2014   GAD (generalized anxiety disorder)    Major depressive disorder, single episode, severe, without mention of psychotic behavior    OCD (obsessive compulsive disorder)    Urinary tract infection, site not specified    chronic since childhood. Has had multiple eval: cysto x 3 all with chronic inflammation with lymphocytis invasion with plasma cells. 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    Priority: Low   Constipation 08/13/2015    Priority: Low   GERD (gastroesophageal reflux disease) 09/28/2017   Hyperlipidemia 03/05/2017   Anemia 08/13/2015   Past Surgical History:  Procedure Laterality Date   CYSTOSCOPY     with uretheral stretches    Family History  Problem Relation Age of Onset   Atrial fibrillation Mother    Hyperlipidemia Mother    Hypothyroidism Mother    Coronary artery disease Father        early 55s, nonsmoker   Hyperlipidemia Father    Colon polyps Father    Lymphoma Father        age 59- in spine but broke his back- treated and doing better   Hypertension Brother    Heart disease Other        dads side   Heart disease Other        dads side   Lung cancer Other    Cancer Other        breast    Cancer Paternal Grandfather    Colon cancer Neg Hx    Breast cancer Neg Hx     Medications- reviewed and updated Current Outpatient Medications  Medication Sig Dispense Refill   amphetamine-dextroamphetamine (ADDERALL XR) 10 MG 24 hr capsule TAKE 1 CAPSULE BY MOUTH ONCE A DAY (Patient taking differently: Take by mouth as needed.) 90 capsule 0   buPROPion (WELLBUTRIN XL) 150 MG 24 hr tablet TAKE 1 TABLET BY MOUTH ONCE A DAY 90 tablet 3   cholecalciferol (VITAMIN D3) 25 MCG (1000 UNIT) tablet Take 1,000 Units by mouth daily.     estradiol-norethindrone (ACTIVELLA) 1-0.5 MG tablet Take 1 tablet by mouth daily.     eszopiclone (LUNESTA) 2 MG TABS tablet TAKE 1 TABLET BY MOUTH AT BEDTIME AS NEEDED FOR SLEEP. TAKE IMMEDIATELY BEFORE BREAKFAST 90 tablet 0   LINZESS 145 MCG CAPS capsule TAKE 1 CAPSULE (145 MCG TOTAL) BY MOUTH DAILY. (Patient taking differently: Take 145 mcg by mouth as needed.) 30 capsule 5   nitrofurantoin, macrocrystal-monohydrate, (MACROBID) 100 MG capsule Take 1 capsule (100 mg total) by mouth daily. 90 capsule 3   phenazopyridine (PYRIDIUM) 200 MG tablet Take 1 tablet (200 mg total) by mouth 3 (three) times daily as needed for pain. 30 tablet  1   No current facility-administered medications for this visit.    Allergies-reviewed and updated Allergies  Allergen Reactions   Ambien [Zolpidem Tartrate]     Patient states this is NOT an allergy she just doesn't like taking the medication     Social History   Social History Narrative   Live with her 2 kids   1 son - '05; 1 daughter  - '03 (off at college in fall 2021)   Divorced    Prior Married '94 - 4 years, Divorced;remarried  '02 later divorced 2016 (ex took it well and still- good relationship with kids)      [name], [place] MSN, [name]       Hobbies: tennis, time with family   Objective  Objective:  BP 126/79   Pulse 85   Temp 98.4 F (36.9 C) (Temporal)   Ht 5\' 5"  (1.651 m)   Wt 116 lb 3.2 oz  (52.7 kg)   SpO2 97%   BMI 19.34 kg/m  Gen: NAD, resting comfortably HEENT: Mucous membranes are moist. Oropharynx normal Neck: no thyromegaly CV: RRR no murmurs rubs or gallops Lungs: CTAB no crackles, wheeze, rhonchi Abdomen: soft/nontender/nondistended/normal bowel sounds. No rebound or guarding.  Ext: no edema Skin: warm, dry Neuro: grossly normal, moves all extremities, PERRLA   Assessment and Plan   55 y.o. female presenting for annual physical.  Health Maintenance counseling: 1. Anticipatory guidance: Patient counseled regarding regular dental exams -q6 months, eye exams - optometry a year ago but will see optho,  avoiding smoking and second hand smoke , limiting alcohol to 1 beverage per day .   2. Risk factor reduction:  Advised patient of need for regular exercise and diet rich and fruits and vegetables to reduce risk of heart attack and stroke. Exercise- may hike a few times a month and not sedentary- doesn't always translate to cardio- may get 3.5 miles a day at hospital. Diet-reasonably healthy- thinks could cut down on carbs. Vegetarian- takes vitamin D Wt Readings from Last 3 Encounters:  08/30/20 116 lb 3.2 oz (52.7 kg)  07/05/20 118 lb (53.5 kg)  03/14/20 122 lb 3.2 oz (55.4 kg)  3. Immunizations/screenings/ancillary studies- wants to do shingrix at later date Immunization History  Administered Date(s) Administered   Influenza,inj,Quad PF,6+ Mos 11/30/2013, 11/22/2018   Influenza-Unspecified 11/20/2016   PFIZER(Purple Top)SARS-COV-2 Vaccination 02/06/2019, 02/27/2019   Tdap 03/05/2017  4. Cervical cancer screening- up to date- we need a copy 5. Breast cancer screening-  breast exam with GYN and mammogram - we need a copy 6. Colon cancer screening - 01/31/15 with Dr. Paulita Fujita with 10 year most likely but she will double check.  7. Skin cancer screening- does not see dermatology. advised regular sunscreen use- actually covers up well for most part. Denies worrisome,  changing, or new skin lesions.  8. Birth control/STD check- long term dating- does not need STD screening as monogamous. Likely Postmenopausal but has mirena in. Also HRT through GYN 9. Osteoporosis screening at 2- osteopenia- we will try to get records from GYN -Never smoker  Status of chronic or acute concerns   # Depression/ADD/insomnia- managed by psychiatry S: Medication:Patient currently on Wellbutrin 150 mg extended release as well as Adderall 10 mg 24-hour daily -She is also on Lunesta 2 mg daily Depression screen [facility] 2/9 07/29/2020 03/14/2020 11/03/2019  Decreased Interest 0 0 3  Down, Depressed, Hopeless 0 0 3  PHQ - 2 Score 0 0 6  Altered sleeping 0 -  3  Tired, decreased energy 0 - 0  Change in appetite 0 - 0  Feeling bad or failure about yourself  0 - 0  Trouble concentrating 0 - 3  Moving slowly or fidgety/restless 0 - 0  Suicidal thoughts 0 - 0  PHQ-9 Score 0 - 12  Difficult doing work/chores Not difficult at all - Somewhat difficult  A/P: doing well at present- continue current meds. Had a really bad stalking situation with ex bf that was tough- finally found right balance -sparing adderall if in office seeing patients- only time  #hyperlipidemia/ coronary calcium scoring of 2022 through Dr. day S: Medication: none  Lab Results  Component Value Date   CHOL 179 11/22/2018   HDL 58.00 11/22/2018   LDLCALC 106 (H) 11/22/2018   TRIG 74.0 11/22/2018   CHOLHDL 3 11/22/2018   A/P: with coronary calcium score of 0 remain off statin at least 5-10 years could reassess at that ime. She declines labs for now  #Recurrent UTI-Dr. Jonni Sanger placed patient on daily nitrofurantoin for prophylaxis of UTIs. Only using postcoital and using well- about once a week -has pyridium if needed  #Constipation-patient on Linzess 145 mg as needed for constipation  Recommended follow up: Return in about 1 year (around 08/30/2021) for physical or sooner if needed. Future Appointments  Date Time  Provider [place]  11/11/2020  8:40 AM Mozingo, Berdie Ogren, NP CP-CP None   Lab/Order associations: NOT fasting   ICD-10-CM   1. Preventative health care  Z00.00     2. Osteopenia, unspecified location  M85.80      Return precautions advised.  Garret Reddish, MD

## 2020-09-06 ENCOUNTER — Encounter: Payer: Self-pay | Admitting: Family Medicine

## 2020-10-16 ENCOUNTER — Ambulatory Visit (INDEPENDENT_AMBULATORY_CARE_PROVIDER_SITE_OTHER): Payer: 59 | Admitting: Physician Assistant

## 2020-10-16 DIAGNOSIS — M25512 Pain in left shoulder: Secondary | ICD-10-CM

## 2020-10-16 DIAGNOSIS — G8929 Other chronic pain: Secondary | ICD-10-CM | POA: Diagnosis not present

## 2020-10-16 NOTE — Progress Notes (Signed)
Virtual Visit via Video Note  I connected with  Amanda Alvarez  on 10/16/20 at  4:00 PM EDT by a video enabled telemedicine application and verified that I am speaking with the correct person using two identifiers.  Location: Patient: home Provider: Therapist, music at [place] present: Patient and myself   I discussed the limitations of evaluation and management by telemedicine and the availability of in person appointments. The patient expressed understanding and agreed to proceed.   History of Present Illness: Progressive LEFT shoulder pain in the last year. Lifting 40 lb bag cat litter over her shoulder a few months ago worsened the pain. Feels like it hasn't improved. Can raise to about level of ear, further than that, it hurts, also if she crosses over her chest to scratch back, this causes pain. Internal rotation seems to aggravate it the most. Hurts into deltoid and distally towards elbow. No pain without movement. No pain with palpation & she cannot reproduce it.  Wakes her up at night. She has tried some of her own PT at home for shoulder impingement / rotator cuff issues Tried Ibuprofen without much relief. No falls. No prior injuries. Works as GI NP.    Observations/Objective:  Speech is clear and concise. No sounds of distress.  Assessment and Plan:  1. Chronic left shoulder pain Based on phone conversation, sounds like possible rotator involvement or impingement syndrome. Will start with XRAY outpatient and refer to PT. Pending her progress, may need further workup if worse or no improvement.    Follow Up Instructions:    I discussed the assessment and treatment plan with the patient. The patient was provided an opportunity to ask questions and all were answered. The patient agreed with the plan and demonstrated an understanding of the instructions.   The patient was advised to call back or seek an in-person evaluation if the symptoms worsen or  if the condition fails to improve as anticipated.  Video connection was lost at >50% of the duration of the visit, at which time the remainder of the visit was completed via audio only.   Total time spent 15 minutes and 2 seconds.  Devario Bucklew M Lakyn Mantione, PA-C

## 2020-10-17 ENCOUNTER — Ambulatory Visit (INDEPENDENT_AMBULATORY_CARE_PROVIDER_SITE_OTHER)
Admission: RE | Admit: 2020-10-17 | Discharge: 2020-10-17 | Disposition: A | Discharge status: Home / Self Care | Payer: 59 | Source: Ambulatory Visit | Attending: Physician Assistant | Admitting: Physician Assistant

## 2020-10-17 ENCOUNTER — Other Ambulatory Visit: Payer: Self-pay

## 2020-10-17 DIAGNOSIS — G8929 Other chronic pain: Secondary | ICD-10-CM

## 2020-10-17 DIAGNOSIS — M25512 Pain in left shoulder: Secondary | ICD-10-CM | POA: Diagnosis not present

## 2020-11-04 ENCOUNTER — Encounter: Payer: Self-pay | Admitting: Physical Therapy

## 2020-11-04 ENCOUNTER — Other Ambulatory Visit: Payer: Self-pay

## 2020-11-04 ENCOUNTER — Ambulatory Visit: Payer: 59 | Admitting: Physical Therapy

## 2020-11-04 DIAGNOSIS — G8929 Other chronic pain: Secondary | ICD-10-CM | POA: Diagnosis not present

## 2020-11-04 DIAGNOSIS — M25512 Pain in left shoulder: Secondary | ICD-10-CM | POA: Diagnosis not present

## 2020-11-04 NOTE — Patient Instructions (Signed)
Access Code: [email or web address] URL: [email or web address] Date: 11/04/2020 Prepared by: Lyndee Hensen  Exercises Supine Shoulder Flexion Extension AAROM with Dowel - 2 x daily - 1 sets - 10 reps - 5 hold Supine Shoulder External Rotation in 45 Degrees Abduction AAROM with Dowel - 2 x daily - 1 sets - 10 reps Supine Chest Stretch with Elbows Bent - 2 x daily - 1 sets - 10 reps - 5 hold Standing Row with Anchored Resistance - 1 x daily - 2 sets - 10 reps Doorway Pec Stretch at 90 Degrees Abduction - 2 x daily - 3 reps - 30 hold

## 2020-11-08 ENCOUNTER — Other Ambulatory Visit (HOSPITAL_COMMUNITY): Payer: Self-pay

## 2020-11-08 ENCOUNTER — Other Ambulatory Visit: Payer: Self-pay

## 2020-11-08 MED ORDER — ESTRADIOL-NORETHINDRONE ACET 1-0.5 MG PO TABS
ORAL_TABLET | ORAL | 0 refills | Status: DC
  Filled 2020-11-08: qty 84, 84d supply, fill #0

## 2020-11-10 ENCOUNTER — Encounter: Payer: Self-pay | Admitting: Physical Therapy

## 2020-11-11 ENCOUNTER — Encounter: Payer: Self-pay | Admitting: Adult Health

## 2020-11-11 ENCOUNTER — Other Ambulatory Visit (HOSPITAL_COMMUNITY): Payer: Self-pay

## 2020-11-11 ENCOUNTER — Ambulatory Visit: Payer: 59 | Admitting: Adult Health

## 2020-11-11 ENCOUNTER — Ambulatory Visit: Payer: 59 | Admitting: Physical Therapy

## 2020-11-11 ENCOUNTER — Other Ambulatory Visit: Payer: Self-pay

## 2020-11-11 DIAGNOSIS — G8929 Other chronic pain: Secondary | ICD-10-CM

## 2020-11-11 DIAGNOSIS — G4700 Insomnia, unspecified: Secondary | ICD-10-CM

## 2020-11-11 DIAGNOSIS — F331 Major depressive disorder, recurrent, moderate: Secondary | ICD-10-CM

## 2020-11-11 DIAGNOSIS — M25512 Pain in left shoulder: Secondary | ICD-10-CM

## 2020-11-11 MED ORDER — ESZOPICLONE 2 MG PO TABS
ORAL_TABLET | ORAL | 0 refills | Status: DC
Start: 2020-11-11 — End: 2021-03-10
  Filled 2020-11-11: qty 90, fill #0
  Filled 2020-11-22: qty 90, 90d supply, fill #0

## 2020-11-11 MED ORDER — BUPROPION HCL ER (XL) 150 MG PO TB24
ORAL_TABLET | Freq: Every day | ORAL | 3 refills | Status: DC
Start: 2020-11-11 — End: 2021-06-30
  Filled 2020-11-11: qty 90, 90d supply, fill #0

## 2020-11-11 MED ORDER — AMPHETAMINE-DEXTROAMPHET ER 10 MG PO CP24
ORAL_CAPSULE | Freq: Every day | ORAL | 0 refills | Status: DC
Start: 2020-11-11 — End: 2021-03-10
  Filled 2020-11-11: qty 90, fill #0
  Filled 2020-11-22: qty 90, 90d supply, fill #0

## 2020-11-11 NOTE — Progress Notes (Signed)
Amanda Alvarez [id number] 1965/05/22 55 y.o.  Subjective:   [name] is a 55 y.o. (DOB 27-Sep-1965) female.  Chief Complaint: No chief complaint on file.   HPI Amanda Alvarez presents to the office today for follow-up of MDD and insomnia.  Describes mood today as "ok". Pleasant. Denies tearfulness. Mood symptoms - denies depression, anxiety, and irritability. Stating "I'm doing good". Stable interest and motivation. Taking medications as prescribed.  Energy levels stable. Active, has a regular exercise routine.  Enjoys some usual interests and activities. Single. Lives with son age 53 years old. Daughter a Administrator, arts at [employer]. Spending time with family. Appetite adequate. Weight stable - 120 pounds. Sleeps well most nights. Averages 6 hours. Focus and concentration stable. Completing tasks. Managing aspects of household. 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  Denies SI or HI.  Denies AH or VH.  Previous medication trials: Provigil   PHQ2-9    Flowsheet Row Video Visit from 07/29/2020 in [facility] Office Visit from 03/14/2020 in [facility] Video Visit from 11/03/2019 in [place] Video Visit from 06/02/2019 in [place] Visit from 11/22/2018 in [facility]  PHQ-2 Total Score 0 0 6 2 0  PHQ-9 Total Score 0 -- 12 8 3         Review of Systems:  Review of Systems  Musculoskeletal:  Negative for gait problem.  Neurological:  Negative for tremors.  Psychiatric/Behavioral:         Please refer to HPI   Medications: I have reviewed the patient's current medications.  Current Outpatient Medications  Medication Sig Dispense Refill   amphetamine-dextroamphetamine (ADDERALL XR) 10 MG 24 hr capsule TAKE 1 CAPSULE BY MOUTH ONCE A DAY 90 capsule 0   buPROPion (WELLBUTRIN XL) 150 MG 24 hr tablet TAKE 1 TABLET BY  MOUTH ONCE A DAY 90 tablet 3   cholecalciferol (VITAMIN D3) 25 MCG (1000 UNIT) tablet Take 1,000 Units by mouth daily.     estradiol-norethindrone (ACTIVELLA) 1-0.5 MG tablet Take 1 tablet by mouth daily.     estradiol-norethindrone (ACTIVELLA) 1-0.5 MG tablet Take 1 tablet by mouth every day 84 tablet 0   eszopiclone (LUNESTA) 2 MG TABS tablet TAKE 1 TABLET BY MOUTH AT BEDTIME AS NEEDED FOR SLEEP. TAKE IMMEDIATELY BEFORE BREAKFAST 90 tablet 0   LINZESS 145 MCG CAPS capsule TAKE 1 CAPSULE (145 MCG TOTAL) BY MOUTH DAILY. (Patient taking differently: Take 145 mcg by mouth as needed.) 30 capsule 5   nitrofurantoin, macrocrystal-monohydrate, (MACROBID) 100 MG capsule Take 1 capsule (100 mg total) by mouth daily. 90 capsule 3   No current facility-administered medications for this visit.    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 Has had multiple eval: cysto x 3 all with chronic inflammation with lymphocytis invasion with plasma cells. for suppresion    Past Medical History, Surgical history, Social history, and Family history were reviewed and updated as appropriate.  Please see review of systems for further details on the patient's review from today.   Objective:   Physical Exam:  There were no vitals taken for this visit.  Physical Exam Constitutional:      General: She is not in acute distress. Musculoskeletal:        General: No deformity.  Neurological:     Mental Status: She is alert and oriented to person, place, and time.      Coordination: Coordination normal.  Psychiatric:        Attention and Perception: Attention and perception normal. She does not perceive auditory or visual hallucinations.        Mood and Affect: Mood normal. Mood is not anxious or depressed. Affect is not labile, blunt, angry or inappropriate.        Speech: Speech normal.        Behavior: Behavior normal.        Thought Content: Thought content normal. Thought content is not paranoid or delusional. Thought content does not include homicidal or suicidal ideation. Thought content does not include homicidal or suicidal plan.        Cognition and Memory: Cognition and memory normal.        Judgment: Judgment normal.     Comments: Insight intact    Lab Review:     Component Value Date/Time   NA 143 11/22/2018 1553   K 4.3 11/22/2018 1553   CL 105 11/22/2018 1553   CO2 27 11/22/2018 1553   GLUCOSE 93 11/22/2018 1553   BUN 9 11/22/2018 1553   CREATININE 0.65 11/22/2018 1553   CALCIUM 9.7 11/22/2018 1553   PROT 6.8 11/22/2018 1553   ALBUMIN 4.4 11/22/2018 1553   AST 12 11/22/2018 1553   ALT 8 11/22/2018 1553   ALKPHOS 61 11/22/2018 1553   BILITOT 0.5 11/22/2018 1553   GFRNONAA >60 08/27/2014 2240   GFRAA >60 08/27/2014 2240       Component Value Date/Time   WBC 5.1 11/22/2018 1553   RBC 4.27 11/22/2018 1553   HGB 13.4 11/22/2018 1553   HCT 39.9 11/22/2018 1553   PLT 301.0 11/22/2018 1553   MCV 93.5 11/22/2018 1553   MCH 28.8 08/27/2014 2240   MCHC 33.5 11/22/2018 1553   RDW 12.8 11/22/2018 1553   LYMPHSABS 2.0 08/13/2015 1027   MONOABS 0.4 08/13/2015 1027   EOSABS 0.1 08/13/2015 1027   BASOSABS 0.0 08/13/2015 1027    No results found for: POCLITH, LITHIUM   No results found for: PHENYTOIN, PHENOBARB, VALPROATE, CBMZ   .res Assessment: Plan:    Plan:  PDMP reviewed  Adderall XR 10mg  capsule Wellbutrin XL150mg  daily Lunesta 2mg    BP WNL per patient  RTC 6 months  Patient advised to contact office with  any questions, adverse effects, or acute worsening in signs and symptoms.  Discussed potential benefits, risks, and side effects of stimulants with patient to include increased heart rate, palpitations, insomnia, increased anxiety, increased irritability, or decreased appetite.  Instructed patient to contact office if experiencing any significant tolerability issues.    Diagnoses and all orders for this visit:  Major depressive disorder, recurrent episode, moderate (HCC) -     buPROPion (WELLBUTRIN XL) 150 MG 24 hr tablet; TAKE 1 TABLET BY MOUTH ONCE A DAY -     amphetamine-dextroamphetamine (ADDERALL XR) 10 MG 24 hr capsule; TAKE 1 CAPSULE BY MOUTH ONCE A DAY  Insomnia, unspecified type -     eszopiclone (LUNESTA) 2 MG TABS tablet; TAKE  1 TABLET BY MOUTH AT BEDTIME AS NEEDED FOR SLEEP. TAKE IMMEDIATELY BEFORE BREAKFAST    Please see After Visit Summary for patient specific instructions.  Future Appointments  Date Time Provider Helper  11/11/2020 10:15 AM Lyndee Hensen, PT [facility]  11/18/2020 12:15 PM Lyndee Hensen, PT [facility]  11/25/2020  8:00 AM Lyndee Hensen, PT [facility]    No orders of the defined types were placed in this encounter.   -------------------------------

## 2020-11-12 ENCOUNTER — Other Ambulatory Visit (HOSPITAL_COMMUNITY): Payer: Self-pay

## 2020-11-17 ENCOUNTER — Encounter: Payer: Self-pay | Admitting: Physical Therapy

## 2020-11-17 NOTE — Therapy (Signed)
[facility] [address] [place], [place], [address] Phone: [phone]   Fax:  [phone]  Physical Therapy Evaluation  Patient Details  Name: Amanda Alvarez MRN: [id number] Date of Birth: 1965/04/17 Referring Provider (PT): Alyssa Allwardt   Encounter Date: 11/04/2020   PT End of Session - 11/17/20 2040     Visit Number 1    Number of Visits 12    Date for PT Re-Evaluation 12/16/20    Authorization Type [facility]    PT Start Time 1216    PT Stop Time 7092    PT Time Calculation (min) 42 min    Activity Tolerance Patient tolerated treatment well    Behavior During Therapy [facility] for tasks assessed/performed             Past Medical History:  Diagnosis Date   Cough 08/26/2014   GAD (generalized anxiety disorder)    Major depressive disorder, single episode, severe, without mention of psychotic behavior    OCD (obsessive compulsive disorder)    Urinary tract infection, site not specified    chronic since childhood. Has had multiple eval: cysto x 3 all with chronic inflammation with lymphocytis invasion with plasma cells. for suppresion    Past Surgical History:  Procedure Laterality Date   CYSTOSCOPY     with uretheral stretches    There were no vitals filed for this visit.    Subjective Assessment - 11/17/20 2038     Subjective Pt states ongoing pain in L shoulder, L handed. Has pain with across the body and behind the back motions. He likes to exercise, lift weights, she likes to sleep on stomach, but is painful. Works as NP.    Limitations Lifting;House hold activities    Patient Stated Goals decreased pain.    Currently in Pain? Yes    Pain Score 7     Pain Location Shoulder    Pain Orientation Left    Pain Descriptors / Indicators Aching    Pain Type Acute pain    Pain Onset More than a month ago    Pain Frequency Intermittent    Aggravating Factors  behind back                [facility] PT Assessment -  11/17/20 0001       Assessment   Medical Diagnosis L shoulder Pain    Referring Provider (PT) Alyssa Allwardt    Hand Dominance Left    Prior Therapy no      Precautions   Precautions None      Balance Screen   Has the patient fallen in the past 6 months No      Prior Function   Level of Independence Independent      Cognition   Overall Cognitive Status Within Functional Limits for tasks assessed      AROM   Overall AROM Comments IR behind back: to sacrum/ pain;  Flexion: mild limtation for full flexion, ER: mild limitation and pain at end range      PROM   Overall PROM Comments Mild limitation for ER/IR and for end range flexion      Strength   Overall Strength Comments L: 4/5  pain with IR/ER      Palpation   Palpation comment minimal pain with palpaiton      Special Tests   Other special tests Painful crossover, Hawkins painful, behind the back IR painful.  Objective measurements completed on examination: See above findings.       [facility] Adult PT Treatment/Exercise - 11/17/20 0001       Exercises   Exercises Shoulder      Shoulder Exercises: Supine   External Rotation AROM;15 reps    External Rotation Limitations cane    Flexion 15 reps;AAROM    Flexion Limitations cane      Shoulder Exercises: Standing   Row 20 reps    Theraband Level (Shoulder Row) Level 3 (Green)      Shoulder Exercises: Stretch   Corner Stretch 3 reps;30 seconds    Corner Stretch Limitations doorway    Other Shoulder Stretches shoulder ER butterfly x 10;                     PT Education - 11/17/20 2040     Education Details PT POC, Exam findings, HEP    Person(s) Educated Patient    Methods Explanation;Demonstration;Tactile cues;Verbal cues;Handout    Comprehension Verbalized understanding;Returned demonstration;Verbal cues required;Tactile cues required;Need further instruction              PT Short Term Goals -  11/17/20 2044       PT SHORT TERM GOAL #1   Title Pt to be independent with initial HEP    Time 2    Period Weeks    Status New    Target Date 11/18/20               PT Long Term Goals - 11/17/20 2044       PT LONG TERM GOAL #1   Title Pt to be independent wtih final HEP    Time 6    Period Weeks    Status New    Target Date 12/16/20      PT LONG TERM GOAL #2   Title Pt to demo full ROM for L shoulder without pain, to improve ability for ADLs and work duties.    Time 6    Period Weeks    Status New    Target Date 12/16/20      PT LONG TERM GOAL #3   Title Pt to demo improved strength of L shoulder to 5/5 to improve ability for lift, carry, exercise, and IADLs.    Time 6    Period Weeks    Status New    Target Date 12/16/20      PT LONG TERM GOAL #4   Title Pto demo ability for lifting at least 10 lb overhead and across body, to improve ability for IADLs. and exercise.    Time 6    Period Weeks    Status New    Target Date 12/16/20                    Plan - 11/17/20 2045     Clinical Impression Statement Pt presents with primary complaint of increased pain in L shoulder. She has minimal strength deficits, but does have pain with end range ER and IR, and motion limitations for behind the back IR. Pt with decreased ability for reaching, lifting, carrying, IADLS and exercise, due to pain. Pt to benefit from skilled PT to improve deficits and pain.    Personal Factors and Comorbidities Time since onset of injury/illness/exacerbation    Examination-Activity Limitations Carry;Lift;Reach Overhead    Examination-Participation Restrictions Cleaning;Community Activity;Yard Work;Meal Prep;Occupation    Stability/Clinical Decision Making Stable/Uncomplicated    Clinical Decision Making Low  Rehab Potential Good    PT Frequency 2x / week    PT Duration 6 weeks    PT Treatment/Interventions ADLs/Self Care Home Management;Cryotherapy;Electrical  Stimulation;Iontophoresis 4mg /ml Dexamethasone;Moist Heat;Traction;Ultrasound;Neuromuscular re-education;Patient/family education;Manual techniques;Passive range of motion;Dry needling;Taping;Vasopneumatic Device;Spinal Manipulations;Joint Manipulations;DME Instruction;Functional mobility training;Therapeutic activities;Therapeutic exercise    PT Home Exercise Plan [email or web address]    Consulted and Agree with Plan of Care Patient             Patient will benefit from skilled therapeutic intervention in order to improve the following deficits and impairments:  Decreased activity tolerance, Pain, Decreased strength, Decreased mobility, Increased muscle spasms, Decreased range of motion  Visit Diagnosis: Chronic left shoulder pain     Problem List Patient Active Problem List   Diagnosis Date Noted   Osteopenia 08/30/2020   GERD (gastroesophageal reflux disease) 09/28/2017   Hyperlipidemia 03/05/2017   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 [place], [place], [address] Phone: [phone]   Fax:  [phone]  Name: Amanda Alvarez MRN: [id number] Date of Birth: 1965/04/04

## 2020-11-17 NOTE — Therapy (Signed)
[facility] [address] [place], [place], [address] Phone: [phone]   Fax:  [phone]  Physical Therapy Treatment  Patient Details  Name: Amanda Alvarez MRN: [id number] Date of Birth: 11-16-1965 Referring Provider (PT): Alyssa Allwardt   Encounter Date: 11/11/2020   PT End of Session - 11/17/20 2104     Visit Number 2    Number of Visits 12    Date for PT Re-Evaluation 12/16/20    Authorization Type [facility]    PT Start Time 4010    PT Stop Time 1056    PT Time Calculation (min) 41 min    Activity Tolerance Patient tolerated treatment well    Behavior During Therapy [facility] for tasks assessed/performed             Past Medical History:  Diagnosis Date   Cough 08/26/2014   GAD (generalized anxiety disorder)    Major depressive disorder, single episode, severe, without mention of psychotic behavior    OCD (obsessive compulsive disorder)    Urinary tract infection, site not specified    chronic since childhood. Has had multiple eval: cysto x 3 all with chronic inflammation with lymphocytis invasion with plasma cells. for suppresion    Past Surgical History:  Procedure Laterality Date   CYSTOSCOPY     with uretheral stretches    There were no vitals filed for this visit.   Subjective Assessment - 11/17/20 2103     Subjective Pt states doing HEP, same motions still painful.    Patient Stated Goals decreased pain.    Currently in Pain? Yes    Pain Score 6     Pain Location Shoulder    Pain Orientation Left    Pain Descriptors / Indicators Aching    Pain Type Acute pain    Pain Onset More than a month ago    Pain Frequency Intermittent                               [facility] Adult PT Treatment/Exercise - 11/17/20 2101       Exercises   Exercises Shoulder      Shoulder Exercises: Supine   External Rotation AROM;15 reps    External Rotation Limitations cane    Flexion 15 reps;AAROM     Flexion Limitations cane    Other Supine Exercises ER 3 lb x 15;      Shoulder Exercises: Sidelying   External Rotation 15 reps    External Rotation Weight (lbs) 2      Shoulder Exercises: Standing   External Rotation 15 reps    Theraband Level (Shoulder External Rotation) Level 3 (Green)    Internal Rotation 15 reps    Theraband Level (Shoulder Internal Rotation) Level 3 (Green)    Row 20 reps    Theraband Level (Shoulder Row) Level 3 (Green)      Shoulder Exercises: IT sales professional 3 reps;30 seconds    Corner Stretch Limitations doorway, mild pain, better with arms higher at 60-90 deg    Other Shoulder Stretches shoulder ER butterfly x 10;    Other Shoulder Stretches ShouldIR with strap behind back x15;      Modalities   Modalities Iontophoresis      Iontophoresis   Type of Iontophoresis Dexamethasone    Location L posterior shoulder    Time 4 hr patch      Manual Therapy  Manual Therapy Joint mobilization;Passive ROM    Joint Mobilization Post and Inf GHJ mobs, LAD,    Passive ROM PRom for all motions L shoulder                       PT Short Term Goals - 11/17/20 2044       PT SHORT TERM GOAL #1   Title Pt to be independent with initial HEP    Time 2    Period Weeks    Status New    Target Date 11/18/20               PT Long Term Goals - 11/17/20 2044       PT LONG TERM GOAL #1   Title Pt to be independent wtih final HEP    Time 6    Period Weeks    Status New    Target Date 12/16/20      PT LONG TERM GOAL #2   Title Pt to demo full ROM for L shoulder without pain, to improve ability for ADLs and work duties.    Time 6    Period Weeks    Status New    Target Date 12/16/20      PT LONG TERM GOAL #3   Title Pt to demo improved strength of L shoulder to 5/5 to improve ability for lift, carry, exercise, and IADLs.    Time 6    Period Weeks    Status New    Target Date 12/16/20      PT LONG TERM GOAL #4   Title Pto  demo ability for lifting at least 10 lb overhead and across body, to improve ability for IADLs. and exercise.    Time 6    Period Weeks    Status New    Target Date 12/16/20                   Plan - 11/17/20 2108     Clinical Impression Statement Pt with soreness at end range of ER and IR. Slight improvement for full flexion. Able to progress strengthening for rotation without increased pain. Continues to have pain and limitaiton for behind the back IR. Ionto added for pain. Plan to progress stength and continue manual for joint mobilizations and improved ROM.    Personal Factors and Comorbidities Time since onset of injury/illness/exacerbation    Examination-Activity Limitations Carry;Lift;Reach Overhead    Examination-Participation Restrictions Cleaning;Community Activity;Yard Work;Meal Prep;Occupation    Stability/Clinical Decision Making Stable/Uncomplicated    Rehab Potential Good    PT Frequency 2x / week    PT Duration 6 weeks    PT Treatment/Interventions ADLs/Self Care Home Management;Cryotherapy;Electrical Stimulation;Iontophoresis 4mg /ml Dexamethasone;Moist Heat;Traction;Ultrasound;Neuromuscular re-education;Patient/family education;Manual techniques;Passive range of motion;Dry needling;Taping;Vasopneumatic Device;Spinal Manipulations;Joint Manipulations;DME Instruction;Functional mobility training;Therapeutic activities;Therapeutic exercise    PT Home Exercise Plan [email or web address]    Consulted and Agree with Plan of Care Patient             Patient will benefit from skilled therapeutic intervention in order to improve the following deficits and impairments:  Decreased activity tolerance, Pain, Decreased strength, Decreased mobility, Increased muscle spasms, Decreased range of motion  Visit Diagnosis: Chronic left shoulder pain     Problem List Patient Active Problem List   Diagnosis Date Noted   Osteopenia 08/30/2020   GERD (gastroesophageal reflux disease)  09/28/2017   Hyperlipidemia 03/05/2017   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

## 2020-11-18 ENCOUNTER — Ambulatory Visit: Payer: 59 | Admitting: Physical Therapy

## 2020-11-18 ENCOUNTER — Encounter: Payer: Self-pay | Admitting: Physical Therapy

## 2020-11-18 ENCOUNTER — Other Ambulatory Visit: Payer: Self-pay

## 2020-11-18 DIAGNOSIS — G8929 Other chronic pain: Secondary | ICD-10-CM

## 2020-11-18 DIAGNOSIS — M25512 Pain in left shoulder: Secondary | ICD-10-CM

## 2020-11-22 ENCOUNTER — Other Ambulatory Visit (HOSPITAL_COMMUNITY): Payer: Self-pay

## 2020-11-22 ENCOUNTER — Encounter: Payer: Self-pay | Admitting: Physical Therapy

## 2020-11-22 NOTE — Therapy (Signed)
[facility] [address] [place], [place], [address] Phone: [phone]   Fax:  [phone]  Physical Therapy Treatment  Patient Details  Name: Amanda Alvarez MRN: [id number] Date of Birth: 1965/09/19 Referring Provider (PT): Alyssa Allwardt   Encounter Date: 11/18/2020   PT End of Session - 11/22/20 1014     Visit Number 3    Number of Visits 12    Date for PT Re-Evaluation 12/16/20    Authorization Type [facility]    PT Start Time 1220    PT Stop Time 1300    PT Time Calculation (min) 40 min    Activity Tolerance Patient tolerated treatment well    Behavior During Therapy [facility] for tasks assessed/performed             Past Medical History:  Diagnosis Date   Cough 08/26/2014   GAD (generalized anxiety disorder)    Major depressive disorder, single episode, severe, without mention of psychotic behavior    OCD (obsessive compulsive disorder)    Urinary tract infection, site not specified    chronic since childhood. Has had multiple eval: cysto x 3 all with chronic inflammation with lymphocytis invasion with plasma cells. for suppresion    Past Surgical History:  Procedure Laterality Date   CYSTOSCOPY     with uretheral stretches    There were no vitals filed for this visit.   Subjective Assessment - 11/22/20 1013     Subjective Pt states arm is still very bothersome. Still having very hard time sleeping due to pain down arm.    Currently in Pain? Yes    Pain Score 6     Pain Location Shoulder    Pain Orientation Left    Pain Descriptors / Indicators Aching    Pain Type Acute pain    Pain Onset More than a month ago    Pain Frequency Intermittent    Aggravating Factors  behind back and across body motions                               [facility] Adult PT Treatment/Exercise - 11/22/20 0001       Exercises   Exercises Shoulder      Shoulder Exercises: Supine   External Rotation AROM;15 reps     External Rotation Limitations cane    Flexion 15 reps;AAROM    Flexion Limitations cane    Other Supine Exercises ER 3 lb x 15;      Shoulder Exercises: Sidelying   External Rotation 15 reps    External Rotation Weight (lbs) 2      Shoulder Exercises: Standing   External Rotation 15 reps    Theraband Level (Shoulder External Rotation) Level 3 (Green)    Internal Rotation 15 reps    Theraband Level (Shoulder Internal Rotation) Level 3 (Green)    Row 20 reps    Theraband Level (Shoulder Row) Level 3 (Green)    Other Standing Exercises Wall push ups x 20;      Shoulder Exercises: Stretch   Corner Stretch 3 reps;30 seconds    Corner Stretch Limitations doorway, mild pain, better with arms higher at 60-90 deg    Other Shoulder Stretches shoulder ER butterfly x 10;    Other Shoulder Stretches ShouldIR with strap behind back x15;      Modalities   Modalities Iontophoresis      Manual Therapy   Manual  Therapy Joint mobilization;Passive ROM    Joint Mobilization Post and Inf GHJ mobs, LAD,    Passive ROM PRom for all motions L shoulder, contract relax for IR and ER.                       PT Short Term Goals - 11/17/20 2044       PT SHORT TERM GOAL #1   Title Pt to be independent with initial HEP    Time 2    Period Weeks    Status New    Target Date 11/18/20               PT Long Term Goals - 11/17/20 2044       PT LONG TERM GOAL #1   Title Pt to be independent wtih final HEP    Time 6    Period Weeks    Status New    Target Date 12/16/20      PT LONG TERM GOAL #2   Title Pt to demo full ROM for L shoulder without pain, to improve ability for ADLs and work duties.    Time 6    Period Weeks    Status New    Target Date 12/16/20      PT LONG TERM GOAL #3   Title Pt to demo improved strength of L shoulder to 5/5 to improve ability for lift, carry, exercise, and IADLs.    Time 6    Period Weeks    Status New    Target Date 12/16/20      PT  LONG TERM GOAL #4   Title Pto demo ability for lifting at least 10 lb overhead and across body, to improve ability for IADLs. and exercise.    Time 6    Period Weeks    Status New    Target Date 12/16/20                   Plan - 11/22/20 1015     Clinical Impression Statement Pt doing very well with ROM and strengthening in mid ranges. Continues to have quite a bit of pain with end range IR and ER, as well as sleeping. Plan to continue strengthening as tolerated and manual/modalities for pain.    Personal Factors and Comorbidities Time since onset of injury/illness/exacerbation    Examination-Activity Limitations Carry;Lift;Reach Overhead    Examination-Participation Restrictions Cleaning;Community Activity;Yard Work;Meal Prep;Occupation    Stability/Clinical Decision Making Stable/Uncomplicated    Rehab Potential Good    PT Frequency 2x / week    PT Duration 6 weeks    PT Treatment/Interventions ADLs/Self Care Home Management;Cryotherapy;Electrical Stimulation;Iontophoresis 4mg /ml Dexamethasone;Moist Heat;Traction;Ultrasound;Neuromuscular re-education;Patient/family education;Manual techniques;Passive range of motion;Dry needling;Taping;Vasopneumatic Device;Spinal Manipulations;Joint Manipulations;DME Instruction;Functional mobility training;Therapeutic activities;Therapeutic exercise    PT Home Exercise Plan [email or web address]    Consulted and Agree with Plan of Care Patient             Patient will benefit from skilled therapeutic intervention in order to improve the following deficits and impairments:  Decreased activity tolerance, Pain, Decreased strength, Decreased mobility, Increased muscle spasms, Decreased range of motion  Visit Diagnosis: Chronic left shoulder pain     Problem List Patient Active Problem List   Diagnosis Date Noted   Osteopenia 08/30/2020   GERD (gastroesophageal reflux disease) 09/28/2017   Hyperlipidemia 03/05/2017   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

## 2020-11-25 ENCOUNTER — Encounter: Payer: Self-pay | Admitting: Physical Therapy

## 2020-11-25 ENCOUNTER — Other Ambulatory Visit: Payer: Self-pay

## 2020-11-25 ENCOUNTER — Ambulatory Visit: Payer: 59 | Admitting: Physical Therapy

## 2020-11-25 DIAGNOSIS — G8929 Other chronic pain: Secondary | ICD-10-CM

## 2020-11-25 DIAGNOSIS — M25512 Pain in left shoulder: Secondary | ICD-10-CM

## 2020-11-25 NOTE — Therapy (Signed)
[facility] [address] [place], [place], [address] Phone: [phone]   Fax:  [phone]  Physical Therapy Treatment  Patient Details  Name: Amanda Alvarez MRN: [id number] Date of Birth: [birth year] Referring Provider (PT): Alyssa Allwardt   Encounter Date: 11/25/2020   PT End of Session - 11/25/20 1335     Visit Number 4    Number of Visits 12    Date for PT Re-Evaluation 12/16/20    Authorization Type [facility]    PT Start Time 0803    PT Stop Time 0843    PT Time Calculation (min) 40 min    Activity Tolerance Patient tolerated treatment well    Behavior During Therapy [facility] for tasks assessed/performed             Past Medical History:  Diagnosis Date   Cough 08/26/2014   GAD (generalized anxiety disorder)    Major depressive disorder, single episode, severe, without mention of psychotic behavior    OCD (obsessive compulsive disorder)    Urinary tract infection, site not specified    chronic since childhood. Has had multiple eval: cysto x 3 all with chronic inflammation with lymphocytis invasion with plasma cells. for suppresion    Past Surgical History:  Procedure Laterality Date   CYSTOSCOPY     with uretheral stretches    There were no vitals filed for this visit.   Subjective Assessment - 11/25/20 0809     Subjective Pt states continued pain with end range IR and ER, as well as sleeping.    Currently in Pain? Yes    Pain Score 6     Pain Location Shoulder    Pain Orientation Left    Pain Descriptors / Indicators Aching    Pain Type Acute pain    Pain Onset More than a month ago    Pain Frequency Intermittent                               [facility] Adult PT Treatment/Exercise - 11/25/20 0001       Exercises   Exercises Shoulder      Shoulder Exercises: Supine   External Rotation 15 reps    External Rotation Limitations at 90/90 , 3 lb    Flexion AROM;10 reps    Flexion  Limitations --    Other Supine Exercises --      Shoulder Exercises: Sidelying   External Rotation --    External Rotation Weight (lbs) --      Shoulder Exercises: Standing   External Rotation 15 reps    Theraband Level (Shoulder External Rotation) Level 3 (Green)    Internal Rotation 15 reps    Theraband Level (Shoulder Internal Rotation) Level 3 (Green)    Row 20 reps    Theraband Level (Shoulder Row) Level 3 (Green)    Other Standing Exercises Wall angels x 15;      Shoulder Exercises: Stretch   Corner Stretch 3 reps;30 seconds    Corner Stretch Limitations doorway, mild pain, better with arms higher at 90 deg    Other Shoulder Stretches ShouldIR with strap behind back x15;      Modalities   Modalities Iontophoresis      Manual Therapy   Manual Therapy Joint mobilization;Passive ROM    Joint Mobilization Post and Inf GHJ mobs, LAD, MWM wtih behind the back IR;    Passive ROM PRom for all  motions L shoulder, contract relax for IR and ER at end ranges/ supine                       PT Short Term Goals - 11/17/20 2044       PT SHORT TERM GOAL #1   Title Pt to be independent with initial HEP    Time 2    Period Weeks    Status New    Target Date 11/18/20               PT Long Term Goals - 11/17/20 2044       PT LONG TERM GOAL #1   Title Pt to be independent wtih final HEP    Time 6    Period Weeks    Status New    Target Date 12/16/20      PT LONG TERM GOAL #2   Title Pt to demo full ROM for L shoulder without pain, to improve ability for ADLs and work duties.    Time 6    Period Weeks    Status New    Target Date 12/16/20      PT LONG TERM GOAL #3   Title Pt to demo improved strength of L shoulder to 5/5 to improve ability for lift, carry, exercise, and IADLs.    Time 6    Period Weeks    Status New    Target Date 12/16/20      PT LONG TERM GOAL #4   Title Pto demo ability for lifting at least 10 lb overhead and across body, to  improve ability for IADLs. and exercise.    Time 6    Period Weeks    Status New    Target Date 12/16/20                   Plan - 11/25/20 1335     Clinical Impression Statement Pt with improving ROM and ability for full flexion. She does have pain at end ranges of IR and ER, but has some improvement of ROM in these motions compared to last visit. Plan to continue focus on pain, ROM and strength for IR/ER.    Personal Factors and Comorbidities Time since onset of injury/illness/exacerbation    Examination-Activity Limitations Carry;Lift;Reach Overhead    Examination-Participation Restrictions Cleaning;Community Activity;Yard Work;Meal Prep;Occupation    Stability/Clinical Decision Making Stable/Uncomplicated    Rehab Potential Good    PT Frequency 2x / week    PT Duration 6 weeks    PT Treatment/Interventions ADLs/Self Care Home Management;Cryotherapy;Electrical Stimulation;Iontophoresis 4mg /ml Dexamethasone;Moist Heat;Traction;Ultrasound;Neuromuscular re-education;Patient/family education;Manual techniques;Passive range of motion;Dry needling;Taping;Vasopneumatic Device;Spinal Manipulations;Joint Manipulations;DME Instruction;Functional mobility training;Therapeutic activities;Therapeutic exercise    PT Home Exercise Plan [email or web address]    Consulted and Agree with Plan of Care Patient             Patient will benefit from skilled therapeutic intervention in order to improve the following deficits and impairments:  Decreased activity tolerance, Pain, Decreased strength, Decreased mobility, Increased muscle spasms, Decreased range of motion  Visit Diagnosis: Chronic left shoulder pain     Problem List Patient Active Problem List   Diagnosis Date Noted   Osteopenia 08/30/2020   GERD (gastroesophageal reflux disease) 09/28/2017   Hyperlipidemia 03/05/2017   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

## 2020-12-25 ENCOUNTER — Telehealth: Payer: 59 | Admitting: Physician Assistant

## 2020-12-25 DIAGNOSIS — G8929 Other chronic pain: Secondary | ICD-10-CM

## 2020-12-25 NOTE — Progress Notes (Signed)
Virtual Visit via Video Note  I connected with  Amanda Alvarez  on 12/25/20 at 11:00 AM EST by a video enabled telemedicine application and verified that I am speaking with the correct person using two identifiers.  Location: Patient: home Provider: Therapist, music at [place] present: Patient and myself   I discussed the limitations of evaluation and management by telemedicine and the availability of in person appointments. The patient expressed understanding and agreed to proceed.   History of Present Illness:  Patient is calling in to give me an update about her chronic left shoulder pain.  She states that she did go through a full course of physical therapy with Lyndee Hensen, and this did help to get some of the range of motion back.  Patient states she is still having terrible pain now and she is now having trouble sleeping because the pain.  She is worried that she probably has a rotator cuff tear and she is going to proceed with calling Dr. Lennette Bihari Supple's office for an appointment for further work-up.  Observations/Objective:   Gen: Awake, alert, no acute distress Resp: Breathing is even and non-labored Psych: calm/pleasant demeanor   Assessment and Plan:  1. Chronic left shoulder pain -Patient is still having pain despite conservative efforts including physical therapy. - Next step is to f/up with Dr. Justice Britain.  She states she does not need a referral and she is going to call to make this appointment.   Follow Up Instructions:    I discussed the assessment and treatment plan with the patient. The patient was provided an opportunity to ask questions and all were answered. The patient agreed with the plan and demonstrated an understanding of the instructions.   The patient was advised to call back or seek an in-person evaluation if the symptoms worsen or if the condition fails to improve as anticipated.  Video connection was lost at >50% of  the duration of the visit, at which time the remainder of the visit was completed via audio only.  Total time spent with patient on the phone was 3 minutes and 20 seconds.  Amanda Alvarez M Amanda Wilms, PA-C

## 2021-01-15 ENCOUNTER — Other Ambulatory Visit (HOSPITAL_COMMUNITY): Payer: Self-pay

## 2021-01-15 DIAGNOSIS — Z1231 Encounter for screening mammogram for malignant neoplasm of breast: Secondary | ICD-10-CM | POA: Diagnosis not present

## 2021-01-15 DIAGNOSIS — Z681 Body mass index (BMI) 19 or less, adult: Secondary | ICD-10-CM | POA: Diagnosis not present

## 2021-01-15 DIAGNOSIS — Z01419 Encounter for gynecological examination (general) (routine) without abnormal findings: Secondary | ICD-10-CM | POA: Diagnosis not present

## 2021-01-15 MED ORDER — ESTRADIOL-NORETHINDRONE ACET 1-0.5 MG PO TABS
ORAL_TABLET | ORAL | 3 refills | Status: DC
  Filled 2021-01-15: qty 84, 84d supply, fill #0
  Filled 2021-08-12: qty 84, 84d supply, fill #1

## 2021-01-22 DIAGNOSIS — M25512 Pain in left shoulder: Secondary | ICD-10-CM | POA: Diagnosis not present

## 2021-01-22 DIAGNOSIS — M7502 Adhesive capsulitis of left shoulder: Secondary | ICD-10-CM | POA: Diagnosis not present

## 2021-02-04 ENCOUNTER — Encounter: Payer: Self-pay | Admitting: Physical Therapy

## 2021-02-04 ENCOUNTER — Ambulatory Visit: Payer: 59 | Admitting: Physical Therapy

## 2021-02-04 DIAGNOSIS — G8929 Other chronic pain: Secondary | ICD-10-CM

## 2021-02-04 DIAGNOSIS — M25512 Pain in left shoulder: Secondary | ICD-10-CM | POA: Diagnosis not present

## 2021-02-04 NOTE — Therapy (Signed)
[facility] [address] [place], [place], [address] Phone: [phone]   Fax:  [phone]  Physical Therapy Treatment/ Re-Eval  Patient Details  Name: Amanda Alvarez MRN: [id number] Date of Birth: [birth year] Referring Provider (PT): Alyssa Allwardt   Encounter Date: 02/04/2021   PT End of Session - 02/04/21 2131     Visit Number 5    Number of Visits 12    Date for PT Re-Evaluation 04/01/21    Authorization Type [facility] /  Re-eval at visit 5.    PT Start Time 1305    PT Stop Time 1346    PT Time Calculation (min) 41 min    Activity Tolerance Patient tolerated treatment well    Behavior During Therapy [facility] for tasks assessed/performed             Past Medical History:  Diagnosis Date   Cough 08/26/2014   GAD (generalized anxiety disorder)    Major depressive disorder, single episode, severe, without mention of psychotic behavior    OCD (obsessive compulsive disorder)    Urinary tract infection, site not specified    chronic since childhood. Has had multiple eval: cysto x 3 all with chronic inflammation with lymphocytis invasion with plasma cells. for suppresion    Past Surgical History:  Procedure Laterality Date   CYSTOSCOPY     with uretheral stretches    There were no vitals filed for this visit.   Subjective Assessment - 02/04/21 2125     Subjective Pt last seen 10/10. She continued to do her HEP, and has had continued pain and difficulty. She recently saw Dr. Onnie Graham, had injection in L shoulder, and was given dx of adhesive capsulitis. She states minimal relief from injection. She reports that her ROM is a bit better, but sill having much difficulty with elevation, and reaching across body and behind back and pain at end ranges .    Limitations Lifting;House hold activities    Patient Stated Goals decreased pain.    Currently in Pain? Yes    Pain Score 6     Pain Location Shoulder    Pain Orientation Left     Pain Descriptors / Indicators Aching    Pain Type Acute pain    Pain Onset More than a month ago    Pain Frequency Intermittent    Aggravating Factors  elevation, reaching across, behind back    Pain Relieving Factors rest/neutral positions.                [facility] PT Assessment - 02/04/21 0001       Assessment   Medical Diagnosis L shoulder Pain    Referring Provider (PT) Alyssa Allwardt    Hand Dominance Left    Prior Therapy no      Precautions   Precautions None      Balance Screen   Has the patient fallen in the past 6 months No      Prior Function   Level of Independence Independent      Cognition   Overall Cognitive Status Within Functional Limits for tasks assessed      ROM / Strength   AROM / PROM / Strength AROM      AROM   Overall AROM Comments IR Behind back: L4/5. with pain    AROM Assessment Site Shoulder    Right/Left Shoulder Left    Left Shoulder Flexion 140 Degrees    Left Shoulder ABduction 130 Degrees  Left Shoulder Internal Rotation 55 Degrees    Left Shoulder External Rotation 68 Degrees      PROM   Overall PROM Comments Mild limitation for ER/IR and for end range flexion      Strength   Overall Strength Comments L shoulder: 4+/5 in mid range positions.      Palpation   Palpation comment minimal pain with palpaiton; hypomobility and pain  at end ranges for all motions.      Special Tests   Other special tests Painful crossover, Hawkins painful, behind the back IR painful.                           [facility] Adult PT Treatment/Exercise - 02/04/21 0001       Exercises   Exercises Shoulder      Shoulder Exercises: Supine   External Rotation 15 reps;AAROM    External Rotation Limitations cane- review for HEP    Flexion 10 reps;AAROM    Flexion Limitations cane, review for HEP      Shoulder Exercises: Standing   Other Standing Exercises scap aqueeze x 10; IR behind back, 2 hands x 10;      Shoulder Exercises:  Stretch   Corner Stretch 3 reps;30 seconds    Corner Stretch Limitations doorway, mild pain, better with arms higher at 90 deg      Manual Therapy   Manual Therapy Joint mobilization;Passive ROM    Joint Mobilization Post and Inf GHJ mobs, LAD,    Passive ROM PRom for all motions L shoulder,                     PT Education - 02/04/21 2130     Education Details Reviewed HEP, and best exercises for improving ROM.    Person(s) Educated Patient    Methods Explanation;Demonstration;Tactile cues;Verbal cues;Handout    Comprehension Verbalized understanding;Returned demonstration;Verbal cues required;Tactile cues required;Need further instruction              PT Short Term Goals - 02/04/21 2132       PT SHORT TERM GOAL #1   Title Pt to be independent with initial HEP    Time 2    Period Weeks    Status Achieved               PT Long Term Goals - 02/04/21 2132       PT LONG TERM GOAL #1   Title Pt to be independent wtih final HEP    Time 6    Period Weeks    Status Partially Met    Target Date 04/01/21      PT LONG TERM GOAL #2   Title Pt to demo full ROM for L shoulder without pain, to improve ability for ADLs and work duties.    Time 6    Period Weeks    Status On-going    Target Date 04/01/21      PT LONG TERM GOAL #3   Title Pt to demo improved strength of L shoulder to 5/5 to improve ability for lift, carry, exercise, and IADLs.    Time 6    Period Weeks    Status Partially Met    Target Date 04/01/21      PT LONG TERM GOAL #4   Title Pto demo ability for lifting at least 10 lb overhead and across body, to improve ability for IADLs. and exercise.  Time 6    Period Weeks    Status On-going    Target Date 12/16/20      PT LONG TERM GOAL #5   Title 04/01/2021                   Plan - 02/04/21 2145     Clinical Impression Statement Pt presents back to PT today, with ongoing L shoulder pain. She has mild stiffness, limitation  and significant pain at end ranges for all motions. She has good strength and minimal pain in neutral positions. Pt with new diagnosis of adhesive capsulitis, and did have recent injection with minimal relief. She continues to be limited with functional activities, lifting, reaching, carrying, and exercise. She has ongoing pain and dysfunction and will benefit from skilled PT to improve.    Personal Factors and Comorbidities Time since onset of injury/illness/exacerbation    Examination-Activity Limitations Carry;Lift;Reach Overhead    Examination-Participation Restrictions Cleaning;Community Activity;Yard Work;Meal Prep;Occupation    Stability/Clinical Decision Making Stable/Uncomplicated    Rehab Potential Good    PT Frequency 2x / week    PT Duration 6 weeks    PT Treatment/Interventions ADLs/Self Care Home Management;Cryotherapy;Electrical Stimulation;Iontophoresis 68m/ml Dexamethasone;Moist Heat;Traction;Ultrasound;Neuromuscular re-education;Patient/family education;Manual techniques;Passive range of motion;Dry needling;Taping;Vasopneumatic Device;Spinal Manipulations;Joint Manipulations;DME Instruction;Functional mobility training;Therapeutic activities;Therapeutic exercise    PT Home Exercise Plan [email or web address]    Consulted and Agree with Plan of Care Patient             Patient will benefit from skilled therapeutic intervention in order to improve the following deficits and impairments:  Decreased activity tolerance, Pain, Decreased strength, Decreased mobility, Increased muscle spasms, Decreased range of motion  Visit Diagnosis: Chronic left shoulder pain     Problem List Patient Active Problem List   Diagnosis Date Noted   Osteopenia 08/30/2020   GERD (gastroesophageal reflux disease) 09/28/2017   Hyperlipidemia 03/05/2017   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

## 2021-02-04 NOTE — Patient Instructions (Signed)
Access Code: [email or web address] URL: [email or web address] Date: 02/04/2021 Prepared by: Lyndee Hensen  Exercises Supine Shoulder Flexion Extension AAROM with Dowel - 2 x daily - 1 sets - 10 reps - 5 hold Supine Shoulder External Rotation in 45 Degrees Abduction AAROM with Dowel - 2 x daily - 1 sets - 10 reps Supine Chest Stretch with Elbows Bent - 2 x daily - 1 sets - 10 reps - 5 hold Standing Row with Anchored Resistance - 1 x daily - 2 sets - 10 reps Doorway Pec Stretch at 90 Degrees Abduction - 2 x daily - 3 reps - 30 hold Sidelying Shoulder ER with Towel and Dumbbell - 1 x daily - 2 sets - 10 reps Shoulder External Rotation with Anchored Resistance - 1 x daily - 2 sets - 10 reps Shoulder Internal Rotation with Resistance - 1 x daily - 2 sets - 10 reps Standing Shoulder Internal Rotation Stretch with Hands Behind Back - 1 x daily - 1 sets - 10 reps - 5 hold

## 2021-03-10 ENCOUNTER — Other Ambulatory Visit (HOSPITAL_COMMUNITY): Payer: Self-pay

## 2021-03-10 ENCOUNTER — Other Ambulatory Visit: Payer: Self-pay | Admitting: Adult Health

## 2021-03-10 DIAGNOSIS — D122 Benign neoplasm of ascending colon: Secondary | ICD-10-CM | POA: Diagnosis not present

## 2021-03-10 DIAGNOSIS — Z1211 Encounter for screening for malignant neoplasm of colon: Secondary | ICD-10-CM | POA: Diagnosis not present

## 2021-03-10 DIAGNOSIS — F331 Major depressive disorder, recurrent, moderate: Secondary | ICD-10-CM

## 2021-03-10 DIAGNOSIS — G4700 Insomnia, unspecified: Secondary | ICD-10-CM

## 2021-03-10 DIAGNOSIS — Z8371 Family history of colonic polyps: Secondary | ICD-10-CM | POA: Diagnosis not present

## 2021-03-10 DIAGNOSIS — D123 Benign neoplasm of transverse colon: Secondary | ICD-10-CM | POA: Diagnosis not present

## 2021-03-10 LAB — HM COLONOSCOPY

## 2021-03-11 MED ORDER — ESZOPICLONE 2 MG PO TABS
ORAL_TABLET | ORAL | 0 refills | Status: DC
  Filled 2021-03-11: qty 90, 90d supply, fill #0

## 2021-03-11 MED ORDER — AMPHETAMINE-DEXTROAMPHET ER 10 MG PO CP24
ORAL_CAPSULE | Freq: Every day | ORAL | 0 refills | Status: DC
  Filled 2021-03-11: qty 90, 90d supply, fill #0

## 2021-03-11 NOTE — Telephone Encounter (Signed)
Last filled 10/7 appt on 3/27

## 2021-03-12 ENCOUNTER — Other Ambulatory Visit (HOSPITAL_COMMUNITY): Payer: Self-pay

## 2021-03-17 ENCOUNTER — Ambulatory Visit: Payer: 59 | Admitting: Physical Therapy

## 2021-03-17 ENCOUNTER — Encounter: Payer: Self-pay | Admitting: Physical Therapy

## 2021-03-17 DIAGNOSIS — G8929 Other chronic pain: Secondary | ICD-10-CM

## 2021-03-17 DIAGNOSIS — M25512 Pain in left shoulder: Secondary | ICD-10-CM

## 2021-03-17 NOTE — Therapy (Signed)
[facility] [address] [place], [place], [address] Phone: [phone]   Fax:  [phone]  Physical Therapy Treatment  Patient Details  Name: Amanda Alvarez MRN: [id number] Date of Birth: 29-Jul-1965 Referring Provider (PT): Alyssa Allwardt   Encounter Date: 03/17/2021   PT End of Session - 03/17/21 1206     Visit Number 6    Number of Visits 12    Date for PT Re-Evaluation 04/01/21    Authorization Type [facility] /  Re-eval at visit 5.    PT Start Time 1105    PT Stop Time 1144    PT Time Calculation (min) 39 min    Activity Tolerance Patient tolerated treatment well    Behavior During Therapy WFL for tasks assessed/performed             Past Medical History:  Diagnosis Date   Cough 08/26/2014   GAD (generalized anxiety disorder)    Major depressive disorder, single episode, severe, without mention of psychotic behavior    OCD (obsessive compulsive disorder)    Urinary tract infection, site not specified    chronic since childhood. Has had multiple eval: cysto x 3 all with chronic inflammation with lymphocytis invasion with plasma cells. for suppresion    Past Surgical History:  Procedure Laterality Date   CYSTOSCOPY     with uretheral stretches    There were no vitals filed for this visit.   Subjective Assessment - 03/17/21 1206     Subjective Pt states continued soreness in shoulder with end range motions. Has been stretching and doing HEP, and feels she has not made much improvement.    Currently in Pain? Yes    Pain Score 5     Pain Location Shoulder    Pain Orientation Left    Pain Type Acute pain    Pain Onset More than a month ago    Pain Frequency Intermittent                [facility] PT Assessment - 03/17/21 0001       AROM   Left Shoulder Flexion 165 Degrees    Left Shoulder ABduction 160 Degrees    Left Shoulder Internal Rotation 55 Degrees    Left Shoulder External Rotation 71 Degrees                            [facility] Adult PT Treatment/Exercise - 03/17/21 0001       Exercises   Exercises Shoulder      Shoulder Exercises: Supine   Flexion Limitations cane, review for HEP      Shoulder Exercises: Standing   External Rotation 15 reps    Theraband Level (Shoulder External Rotation) Level 3 (Green)    Internal Rotation 15 reps    Theraband Level (Shoulder Internal Rotation) Level 3 (Green)    Row 20 reps    Theraband Level (Shoulder Row) Level 3 (Green)    Other Standing Exercises Flexion AROM 2 lb x 10;      Shoulder Exercises: Stretch   Corner Stretch 3 reps;30 seconds    Corner Stretch Limitations doorway, mild pain, better with arms higher at 90 deg    Other Shoulder Stretches Shoulder IR, extension, across body, 2 hand hold x10;   posterior shoulder stretch 20 sec x 3;  ER butterfly supine x 10;   ER at table/ 90 deg 20 sec x 5  Manual Therapy  ° Manual Therapy Joint mobilization;Passive ROM   ° Joint Mobilization Post and Inf GHJ mobs, LAD,   ° Passive ROM PRom for all motions L shoulde, focus on IR/ER   ° °  °  ° °  ° ° ° ° ° ° ° ° ° ° PT Education - 03/17/21 1207   ° ° Education Details reviewed HEP   ° Person(s) Educated Patient   ° Methods Demonstration;Explanation;Tactile cues;Verbal cues;Handout   ° Comprehension Verbalized understanding;Returned demonstration;Verbal cues required;Tactile cues required;Need further instruction   ° °  °  ° °  ° ° ° PT Short Term Goals - 02/04/21 2132   ° °  ° PT SHORT TERM GOAL #1  ° Title Pt to be independent with initial HEP   ° Time 2   ° Period Weeks   ° Status Achieved   ° °  °  ° °  ° ° ° ° PT Long Term Goals - 02/04/21 2132   ° °  ° PT LONG TERM GOAL #1  ° Title Pt to be independent wtih final HEP   ° Time 6   ° Period Weeks   ° Status Partially Met   ° Target Date 04/01/21   °  ° PT LONG TERM GOAL #2  ° Title Pt to demo full ROM for L shoulder without pain, to improve ability for ADLs and work duties.   °  Time 6   ° Period Weeks   ° Status On-going   ° Target Date 04/01/21   °  ° PT LONG TERM GOAL #3  ° Title Pt to demo improved strength of L shoulder to 5/5 to improve ability for lift, carry, exercise, and IADLs.   ° Time 6   ° Period Weeks   ° Status Partially Met   ° Target Date 04/01/21   °  ° PT LONG TERM GOAL #4  ° Title Pto demo ability for lifting at least 10 lb overhead and across body, to improve ability for IADLs. and exercise.   ° Time 6   ° Period Weeks   ° Status On-going   ° Target Date 12/16/20   °  ° PT LONG TERM GOAL #5  ° Title 04/01/2021   ° °  °  ° °  ° ° ° ° ° ° ° ° Plan - 03/17/21 1207   ° ° Clinical Impression Statement Pt with noted improvements in ROM today. Improvements are small, but improved from last visit, for flexion, abd, and ER.  ER and IR still most painful at end range. IR behind back moving further, but still quite uncomfortable. Reviewed HEP and best exercises to continue focus on restoring ROM. Discussed continuing the plan of care at this time, for optimal results. Pt somewhat frustrated with time frame and ongoing stiffness/soreness in shoulder.   ° Personal Factors and Comorbidities Time since onset of injury/illness/exacerbation   ° Examination-Activity Limitations Carry;Lift;Reach Overhead   ° Examination-Participation Restrictions Cleaning;Community Activity;Yard Work;Meal Prep;Occupation   ° Stability/Clinical Decision Making Stable/Uncomplicated   ° Rehab Potential Good   ° PT Frequency 2x / week   ° PT Duration 6 weeks   ° PT Treatment/Interventions ADLs/Self Care Home Management;Cryotherapy;Electrical Stimulation;Iontophoresis 4mg/ml Dexamethasone;Moist Heat;Traction;Ultrasound;Neuromuscular re-education;Patient/family education;Manual techniques;Passive range of motion;Dry needling;Taping;Vasopneumatic Device;Spinal Manipulations;Joint Manipulations;DME Instruction;Functional mobility training;Therapeutic activities;Therapeutic exercise   ° PT Home Exercise Plan  [id number]   ° Consulted and Agree with Plan of Care Patient   ° °  °  ° °  ° ° °  Patient will benefit from skilled therapeutic intervention in order to improve the following deficits and impairments:  Decreased activity tolerance, Pain, Decreased strength, Decreased mobility, Increased muscle spasms, Decreased range of motion  Visit Diagnosis: Chronic left shoulder pain     Problem List Patient Active Problem List   Diagnosis Date Noted   Osteopenia 08/30/2020   GERD (gastroesophageal reflux disease) 09/28/2017   Hyperlipidemia 03/05/2017   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

## 2021-03-20 ENCOUNTER — Encounter: Payer: Self-pay | Admitting: Adult Health

## 2021-03-20 ENCOUNTER — Other Ambulatory Visit: Payer: Self-pay

## 2021-03-20 ENCOUNTER — Ambulatory Visit (INDEPENDENT_AMBULATORY_CARE_PROVIDER_SITE_OTHER): Payer: 59 | Admitting: Adult Health

## 2021-03-20 ENCOUNTER — Other Ambulatory Visit (HOSPITAL_COMMUNITY): Payer: Self-pay

## 2021-03-20 DIAGNOSIS — F331 Major depressive disorder, recurrent, moderate: Secondary | ICD-10-CM

## 2021-03-20 DIAGNOSIS — G4700 Insomnia, unspecified: Secondary | ICD-10-CM | POA: Diagnosis not present

## 2021-03-20 DIAGNOSIS — D509 Iron deficiency anemia, unspecified: Secondary | ICD-10-CM | POA: Insufficient documentation

## 2021-03-20 MED ORDER — ESCITALOPRAM OXALATE 10 MG PO TABS
10.0000 mg | ORAL_TABLET | Freq: Every day | ORAL | 3 refills | Status: DC
  Filled 2021-03-20 – 2021-03-28 (×2): qty 90, 90d supply, fill #0
  Filled 2022-01-26: qty 90, 90d supply, fill #1

## 2021-03-20 NOTE — Progress Notes (Signed)
Amanda Alvarez [id number] May 07, 1965 56 y.o.  Subjective:   [name] is a 56 y.o. (DOB 1965/11/23) female.  Chief Complaint: No chief complaint on file.   HPI KENITA BINES presents to the office today for follow-up of MDD and insomnia.  Describes mood today as "ok". Pleasant. Denies tearfulness. Mood symptoms - denies depression, anxiety, and irritability. Stating "I'm doing good". Has been tapering off the Lunesta. Wanting to return to sleeping without an aide. Would also like to discontinue the Wellbutrin because it may be too activating. Would like to restart the Lexapro. Enjoyed holidays with family. Stable interest and motivation. Taking medications as prescribed.  Energy levels stable. Active, does not have aregular exercise routine.  Enjoys some usual interests and activities. In a relationship. Lives with son age 72 years old. Daughter a Administrator, arts at [employer]. Spending time with family. Appetite adequate. Weight stable - 120 pounds. Sleeps well most nights. Averages 6 hours. Focus and concentration stable. Completing tasks. Managing aspects of household. Work going well. Denies SI or HI.  Denies AH or VH.  Previous medication trials: Provigil   PHQ2-9    Flowsheet Row Video Visit from 07/29/2020 in [facility] Office Visit from 03/14/2020 in [facility] Video Visit from 11/03/2019 in [facility] Video Visit from 06/02/2019 in [facility] Visit from 11/22/2018 in [facility]  PHQ-2 Total Score 0 0 6 2 0  PHQ-9 Total Score 0 -- 12 8 3         Review of Systems:  Review of Systems  Musculoskeletal:  Negative for gait problem.  Neurological:  Negative for tremors.  Psychiatric/Behavioral:         Please refer to HPI   Medications: I have reviewed the patient's current medications.  Current Outpatient  Medications  Medication Sig Dispense Refill   amphetamine-dextroamphetamine (ADDERALL XR) 10 MG 24 hr capsule TAKE 1 CAPSULE BY MOUTH ONCE A DAY 90 capsule 0   buPROPion (WELLBUTRIN XL) 150 MG 24 hr tablet TAKE 1 TABLET BY MOUTH ONCE A DAY 90 tablet 3   cholecalciferol (VITAMIN D3) 25 MCG (1000 UNIT) tablet Take 1,000 Units by mouth daily.     escitalopram (LEXAPRO) 10 MG tablet Take 1 tablet by mouth daily. 90 tablet 3   estradiol-norethindrone (ACTIVELLA) 1-0.5 MG tablet Take 1 tablet by mouth daily.     estradiol-norethindrone (ACTIVELLA) 1-0.5 MG tablet Take 1 tablet by mouth daily 90 tablet 3   eszopiclone (LUNESTA) 2 MG TABS tablet TAKE 1 TABLET BY MOUTH AT BEDTIME AS NEEDED FOR SLEEP. TAKE IMMEDIATELY BEFORE BREAKFAST 90 tablet 0   LINZESS 145 MCG CAPS capsule TAKE 1 CAPSULE (145 MCG TOTAL) BY MOUTH DAILY. (Patient taking differently: Take 145 mcg by mouth as needed.) 30 capsule 5   nitrofurantoin, macrocrystal-monohydrate, (MACROBID) 100 MG capsule Take 1 capsule (100 mg total) by mouth daily. 90 capsule 3   No current facility-administered medications for this visit.    Medication Side Effects: None  Allergies:  Allergies  Allergen Reactions   Ambien [Zolpidem Tartrate]     Patient states this is NOT an allergy she just doesn't like taking the medication     Past Medical History:  Diagnosis Date   Cough 08/26/2014   GAD (generalized anxiety disorder)    Major depressive disorder, single episode, severe, without mention of psychotic behavior    OCD (obsessive compulsive disorder)    Urinary tract  infection, site not specified    chronic since childhood. Has had multiple eval: cysto x 3 all with chronic inflammation with lymphocytis invasion with plasma cells. for suppresion    Past Medical History, Surgical history, Social history, and Family history were reviewed and updated as appropriate.   Please see review of systems for further details on the patient's review from  today.   Objective:   Physical Exam:  There were no vitals taken for this visit.  Physical Exam Constitutional:      General: She is not in acute distress. Musculoskeletal:        General: No deformity.  Neurological:     Mental Status: She is alert and oriented to person, place, and time.     Coordination: Coordination normal.  Psychiatric:        Attention and Perception: Attention and perception normal. She does not perceive auditory or visual hallucinations.        Mood and Affect: Mood normal. Mood is not anxious or depressed. Affect is not labile, blunt, angry or inappropriate.        Speech: Speech normal.        Behavior: Behavior normal.        Thought Content: Thought content normal. Thought content is not paranoid or delusional. Thought content does not include homicidal or suicidal ideation. Thought content does not include homicidal or suicidal plan.        Cognition and Memory: Cognition and memory normal.        Judgment: Judgment normal.     Comments: Insight intact    Lab Review:     Component Value Date/Time   NA 143 11/22/2018 1553   K 4.3 11/22/2018 1553   CL 105 11/22/2018 1553   CO2 27 11/22/2018 1553   GLUCOSE 93 11/22/2018 1553   BUN 9 11/22/2018 1553   CREATININE 0.65 11/22/2018 1553   CALCIUM 9.7 11/22/2018 1553   PROT 6.8 11/22/2018 1553   ALBUMIN 4.4 11/22/2018 1553   AST 12 11/22/2018 1553   ALT 8 11/22/2018 1553   ALKPHOS 61 11/22/2018 1553   BILITOT 0.5 11/22/2018 1553   GFRNONAA >60 08/27/2014 2240   GFRAA >60 08/27/2014 2240       Component Value Date/Time   WBC 5.1 11/22/2018 1553   RBC 4.27 11/22/2018 1553   HGB 13.4 11/22/2018 1553   HCT 39.9 11/22/2018 1553   PLT 301.0 11/22/2018 1553   MCV 93.5 11/22/2018 1553   MCH 28.8 08/27/2014 2240   MCHC 33.5 11/22/2018 1553   RDW 12.8 11/22/2018 1553   LYMPHSABS 2.0 08/13/2015 1027   MONOABS 0.4 08/13/2015 1027   EOSABS 0.1 08/13/2015 1027   BASOSABS 0.0 08/13/2015 1027    No  results found for: POCLITH, LITHIUM   No results found for: PHENYTOIN, PHENOBARB, VALPROATE, CBMZ   .res Assessment: Plan:    Plan:  PDMP reviewed  Adderall XR 10mg  capsule - taking  2 to 3 times a week Add Lexapro 10mg  daily  BP WNL per patient  RTC 6 months  Patient advised to contact office with any questions, adverse effects, or acute worsening in signs and symptoms.  Discussed potential benefits, risks, and side effects of stimulants with patient to include increased heart rate, palpitations, insomnia, increased anxiety, increased irritability, or decreased appetite.  Instructed patient to contact office if experiencing any significant tolerability issues.  Diagnoses and all orders for this visit:  Major depressive disorder, recurrent episode, moderate (HCC)  Insomnia, unspecified  type -     escitalopram (LEXAPRO) 10 MG tablet; Take 1 tablet by mouth daily.     Please see After Visit Summary for patient specific instructions.  Future Appointments  Date Time Provider [place]  05/12/2021  8:40 AM Khaidyn Staebell, Berdie Ogren, NP CP-CP None    No orders of the defined types were placed in this encounter.   -------------------------------

## 2021-03-28 ENCOUNTER — Other Ambulatory Visit (HOSPITAL_COMMUNITY): Payer: Self-pay

## 2021-03-29 ENCOUNTER — Other Ambulatory Visit (HOSPITAL_COMMUNITY): Payer: Self-pay

## 2021-05-12 ENCOUNTER — Ambulatory Visit: Payer: 59 | Admitting: Adult Health

## 2021-06-04 ENCOUNTER — Other Ambulatory Visit (HOSPITAL_COMMUNITY): Payer: Self-pay

## 2021-06-04 ENCOUNTER — Other Ambulatory Visit: Payer: Self-pay | Admitting: Adult Health

## 2021-06-04 DIAGNOSIS — F331 Major depressive disorder, recurrent, moderate: Secondary | ICD-10-CM

## 2021-06-05 ENCOUNTER — Other Ambulatory Visit (HOSPITAL_COMMUNITY): Payer: Self-pay

## 2021-06-10 ENCOUNTER — Other Ambulatory Visit (HOSPITAL_COMMUNITY): Payer: Self-pay

## 2021-06-10 MED ORDER — AMPHETAMINE-DEXTROAMPHET ER 10 MG PO CP24
ORAL_CAPSULE | Freq: Every day | ORAL | 0 refills | Status: DC
Start: 2021-06-10 — End: 2021-09-03
  Filled 2021-06-10: qty 90, 90d supply, fill #0

## 2021-06-30 ENCOUNTER — Ambulatory Visit: Payer: 59 | Admitting: Family Medicine

## 2021-06-30 ENCOUNTER — Other Ambulatory Visit (HOSPITAL_COMMUNITY): Payer: Self-pay

## 2021-06-30 ENCOUNTER — Encounter: Payer: Self-pay | Admitting: Family Medicine

## 2021-06-30 VITALS — BP 120/80 | HR 84 | Temp 98.1°F | Ht 65.0 in | Wt 129.4 lb

## 2021-06-30 DIAGNOSIS — G43009 Migraine without aura, not intractable, without status migrainosus: Secondary | ICD-10-CM | POA: Diagnosis not present

## 2021-06-30 MED ORDER — SUMATRIPTAN SUCCINATE 25 MG PO TABS
25.0000 mg | ORAL_TABLET | ORAL | 11 refills | Status: DC | PRN
Start: 2021-06-30 — End: 2022-03-10
  Filled 2021-06-30: qty 10, 30d supply, fill #0

## 2021-06-30 NOTE — Patient Instructions (Addendum)
Sign release of information at the check out desk for Mammogram.  ? ?Lets try the imitrex again- we can go up to 50 mg if needed if not effective ? ?Try to limit to 2 days a week.  ? ?Update me in 1 month with your headache journal ? ?Recommended follow up: Return in about 6 months (around 12/31/2021) for physical or sooner if needed.Schedule b4 you leave.  ?-consider labs next visit or if you can drop off the labs when you finally get them ?

## 2021-06-30 NOTE — Progress Notes (Signed)
?Phone [phone] ?In person visit ?  ?Subjective:  ? ?Amanda Alvarez is a 56 y.o. year old very pleasant female patient who presents for/with See problem oriented charting ?Chief Complaint  ?Patient presents with  ? Headache  ?  Pt c/o headaches that she has had for years and they are getting worse. She has only taking otc tylenol and motrin.   ? ? ?Past Medical History-  ?Patient Active Problem List  ? Diagnosis Date Noted  ? Depression 12/13/2007  ?  Priority: Medium   ? Chronic UTI 08/22/2007  ?  Priority: Medium   ? Osteopenia 08/30/2020  ?  Priority: Low  ? 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  Priority: Low  ? Constipation 08/13/2015  ?  Priority: Low  ? Iron deficiency anemia 03/20/2021  ? GERD (gastroesophageal reflux disease) 09/28/2017  ? Hyperlipidemia 03/05/2017  ? Anemia 08/13/2015  ? ? ?Medications- reviewed and updated ?Current Outpatient Medications  ?Medication Sig Dispense Refill  ? amphetamine-dextroamphetamine (ADDERALL XR) 10 MG 24 hr capsule TAKE 1 CAPSULE BY MOUTH ONCE A DAY 90 capsule 0  ? cholecalciferol (VITAMIN D3) 25 MCG (1000 UNIT) tablet Take 1,000 Units by mouth daily.    ? escitalopram (LEXAPRO) 10 MG tablet Take 1 tablet by mouth daily. 90 tablet 3  ? estradiol-norethindrone (ACTIVELLA) 1-0.5 MG tablet Take 1 tablet by mouth daily 90 tablet 3  ? nitrofurantoin, macrocrystal-monohydrate, (MACROBID) 100 MG capsule Take 1 capsule (100 mg total) by mouth daily. 90 capsule 3  ? SUMAtriptan (IMITREX) 25 MG tablet Take 1 tablet  by mouth as needed for migraine. May repeat in 2 hours if headache persists (max of 2 tablets per 24 hours) 10 tablet 11  ? ?No current facility-administered medications for this visit.  ? ?  ?Objective:  ?BP 120/80   Pulse 84   Temp 98.1 ?F (36.7 ?C)   Ht '5\' 5"'$  (1.651 m)   Wt 129 lb 6.4 oz (58.7 kg)   SpO2 97%   BMI 21.53 kg/m?  ?Gen: NAD, resting comfortably ?Neuro: CN II-XII intact, sensation and reflexes normal throughout, 5/5 muscle strength in  bilateral upper and lower extremities. Normal finger to nose. Normal rapid alternating movements. No pronator drift. Normal romberg. Normal gait.  ? ?  ? ?Assessment and Plan  ? ?#Headaches ?S:  ?In chart review we discussed headache behind left eye 09/28/2017-was also having maxillary pain and we wondered if this could be related to sinusitis-treated with Augmentin at that time.  She reached out on 10/06/2017 complaining of waking with a terrible headache each morning and we did CT of her sinuses in october-which only showed minimal mucosal thickening of right maxillary sinus-did have a periapical lucency along root canal upper right posterior second molar but no breakthrough into the sinuses..  Later in 2019 she wondered if Trintellix could be playing a role in her headaches- switching didn't make a difference.  Tried of her birth control for a month  ? ?We later thought this could be migraines and we started her on Imitrex which seemed to be helpful as of notes on 11/22/2018 ? ?Patient complains of headaches for several years.  They seem to be worsening.  He is taken over-the-counter Tylenol and Motrin and reports some improvement  ?Marland Kitchen  ?Waking up 3-4x a week with headaches behind left eye. Stopped aspartame. Tried humidifier. Tried nasal spray. Tried new pillows. No allergies but tried claritin. Wake her up from sleep. Does not get them in the daytime.  Burning sensation in left nasal cavity. Radiates to to back left side of head. Never happens on the right. Tylenol may or may not help. Same with motrin- can get through day at least. Declines aura. Light sensitivity. More of a constant pressure- not a throbbing sensation. No dental pain. Imitrex was helpful in past for similar. Not worse on adderall XR. When wakes up only 3/10 before ramping up to 7/10.  ? ?Had MRI brain when she had her kids in 2005 (before age 66)- very similar pattern of headaches at that time. ?A/P: Left-sided headaches appear to be migraines  (worsening pattern but out of imitrex) given improvement with sumatriptan in the past.  She does not want daily medication.  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 Stress could contribute to headache pattern with how hard she works but I do not think that is parimary cause.  ?  ?Recommended follow up: Return in about 6 months (around 12/31/2021) for physical or sooner if needed.Schedule b4 you leave. ? ?Lab/Order associations: ?  ICD-10-CM   ?1. Migraine without aura and without status migrainosus, not intractable  G43.009   ?  ? ? ?Meds ordered this encounter  ?Medications  ? SUMAtriptan (IMITREX) 25 MG tablet  ?  Sig: Take 1 tablet  by mouth as needed for migraine. May repeat in 2 hours if headache persists (max of 2 tablets per 24 hours)  ?  Dispense:  10 tablet  ?  Refill:  11  ? ? ?Return precautions advised.  ?Garret Reddish, MD ? ?

## 2021-07-07 ENCOUNTER — Other Ambulatory Visit (HOSPITAL_COMMUNITY): Payer: Self-pay

## 2021-07-22 ENCOUNTER — Other Ambulatory Visit: Payer: Self-pay | Admitting: Family Medicine

## 2021-07-22 ENCOUNTER — Ambulatory Visit: Payer: 59 | Admitting: Family Medicine

## 2021-07-22 ENCOUNTER — Other Ambulatory Visit (HOSPITAL_COMMUNITY): Payer: Self-pay

## 2021-07-22 ENCOUNTER — Encounter: Payer: Self-pay | Admitting: Family Medicine

## 2021-07-22 VITALS — BP 112/80 | HR 86 | Temp 98.6°F | Ht 65.0 in | Wt 127.2 lb

## 2021-07-22 DIAGNOSIS — J069 Acute upper respiratory infection, unspecified: Secondary | ICD-10-CM | POA: Diagnosis not present

## 2021-07-22 MED ORDER — LIDOCAINE VISCOUS HCL 2 % MT SOLN
15.0000 mL | OROMUCOSAL | 0 refills | Status: DC | PRN
  Filled 2021-07-22: qty 100, 6d supply, fill #0

## 2021-07-22 MED ORDER — AMOXICILLIN-POT CLAVULANATE 875-125 MG PO TABS
1.0000 | ORAL_TABLET | Freq: Two times a day (BID) | ORAL | 0 refills | Status: DC
  Filled 2021-07-22: qty 20, 10d supply, fill #0

## 2021-07-22 MED ORDER — LIDOCAINE VISCOUS HCL 2 % MT SOLN
15.0000 mL | OROMUCOSAL | 0 refills | Status: DC | PRN
  Filled 2021-07-22: qty 100, fill #0

## 2021-07-22 NOTE — Progress Notes (Signed)
Subjective:     Patient ID: Amanda Alvarez, female    DOB: 1965-08-05, 56 y.o.   MRN: [id number]  Chief Complaint  Patient presents with   Cough   Nasal Congestion   Sore Throat    Sx started 1 week ago Has been taking Advil and Dayquil     HPI Chief complaint: cough/congestion Symptom onset: 1 wk Pertinent positives: cough-productive green/thick, congestion, sore throat-still and hurts to swallow.  Not getting better, but not worse. Pertinent negatives: no f/c. No sob. Treatments tried: OTC Vaccine status: had Sick exposure: no family members.   There are no preventive care reminders to display for this patient.   Past Medical History:  Diagnosis Date   Cough 08/26/2014   GAD (generalized anxiety disorder)    Major depressive disorder, single episode, severe, without mention of psychotic behavior    OCD (obsessive compulsive disorder)    Urinary tract infection, site not specified    chronic since childhood. Has had multiple eval: cysto x 3 all with chronic inflammation with lymphocytis invasion with plasma cells. for suppresion    Past Surgical History:  Procedure Laterality Date   CYSTOSCOPY     with uretheral stretches    Outpatient Medications Prior to Visit  Medication Sig Dispense Refill   amphetamine-dextroamphetamine (ADDERALL XR) 10 MG 24 hr capsule TAKE 1 CAPSULE BY MOUTH ONCE A DAY 90 capsule 0   cholecalciferol (VITAMIN D3) 25 MCG (1000 UNIT) tablet Take 1,000 Units by mouth daily.     escitalopram (LEXAPRO) 10 MG tablet Take 1 tablet by mouth daily. 90 tablet 3   estradiol-norethindrone (ACTIVELLA) 1-0.5 MG tablet Take 1 tablet by mouth daily 90 tablet 3   nitrofurantoin, macrocrystal-monohydrate, (MACROBID) 100 MG capsule Take 1 capsule (100 mg total) by mouth daily. 90 capsule 3   phenazopyridine (PYRIDIUM) 200 MG tablet phenazopyridine 200 mg tablet     SUMAtriptan (IMITREX) 25 MG tablet Take 1 tablet  by mouth as needed for migraine. May repeat  in 2 hours if headache persists (max of 2 tablets per 24 hours) 10 tablet 11   No facility-administered medications prior to visit.    Allergies  Allergen Reactions   Ambien [Zolpidem Tartrate]     Patient states this is NOT an allergy she just doesn't like taking the medication    ROS neg/noncontributory except as noted HPI/below      Objective:     BP 112/80   Pulse 86   Temp 98.6 F (37 C) (Temporal)   Ht '5\' 5"'$  (1.651 m)   Wt 127 lb 4 oz (57.7 kg)   SpO2 97%   BMI 21.18 kg/m  Wt Readings from Last 3 Encounters:  07/22/21 127 lb 4 oz (57.7 kg)  06/30/21 129 lb 6.4 oz (58.7 kg)  08/30/20 116 lb 3.2 oz (52.7 kg)    Physical Exam   Gen: WDWN NAD HEENT: NCAT, conjunctiva not injected, sclera nonicteric TM WNL B, OP moist, no exudates  but red and ulcer on R pillar NECK:  supple, no thyromegaly, no nodes, no carotid bruits CARDIAC: RRR, S1S2+, no murmur.  LUNGS: CTAB. No wheezes EXT:  no edema MSK: no gross abnormalities.  NEURO: A&O x3.  CN II-XII intact.  PSYCH: normal mood. Good eye contact     Assessment & Plan:   Problem List Items Addressed This Visit   None Visit Diagnoses     Upper respiratory tract infection, unspecified type    -  Primary  URI-prob started as viral but not improving.  Augmentin 875 bid.  Also w/ulcer R op-?canker, viral, other.  L lysine, viscus lidocaine.    Meds ordered this encounter  Medications   amoxicillin-clavulanate (AUGMENTIN) 875-125 MG tablet    Sig: Take 1 tablet by mouth 2 (two) times daily.    Dispense:  20 tablet    Refill:  0   lidocaine (XYLOCAINE) 2 % solution    Sig: Use as directed 15 mLs in the mouth or throat as needed for mouth pain.    Dispense:  100 mL    Refill:  0    Wellington Hampshire, MD

## 2021-07-22 NOTE — Patient Instructions (Signed)
Meds have been sent the the pharmacy You can take tylenol/ibuprofen for pain/fevers If worsening symptoms, let us know or go to the Emergency room

## 2021-08-08 ENCOUNTER — Ambulatory Visit: Payer: 59 | Admitting: Family

## 2021-08-08 ENCOUNTER — Encounter: Payer: Self-pay | Admitting: Family

## 2021-08-08 ENCOUNTER — Other Ambulatory Visit (HOSPITAL_COMMUNITY): Payer: Self-pay

## 2021-08-08 VITALS — BP 128/77 | HR 75 | Temp 98.4°F | Resp 16 | Ht 64.5 in | Wt 127.4 lb

## 2021-08-08 DIAGNOSIS — J029 Acute pharyngitis, unspecified: Secondary | ICD-10-CM

## 2021-08-08 MED ORDER — LIDOCAINE VISCOUS HCL 2 % MT SOLN
15.0000 mL | OROMUCOSAL | 0 refills | Status: DC | PRN
  Filled 2021-08-08: qty 100, 1d supply, fill #0

## 2021-08-08 MED ORDER — PANTOPRAZOLE SODIUM 20 MG PO TBEC
20.0000 mg | DELAYED_RELEASE_TABLET | Freq: Every day | ORAL | 0 refills | Status: DC
  Filled 2021-08-08: qty 30, 30d supply, fill #0

## 2021-08-12 ENCOUNTER — Other Ambulatory Visit (HOSPITAL_COMMUNITY): Payer: Self-pay

## 2021-09-03 ENCOUNTER — Other Ambulatory Visit (HOSPITAL_COMMUNITY): Payer: Self-pay

## 2021-09-03 ENCOUNTER — Other Ambulatory Visit: Payer: Self-pay | Admitting: Adult Health

## 2021-09-03 DIAGNOSIS — F331 Major depressive disorder, recurrent, moderate: Secondary | ICD-10-CM

## 2021-09-05 ENCOUNTER — Other Ambulatory Visit (HOSPITAL_COMMUNITY): Payer: Self-pay

## 2021-09-05 MED ORDER — AMPHETAMINE-DEXTROAMPHET ER 10 MG PO CP24
10.0000 mg | ORAL_CAPSULE | Freq: Every day | ORAL | 0 refills | Status: DC
  Filled 2021-09-08: qty 90, 90d supply, fill #0

## 2021-09-08 ENCOUNTER — Other Ambulatory Visit (HOSPITAL_COMMUNITY): Payer: Self-pay

## 2021-09-29 ENCOUNTER — Encounter: Payer: Self-pay | Admitting: Adult Health

## 2021-09-29 ENCOUNTER — Ambulatory Visit: Payer: 59 | Admitting: Adult Health

## 2021-09-29 ENCOUNTER — Other Ambulatory Visit (HOSPITAL_COMMUNITY): Payer: Self-pay

## 2021-09-29 DIAGNOSIS — F331 Major depressive disorder, recurrent, moderate: Secondary | ICD-10-CM

## 2021-09-29 MED ORDER — AMPHETAMINE-DEXTROAMPHET ER 10 MG PO CP24
10.0000 mg | ORAL_CAPSULE | Freq: Every day | ORAL | 0 refills | Status: DC
  Filled 2021-09-29 – 2021-12-15 (×2): qty 90, 90d supply, fill #0

## 2021-09-29 MED ORDER — BUPROPION HCL ER (XL) 150 MG PO TB24
150.0000 mg | ORAL_TABLET | Freq: Every day | ORAL | 3 refills | Status: DC
  Filled 2021-09-29: qty 90, 90d supply, fill #0

## 2021-09-29 NOTE — Progress Notes (Signed)
Amanda Alvarez [id number] [birth year] 56 y.o.  Subjective:   [name] is a 56 y.o. (DOB 05-Mar-1965) female.  Chief Complaint: No chief complaint on file.   HPI Amanda Alvarez presents to the office today for follow-up of MDD and insomnia.  Describes mood today as "ok". Pleasant. Denies tearfulness. Mood symptoms - denies depression, anxiety, and irritability. Stating "I'm doing well". Would like to restart the Wellbutrin and taper off the Lexapro. Family doing well Stable interest and motivation. Taking medications as prescribed.  Energy levels stable. Active, has a regular exercise routine.  Enjoys some usual interests and activities. In a relationship. Lives with son age 80 years old. Daughter taking a gap year in [place]. Spending time with family. Appetite adequate. Weight stable - 120 pounds. Sleeps well most nights. Averages 6 hours. Focus and concentration stable. Completing tasks. Managing aspects of household. Work going well. Denies SI or HI.  Denies AH or VH.  Previous medication trials: Provigil    PHQ2-9    [place] Office Visit from 07/22/2021 in [facility] Video Visit from 07/29/2020 in [facility] Office Visit from 03/14/2020 in [facility] Video Visit from 11/03/2019 in [facility] Video Visit from 06/02/2019 in [facility]  PHQ-2 Total Score 0 0 0 6 2  PHQ-9 Total Score 0 0 -- 12 8        Review of Systems:  Review of Systems  Musculoskeletal:  Negative for gait problem.  Neurological:  Negative for tremors.  Psychiatric/Behavioral:         Please refer to HPI    Medications: I have reviewed the patient's current medications.  Current Outpatient Medications  Medication Sig Dispense Refill   buPROPion (WELLBUTRIN XL) 150 MG 24 hr tablet Take 1 tablet (150 mg total) by mouth daily. 90 tablet  3   amphetamine-dextroamphetamine (ADDERALL XR) 10 MG 24 hr capsule Take 1 capsule by mouth daily. 90 capsule 0   cholecalciferol (VITAMIN D3) 25 MCG (1000 UNIT) tablet Take 1,000 Units by mouth daily.     escitalopram (LEXAPRO) 10 MG tablet Take 1 tablet by mouth daily. 90 tablet 3   estradiol-norethindrone (ACTIVELLA) 1-0.5 MG tablet Take 1 tablet by mouth daily 90 tablet 3   lidocaine (XYLOCAINE) 2 % solution Use as directed 15 mLs in the mouth or throat every 4 (four) hours as needed for mouth pain. Gargle and spit. 100 mL 0   pantoprazole (PROTONIX) 20 MG tablet Take 1 tablet by mouth twice a day for 2 weeks, then 1 tab every morning. 30 tablet 0   phenazopyridine (PYRIDIUM) 200 MG tablet phenazopyridine 200 mg tablet     SUMAtriptan (IMITREX) 25 MG tablet Take 1 tablet  by mouth as needed for migraine. May repeat in 2 hours if headache persists (max of 2 tablets per 24 hours) 10 tablet 11   No current facility-administered medications for this visit.    Medication Side Effects: None  Allergies:  Allergies  Allergen Reactions   Ambien [Zolpidem Tartrate]     Patient states this is NOT an allergy she just doesn't like taking the medication     Past Medical History:  Diagnosis Date   Cough 08/26/2014   GAD (generalized anxiety disorder)    Major depressive disorder, single episode, severe, without mention of psychotic behavior    OCD (obsessive compulsive disorder)    Urinary tract infection, site not specified  chronic since childhood. Has had multiple eval: cysto x 3 all with chronic inflammation with lymphocytis invasion with plasma cells. for suppresion    Past Medical History, Surgical history, Social history, and Family history were reviewed and updated as appropriate.   Please see review of systems for further details on the patient's review from today.   Objective:   Physical Exam:  There were no vitals taken for this visit.  Physical Exam Constitutional:       General: She is not in acute distress. Musculoskeletal:        General: No deformity.  Neurological:     Mental Status: She is alert and oriented to person, place, and time.     Coordination: Coordination normal.  Psychiatric:        Attention and Perception: Attention and perception normal. She does not perceive auditory or visual hallucinations.        Mood and Affect: Mood normal. Mood is not anxious or depressed. Affect is not labile, blunt, angry or inappropriate.        Speech: Speech normal.        Behavior: Behavior normal.        Thought Content: Thought content normal. Thought content is not paranoid or delusional. Thought content does not include homicidal or suicidal ideation. Thought content does not include homicidal or suicidal plan.        Cognition and Memory: Cognition and memory normal.        Judgment: Judgment normal.     Comments: Insight intact     Lab Review:     Component Value Date/Time   NA 143 11/22/2018 1553   K 4.3 11/22/2018 1553   CL 105 11/22/2018 1553   CO2 27 11/22/2018 1553   GLUCOSE 93 11/22/2018 1553   BUN 9 11/22/2018 1553   CREATININE 0.65 11/22/2018 1553   CALCIUM 9.7 11/22/2018 1553   PROT 6.8 11/22/2018 1553   ALBUMIN 4.4 11/22/2018 1553   AST 12 11/22/2018 1553   ALT 8 11/22/2018 1553   ALKPHOS 61 11/22/2018 1553   BILITOT 0.5 11/22/2018 1553   GFRNONAA >60 08/27/2014 2240   GFRAA >60 08/27/2014 2240       Component Value Date/Time   WBC 5.1 11/22/2018 1553   RBC 4.27 11/22/2018 1553   HGB 13.4 11/22/2018 1553   HCT 39.9 11/22/2018 1553   PLT 301.0 11/22/2018 1553   MCV 93.5 11/22/2018 1553   MCH 28.8 08/27/2014 2240   MCHC 33.5 11/22/2018 1553   RDW 12.8 11/22/2018 1553   LYMPHSABS 2.0 08/13/2015 1027   MONOABS 0.4 08/13/2015 1027   EOSABS 0.1 08/13/2015 1027   BASOSABS 0.0 08/13/2015 1027    No results found for: "POCLITH", "LITHIUM"   No results found for: "PHENYTOIN", "PHENOBARB", "VALPROATE", "CBMZ"    .res Assessment: Plan:    Plan:  PDMP reviewed  Adderall XR '10mg'$  capsule - taking  2 to 3 times a week D/C Lexapro '10mg'$  daily Add Wellbutrin XL '150mg'$  daily  BP WNL per patient  RTC 6 months  Patient advised to contact office with any questions, adverse effects, or acute worsening in signs and symptoms.  Discussed potential benefits, risks, and side effects of stimulants with patient to include increased heart rate, palpitations, insomnia, increased anxiety, increased irritability, or decreased appetite.  Instructed patient to contact office if experiencing any significant tolerability issues.  Diagnoses and all orders for this visit:  Major depressive disorder, recurrent episode, moderate ([place]) -  amphetamine-dextroamphetamine (ADDERALL XR) 10 MG 24 hr capsule; Take 1 capsule by mouth daily. -     buPROPion (WELLBUTRIN XL) 150 MG 24 hr tablet; Take 1 tablet (150 mg total) by mouth daily.     Please see After Visit Summary for patient specific instructions.  No future appointments.  No orders of the defined types were placed in this encounter.   -------------------------------

## 2021-10-13 ENCOUNTER — Telehealth: Payer: Self-pay | Admitting: Adult Health

## 2021-10-13 NOTE — Telephone Encounter (Signed)
Patient states she is not sleeping well since switching to the Wellbutrin XL a couple of weeks ago. She takes medication about 7 AM, but it is 2:00-3:00 before she can get to sleep. She is not taking the Adderall currently. She said this is probably a result of stopping the Celexa. She is asking if there is a different formulation of Wellbutrin that she can take. She had no other c/o on it. I told her she probably hasn't been on it long enough to get the full benefit. She said if you thought she would level out she would continue it, but she is asking for a RF of Lunesta so she can sleep.

## 2021-10-13 NOTE — Telephone Encounter (Signed)
Pt called requesting to change Generic Wellbutrin from XL to  regular release. Causing issues with sleep. Stated wants to stay on Wellbutrin just not XL. [place].  Pt # [phone]

## 2021-10-13 NOTE — Telephone Encounter (Signed)
Ok to [place]. We could always try the Wellbutrin SR formula as well.

## 2021-10-14 ENCOUNTER — Other Ambulatory Visit: Payer: Self-pay

## 2021-10-14 ENCOUNTER — Other Ambulatory Visit (HOSPITAL_COMMUNITY): Payer: Self-pay

## 2021-10-14 MED ORDER — ESZOPICLONE 2 MG PO TABS
2.0000 mg | ORAL_TABLET | Freq: Every evening | ORAL | 0 refills | Status: DC | PRN
  Filled 2021-10-14: qty 30, 30d supply, fill #0

## 2021-10-14 MED ORDER — BUPROPION HCL ER (SR) 150 MG PO TB12
150.0000 mg | ORAL_TABLET | Freq: Two times a day (BID) | ORAL | 0 refills | Status: DC
Start: 2021-10-14 — End: 2021-12-16
  Filled 2021-10-14: qty 60, 30d supply, fill #0

## 2021-10-14 NOTE — Telephone Encounter (Signed)
Pended.

## 2021-10-15 ENCOUNTER — Telehealth: Payer: Self-pay

## 2021-10-15 NOTE — Telephone Encounter (Signed)
Called and spoke with patient.

## 2021-11-10 ENCOUNTER — Encounter: Payer: Self-pay | Admitting: *Deleted

## 2021-11-25 ENCOUNTER — Other Ambulatory Visit (HOSPITAL_COMMUNITY): Payer: Self-pay

## 2021-11-25 MED ORDER — ESTRADIOL-NORETHINDRONE ACET 1-0.5 MG PO TABS
1.0000 | ORAL_TABLET | Freq: Every day | ORAL | 0 refills | Status: DC
  Filled 2021-11-25: qty 84, 84d supply, fill #0

## 2021-11-26 ENCOUNTER — Other Ambulatory Visit (HOSPITAL_COMMUNITY): Payer: Self-pay

## 2021-12-02 ENCOUNTER — Other Ambulatory Visit (HOSPITAL_COMMUNITY): Payer: Self-pay

## 2021-12-02 ENCOUNTER — Other Ambulatory Visit (HOSPITAL_BASED_OUTPATIENT_CLINIC_OR_DEPARTMENT_OTHER): Payer: Self-pay

## 2021-12-02 MED ORDER — INFLUENZA VAC SPLIT QUAD 0.5 ML IM SUSY
PREFILLED_SYRINGE | INTRAMUSCULAR | 0 refills | Status: DC
  Filled 2021-12-02: qty 0.5, 1d supply, fill #0

## 2021-12-15 ENCOUNTER — Other Ambulatory Visit: Payer: Self-pay | Admitting: Adult Health

## 2021-12-15 ENCOUNTER — Other Ambulatory Visit (HOSPITAL_COMMUNITY): Payer: Self-pay

## 2021-12-15 DIAGNOSIS — F331 Major depressive disorder, recurrent, moderate: Secondary | ICD-10-CM

## 2021-12-16 ENCOUNTER — Other Ambulatory Visit: Payer: Self-pay

## 2021-12-16 ENCOUNTER — Telehealth: Payer: Self-pay | Admitting: Adult Health

## 2021-12-16 ENCOUNTER — Other Ambulatory Visit (HOSPITAL_COMMUNITY): Payer: Self-pay

## 2021-12-16 MED ORDER — BUPROPION HCL ER (SR) 150 MG PO TB12
150.0000 mg | ORAL_TABLET | Freq: Every day | ORAL | 1 refills | Status: DC
  Filled 2021-12-16: qty 30, 30d supply, fill #0

## 2021-12-16 MED ORDER — BUPROPION HCL ER (SR) 150 MG PO TB12
150.0000 mg | ORAL_TABLET | Freq: Two times a day (BID) | ORAL | 1 refills | Status: DC
  Filled 2021-12-16: qty 60, 30d supply, fill #0
  Filled 2022-03-10: qty 60, 30d supply, fill #1

## 2021-12-17 NOTE — Telephone Encounter (Signed)
error 

## 2021-12-31 ENCOUNTER — Other Ambulatory Visit (HOSPITAL_COMMUNITY): Payer: Self-pay

## 2022-01-26 ENCOUNTER — Other Ambulatory Visit (HOSPITAL_COMMUNITY): Payer: Self-pay

## 2022-01-29 ENCOUNTER — Encounter: Payer: Self-pay | Admitting: *Deleted

## 2022-02-11 ENCOUNTER — Telehealth: Payer: 59 | Admitting: Physician Assistant

## 2022-02-11 ENCOUNTER — Other Ambulatory Visit (HOSPITAL_COMMUNITY): Payer: Self-pay

## 2022-02-11 DIAGNOSIS — U071 COVID-19: Secondary | ICD-10-CM

## 2022-02-11 MED ORDER — NIRMATRELVIR/RITONAVIR (PAXLOVID)TABLET
3.0000 | ORAL_TABLET | Freq: Two times a day (BID) | ORAL | 0 refills | Status: AC
  Filled 2022-02-11: qty 30, 5d supply, fill #0

## 2022-02-11 MED ORDER — BENZONATATE 100 MG PO CAPS
100.0000 mg | ORAL_CAPSULE | Freq: Three times a day (TID) | ORAL | 0 refills | Status: DC | PRN
  Filled 2022-02-11: qty 30, 10d supply, fill #0

## 2022-02-11 MED ORDER — ONDANSETRON HCL 4 MG PO TABS
4.0000 mg | ORAL_TABLET | Freq: Three times a day (TID) | ORAL | 0 refills | Status: DC | PRN
  Filled 2022-02-11: qty 20, 7d supply, fill #0

## 2022-02-11 MED ORDER — FLUTICASONE PROPIONATE 50 MCG/ACT NA SUSP
2.0000 | Freq: Every day | NASAL | 0 refills | Status: DC
  Filled 2022-02-11: qty 16, 30d supply, fill #0

## 2022-02-11 NOTE — Patient Instructions (Signed)
Amanda Alvarez, thank you for joining Mar Daring, PA-C for today's virtual visit.  While this provider is not your primary care provider (PCP), if your PCP is located in our provider database this encounter information will be shared with them immediately following your visit.   [facility] account gives you access to today's visit and all your visits, tests, and labs performed at [facility] " click here if you don't have a [facility] account or go to [email or web address]  Consent: (Patient) Amanda Alvarez provided verbal consent for this virtual visit at the beginning of the encounter.  Current Medications:  Current Outpatient Medications:    benzonatate (TESSALON) 100 MG capsule, Take 1 capsule (100 mg total) by mouth 3 (three) times daily as needed., Disp: 30 capsule, Rfl: 0   nirmatrelvir/ritonavir (PAXLOVID) 20 x 150 MG & 10 x '100MG'$  TABS, Take 3 tablets by mouth 2 (two) times daily for 5 days. (Take nirmatrelvir 150 mg two tablets twice daily for 5 days and ritonavir 100 mg one tablet twice daily for 5 days) Patient GFR is 95, Disp: 30 tablet, Rfl: 0   ondansetron (ZOFRAN) 4 MG tablet, Take 1 tablet (4 mg total) by mouth every 8 (eight) hours as needed for nausea or vomiting., Disp: 20 tablet, Rfl: 0   amphetamine-dextroamphetamine (ADDERALL XR) 10 MG 24 hr capsule, Take 1 capsule (10 mg total) by mouth daily., Disp: 90 capsule, Rfl: 0   buPROPion (WELLBUTRIN SR) 150 MG 12 hr tablet, Take 1 tablet (150 mg total) by mouth 2 (two) times daily., Disp: 60 tablet, Rfl: 1   cholecalciferol (VITAMIN D3) 25 MCG (1000 UNIT) tablet, Take 1,000 Units by mouth daily., Disp: , Rfl:    escitalopram (LEXAPRO) 10 MG tablet, Take 1 tablet by mouth daily., Disp: 90 tablet, Rfl: 3   estradiol-norethindrone (ACTIVELLA) 1-0.5 MG tablet, Take 1 tablet by mouth daily, Disp: 90 tablet, Rfl: 3   estradiol-norethindrone (ACTIVELLA) 1-0.5 MG tablet, Take 1 tablet by  mouth daily., Disp: 84 tablet, Rfl: 0   eszopiclone (LUNESTA) 2 MG TABS tablet, Take 1 tablet (2 mg total) by mouth at bedtime as needed for sleep. Take immediately before bedtime, Disp: 30 tablet, Rfl: 0   fluticasone (FLONASE) 50 MCG/ACT nasal spray, Place 2 sprays into both nostrils daily., Disp: 16 g, Rfl: 0   influenza vac split quadrivalent PF (FLUARIX) 0.5 ML injection, Inject into the muscle., Disp: 0.5 mL, Rfl: 0   lidocaine (XYLOCAINE) 2 % solution, Use as directed 15 mLs in the mouth or throat every 4 (four) hours as needed for mouth pain. Gargle and spit., Disp: 100 mL, Rfl: 0   pantoprazole (PROTONIX) 20 MG tablet, Take 1 tablet by mouth twice a day for 2 weeks, then 1 tab every morning., Disp: 30 tablet, Rfl: 0   phenazopyridine (PYRIDIUM) 200 MG tablet, phenazopyridine 200 mg tablet, Disp: , Rfl:    SUMAtriptan (IMITREX) 25 MG tablet, Take 1 tablet  by mouth as needed for migraine. May repeat in 2 hours if headache persists (max of 2 tablets per 24 hours), Disp: 10 tablet, Rfl: 11   Medications ordered in this encounter:  Meds ordered this encounter  Medications   nirmatrelvir/ritonavir (PAXLOVID) 20 x 150 MG & 10 x '100MG'$  TABS    Sig: Take 3 tablets by mouth 2 (two) times daily for 5 days. (Take nirmatrelvir 150 mg two tablets twice daily for 5 days and ritonavir 100 mg one tablet twice daily for  5 days) Patient GFR is 95    Dispense:  30 tablet    Refill:  0    Order Specific Question:   Supervising Provider    Answer:   LAMPTEY, PHILIP O [[id number]]   ondansetron (ZOFRAN) 4 MG tablet    Sig: Take 1 tablet (4 mg total) by mouth every 8 (eight) hours as needed for nausea or vomiting.    Dispense:  20 tablet    Refill:  0    Order Specific Question:   Supervising Provider    Answer:   Chase Picket [[id number]]   benzonatate (TESSALON) 100 MG capsule    Sig: Take 1 capsule (100 mg total) by mouth 3 (three) times daily as needed.    Dispense:  30 capsule    Refill:  0    Order  Specific Question:   Supervising Provider    Answer:   Chase Picket [[id number]]   fluticasone (FLONASE) 50 MCG/ACT nasal spray    Sig: Place 2 sprays into both nostrils daily.    Dispense:  16 g    Refill:  0    Order Specific Question:   Supervising Provider    Answer:   Chase Picket [id number]     *If you need refills on other medications prior to your next appointment, please contact your pharmacy*  Follow-Up: Call back or seek an in-person evaluation if the symptoms worsen or if the condition fails to improve as anticipated.  [place] [phone]  Other Instructions  COVID-19 COVID-19, or coronavirus disease 2019, is an infection that is caused by a new (novel) coronavirus called SARS-CoV-2. COVID-19 can cause many symptoms. In some people, the virus may not cause any symptoms. In others, it may cause mild or severe symptoms. Some people with severe infection develop severe disease. What are the causes? This illness is caused by a virus. The virus may be in the air as tiny specks of fluid (aerosols) or droplets, or it may be on surfaces. You may catch the virus by: Breathing in droplets from an infected person. Droplets can be spread by a person breathing, speaking, singing, coughing, or sneezing. Touching something, like a table or a doorknob, that has virus on it (is contaminated) and then touching your mouth, nose, or eyes. What increases the risk? Risk for infection: You are more likely to get infected with the COVID-19 virus if: You are within 6 ft (1.8 m) of a person with COVID-19 for 15 minutes or longer. You are providing care for a person who is infected with COVID-19. You are in close personal contact with other people. Close personal contact includes hugging, kissing, or sharing eating or drinking utensils. Risk for serious illness caused by COVID-19: You are more likely to get seriously ill from the COVID-19 virus if: You have cancer. You  have a long-term (chronic) disease, such as: Chronic lung disease. This includes pulmonary embolism, chronic obstructive pulmonary disease, and cystic fibrosis. Long-term disease that lowers your body's ability to fight infection (immunocompromise). Serious cardiac conditions, such as heart failure, coronary artery disease, or cardiomyopathy. Diabetes. Chronic kidney disease. Liver diseases. These include cirrhosis, nonalcoholic fatty liver disease, alcoholic liver disease, or autoimmune hepatitis. You have obesity. You are pregnant or were recently pregnant. You have sickle cell disease. What are the signs or symptoms? Symptoms of this condition can range from mild to severe. Symptoms may appear any time from 2 to 14 days after being exposed to the  virus. They include: Fever or chills. Shortness of breath or trouble breathing. Feeling tired or very tired. Headaches, body aches, or muscle aches. Runny or stuffy nose, sneezing, coughing, or sore throat. New loss of taste or smell. This is rare. Some people may also have stomach problems, such as nausea, vomiting, or diarrhea. Other people may not have any symptoms of COVID-19. How is this diagnosed? This condition may be diagnosed by testing samples to check for the COVID-19 virus. The most common tests are the PCR test and the antigen test. Tests may be done in the lab or at home. They include: Using a swab to take a sample of fluid from the back of your nose and throat (nasopharyngeal fluid), from your nose, or from your throat. Testing a sample of saliva from your mouth. Testing a sample of coughed-up mucus from your lungs (sputum). How is this treated? Treatment for COVID-19 infection depends on the severity of the condition. Mild symptoms can be managed at home with rest, fluids, and over-the-counter medicines. Serious symptoms may be treated in a hospital intensive care unit (ICU). Treatment in the ICU may include: Supplemental  oxygen. Extra oxygen is given through a tube in the nose, a face mask, or a hood. Medicines. These may include: Antivirals, such as monoclonal antibodies. These help your body fight off certain viruses that can cause disease. Anti-inflammatories, such as corticosteroids. These reduce inflammation and suppress the immune system. Antithrombotics. These prevent or treat blood clots, if they develop. Convalescent plasma. This helps boost your immune system, if you have an underlying immunosuppressive condition or are getting immunosuppressive treatments. Prone positioning. This means you will lie on your stomach. This helps oxygen to get into your lungs. Infection control measures. If you are at risk for more serious illness caused by COVID-19, your health care provider may prescribe two long-acting monoclonal antibodies, given together every 6 months. How is this prevented? To protect yourself: Use preventive medicine (pre-exposure prophylaxis). You may get pre-exposure prophylaxis if you have moderate or severe immunocompromise. Get vaccinated. Anyone 35 months old or older who meets guidelines can get a COVID-19 vaccine or vaccine series. This includes people who are pregnant or making breast milk (lactating). Get an added dose of COVID-19 vaccine after your first vaccine or vaccine series if you have moderate to severe immunocompromise. This applies if you have had a solid organ transplant or have been diagnosed with an immunocompromising condition. You should get the added dose 4 weeks after you got the first COVID-19 vaccine or vaccine series. If you get an mRNA vaccine, you will need a 3-dose primary series. If you get the J&J/Janssen vaccine, you will need a 2-dose primary series, with the second dose being an mRNA vaccine. Talk to your health care provider about getting experimental monoclonal antibodies. This treatment is approved under emergency use authorization to prevent severe illness  before or after being exposed to the COVID-19 virus. You may be given monoclonal antibodies if: You have moderate or severe immunocompromise. This includes treatments that lower your immune response. People with immunocompromise may not develop protection against COVID-19 when they are vaccinated. You cannot be vaccinated. You may not get a vaccine if you have a severe allergic reaction to the vaccine or its components. You are not fully vaccinated. You are in a facility where COVID-19 is present and: Are in close contact with a person who is infected with the COVID-19 virus. Are at high risk of being exposed to the COVID-19 virus.  You are at risk of illness from new variants of the COVID-19 virus. To protect others: If you have symptoms of COVID-19, take steps to prevent the virus from spreading to others. Stay home. Leave your house only to get medical care. Do not use public transit, if possible. Do not travel while you are sick. Wash your hands often with soap and water for at least 20 seconds. If soap and water are not available, use alcohol-based hand sanitizer. Make sure that all people in your household wash their hands well and often. Cough or sneeze into a tissue or your sleeve or elbow. Do not cough or sneeze into your hand or into the air. Where to find more information Centers for Disease Control and Prevention: CharmCourses.be World Health Organization: https://www.castaneda.info/ Get help right away if: You have trouble breathing. You have pain or pressure in your chest. You are confused. You have bluish lips and fingernails. You have trouble waking from sleep. You have symptoms that get worse. These symptoms may be an emergency. Get help right away. Call 911. Do not wait to see if the symptoms will go away. Do not drive yourself to the hospital. Summary COVID-19 is an infection that is caused by a new coronavirus. Sometimes, there are no symptoms. Other  times, symptoms range from mild to severe. Some people with a severe COVID-19 infection develop severe disease. The virus that causes COVID-19 can spread from person to person through droplets or aerosols from breathing, speaking, singing, coughing, or sneezing. Mild symptoms of COVID-19 can be managed at home with rest, fluids, and over-the-counter medicines. This information is not intended to replace advice given to you by your health care provider. Make sure you discuss any questions you have with your health care provider. Document Revised: 01/21/2021 Document Reviewed: 01/23/2021 Elsevier Patient Education  [place].    If you have been instructed to have an in-person evaluation today at a local Urgent Care facility, please use the link below. It will take you to a list of all of our available [facility], including address, phone number and hours of operation. Please do not delay care.  [facility]  If you or a family member do not have a primary care provider, use the link below to schedule a visit and establish care. When you choose a [facility] primary care physician or advanced practice provider, you gain a long-term partner in health. Find a Primary Care Provider  Learn more about 's in-office and virtual care options: [facility]

## 2022-02-11 NOTE — Progress Notes (Signed)
Virtual Visit Consent   Amanda Alvarez, you are scheduled for a virtual visit with a [facility] provider today. Just as with appointments in the office, your consent must be obtained to participate. Your consent will be active for this visit and any virtual visit you may have with one of our providers in the next 365 days. If you have a MyChart account, a copy of this consent can be sent to you electronically.  As this is a virtual visit, video technology does not allow for your provider to perform a traditional examination. This may limit your provider's ability to fully assess your condition. If your provider identifies any concerns that need to be evaluated in person or the need to arrange testing (such as labs, EKG, etc.), we will make arrangements to do so. Although advances in technology are sophisticated, we cannot ensure that it will always work on either your end or our end. If the connection with a video visit is poor, the visit may have to be switched to a telephone visit. With either a video or telephone visit, we are not always able to ensure that we have a secure connection.  By engaging in this virtual visit, you consent to the provision of healthcare and authorize for your insurance to be billed (if applicable) for the services provided during this visit. Depending on your insurance coverage, you may receive a charge related to this service.  I need to obtain your verbal consent now. Are you willing to proceed with your visit today? Amanda Alvarez has provided verbal consent on 02/11/2022 for a virtual visit (video or telephone). Mar Daring, PA-C  Date: 02/11/2022 12:59 PM  Virtual Visit via Video Note   I, Mar Daring, connected with  Amanda Alvarez  ([id number], 09-21-1965) on 02/11/22 at 12:30 PM EST by a video-enabled telemedicine application and verified that I am speaking with the correct person using two identifiers.  Location: Patient: Virtual Visit  Location Patient: Home Provider: Virtual Visit Location Provider: Home Office   I discussed the limitations of evaluation and management by telemedicine and the availability of in person appointments. The patient expressed understanding and agreed to proceed.    History of Present Illness: Amanda Alvarez is a 56 y.o. who identifies as a female who was assigned female at birth, and is being seen today for Covid 24.  HPI: URI  This is a new problem. Episode onset: Tested positive for Covid 19 on at home test this morning, symptoms started yesterday evening with a headache. The problem has been gradually worsening. Maximum temperature: subjective fevers. The fever has been present for Less than 1 day. Associated symptoms include congestion, coughing, headaches, nausea, rhinorrhea (post nasal drainage), sinus pain (pressure) and a sore throat (mild). Pertinent negatives include no diarrhea, ear pain, plugged ear sensation or vomiting. Associated symptoms comments: Chills, myalgias. She has tried acetaminophen, increased fluids, NSAIDs and sleep for the symptoms. The treatment provided no relief.     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 (Take nirmatrelvir 150 mg two tablets twice daily for 5 days and ritonavir 100 mg one tablet twice daily for 5 days) Patient GFR is 95, Disp: 30 tablet, Rfl: 0   ondansetron (ZOFRAN) 4 MG tablet, Take 1 tablet (4 mg total) by mouth every 8 (eight) hours as needed for nausea or vomiting., Disp: 20 tablet, Rfl: 0   amphetamine-dextroamphetamine (ADDERALL XR) 10 MG 24 hr capsule, Take 1 capsule (10 mg total) by mouth daily., Disp: 90 capsule, Rfl: 0   buPROPion (WELLBUTRIN SR) 150 MG 12 hr tablet, Take 1 tablet (150 mg total) by mouth 2 (two) times daily., Disp: 60 tablet, Rfl: 1   cholecalciferol (VITAMIN D3) 25 MCG (1000 UNIT) tablet, Take 1,000 Units by mouth daily., Disp: , Rfl:    escitalopram (LEXAPRO) 10 MG tablet, Take 1 tablet by mouth daily., Disp: 90 tablet, Rfl: 3   estradiol-norethindrone (ACTIVELLA) 1-0.5 MG tablet, Take 1 tablet by mouth daily, Disp: 90 tablet, Rfl: 3   estradiol-norethindrone (ACTIVELLA) 1-0.5 MG tablet, Take 1 tablet by mouth daily., Disp: 84 tablet, Rfl: 0   eszopiclone (LUNESTA) 2 MG TABS tablet, Take 1 tablet (2 mg total) by mouth at bedtime as needed for sleep. Take immediately before bedtime, Disp: 30 tablet, Rfl: 0   fluticasone (FLONASE) 50 MCG/ACT nasal spray, Place 2 sprays into both nostrils daily., Disp: 16 g, Rfl: 0   influenza vac split quadrivalent PF (FLUARIX) 0.5 ML injection, Inject into the muscle., Disp: 0.5 mL, Rfl: 0   lidocaine (XYLOCAINE) 2 % solution, Use as directed 15 mLs in the mouth or throat every 4 (four) hours as needed for mouth pain. Gargle and spit., Disp: 100 mL, Rfl: 0   pantoprazole (PROTONIX) 20 MG tablet, Take 1 tablet by mouth twice a day for 2 weeks, then 1 tab every morning., Disp: 30 tablet, Rfl: 0   phenazopyridine (PYRIDIUM) 200 MG tablet, phenazopyridine 200 mg tablet, Disp: , Rfl:    SUMAtriptan (IMITREX) 25 MG tablet, Take 1 tablet  by mouth as needed for migraine. May repeat in 2 hours if headache persists (max of 2  tablets per 24 hours), Disp: 10 tablet, Rfl: 11  Observations/Objective: Patient is well-developed, well-nourished in no acute distress.  Resting comfortably at home.  Head is normocephalic, atraumatic.  No labored breathing.  Speech is clear and coherent with logical content.  Patient is alert and oriented at baseline.    Assessment and Plan: 1. COVID-19 - MyChart COVID-19 home monitoring program; Future - nirmatrelvir/ritonavir (PAXLOVID) 20 x 150 MG & 10 x '100MG'$  TABS; Take 3 tablets by mouth 2 (two) times daily for 5 days. (Take nirmatrelvir 150 mg two tablets twice daily for 5 days and ritonavir 100 mg one tablet twice daily for 5 days) Patient GFR is 95  Dispense: 30 tablet; Refill: 0 - ondansetron (ZOFRAN) 4 MG tablet; Take 1 tablet (4 mg total) by mouth every 8 (eight) hours as needed for nausea or vomiting.  Dispense: 20 tablet; Refill: 0 - benzonatate (TESSALON) 100 MG capsule; Take 1 capsule (100 mg total) by mouth 3 (three) times daily as needed.  Dispense:  30 capsule; Refill: 0 - fluticasone (FLONASE) 50 MCG/ACT nasal spray; Place 2 sprays into both nostrils daily.  Dispense: 16 g; Refill: 0  - Continue OTC symptomatic management of choice - Will send OTC vitamins and supplement information through AVS - Paxlovid prescribed; patient advised of most common ADR and possible kidney injury and agreed to proceed - Added Flonase, Zofran, and tessalon perles for symptomatic management as needed - Patient enrolled in MyChart symptom monitoring - Push fluids - Rest as needed - Discussed return precautions and when to seek in-person evaluation, sent via AVS as well   Follow Up Instructions: I discussed the assessment and treatment plan with the patient. The patient was provided an opportunity to ask questions and all were answered. The patient agreed with the plan and demonstrated an understanding of the instructions.  A copy of instructions were sent to the patient via MyChart unless  otherwise noted below.    The patient was advised to call back or seek an in-person evaluation if the symptoms worsen or if the condition fails to improve as anticipated.  Time:  I spent 18 minutes with the patient via telehealth technology discussing the above problems/concerns.    Mar Daring, PA-C

## 2022-03-09 ENCOUNTER — Other Ambulatory Visit (HOSPITAL_COMMUNITY): Payer: Self-pay

## 2022-03-09 DIAGNOSIS — Z6820 Body mass index (BMI) 20.0-20.9, adult: Secondary | ICD-10-CM | POA: Diagnosis not present

## 2022-03-09 DIAGNOSIS — Z01419 Encounter for gynecological examination (general) (routine) without abnormal findings: Secondary | ICD-10-CM | POA: Diagnosis not present

## 2022-03-09 DIAGNOSIS — Z124 Encounter for screening for malignant neoplasm of cervix: Secondary | ICD-10-CM | POA: Diagnosis not present

## 2022-03-09 DIAGNOSIS — N952 Postmenopausal atrophic vaginitis: Secondary | ICD-10-CM | POA: Diagnosis not present

## 2022-03-09 DIAGNOSIS — N959 Unspecified menopausal and perimenopausal disorder: Secondary | ICD-10-CM | POA: Diagnosis not present

## 2022-03-09 DIAGNOSIS — N760 Acute vaginitis: Secondary | ICD-10-CM | POA: Diagnosis not present

## 2022-03-09 MED ORDER — METRONIDAZOLE 0.75 % VA GEL
VAGINAL | 0 refills | Status: DC
  Filled 2022-03-09: qty 70, 5d supply, fill #0

## 2022-03-09 MED ORDER — ESTRADIOL-NORETHINDRONE ACET 1-0.5 MG PO TABS
1.0000 | ORAL_TABLET | Freq: Every day | ORAL | 3 refills | Status: DC
  Filled 2022-03-09: qty 84, 84d supply, fill #0
  Filled 2022-12-23: qty 84, 84d supply, fill #1

## 2022-03-10 ENCOUNTER — Other Ambulatory Visit: Payer: Self-pay | Admitting: Adult Health

## 2022-03-10 ENCOUNTER — Other Ambulatory Visit (HOSPITAL_COMMUNITY): Payer: Self-pay

## 2022-03-10 ENCOUNTER — Ambulatory Visit (INDEPENDENT_AMBULATORY_CARE_PROVIDER_SITE_OTHER): Payer: Commercial Managed Care - PPO | Admitting: Family Medicine

## 2022-03-10 ENCOUNTER — Encounter: Payer: Self-pay | Admitting: Family Medicine

## 2022-03-10 VITALS — BP 115/74 | HR 75 | Temp 98.0°F | Ht 64.5 in | Wt 124.8 lb

## 2022-03-10 DIAGNOSIS — G43009 Migraine without aura, not intractable, without status migrainosus: Secondary | ICD-10-CM

## 2022-03-10 DIAGNOSIS — F331 Major depressive disorder, recurrent, moderate: Secondary | ICD-10-CM

## 2022-03-10 MED ORDER — SUMATRIPTAN SUCCINATE 50 MG PO TABS
50.0000 mg | ORAL_TABLET | Freq: Once | ORAL | 2 refills | Status: DC | PRN
  Filled 2022-03-10: qty 20, 34d supply, fill #0

## 2022-03-10 MED ORDER — PROPRANOLOL HCL ER 60 MG PO CP24
60.0000 mg | ORAL_CAPSULE | Freq: Every day | ORAL | 2 refills | Status: DC
  Filled 2022-03-10: qty 30, 30d supply, fill #0
  Filled 2022-04-14: qty 30, 30d supply, fill #1
  Filled 2022-05-26: qty 30, 30d supply, fill #2

## 2022-03-10 NOTE — Patient Instructions (Signed)
Please follow up as scheduled   If you have any questions or concerns, please don't hesitate to send me a message via MyChart or call the office at [phone]. Thank you for visiting with [place] today! It's our pleasure caring for you.

## 2022-03-10 NOTE — Progress Notes (Signed)
Subjective  CC:  Chief Complaint  Patient presents with   Headache    Pt here to go over migraines, and change meds for migraines     HPI: Amanda Alvarez is a 57 y.o. female who presents to the office today to address the problems listed above in the chief complaint. 57 year old female with typical migraines without aura.  I reviewed old notes.  Reviewed summary of headache history from PCPs medical record.  Has had long history of left-sided migrainous headaches associated with pain over left sinus shooting back behind left eye.  Will typically start early in the morning and often can abort headache with Advil or low-dose Imitrex.  Headaches are always the same.  No associated nausea, vomiting, photophobia, phonophobia or neurologic deficit.  She reports headaches 6-7 times per month.  3-4 those are difficult to abort with the Imitrex and they will last throughout her day.  Brain imaging has been normal in the distant past.  She has never been on preventative medications. I personally reviewed 2 office visits from her primary care discussing her migraine history, CT scan and MRI reports.  Assessment  1. Migraine without aura and without status migrainosus, not intractable      Plan  Uncontrolled migraines: Discussed frequency of headaches and recommendations for preventive medications.  Also recommend better abortive care.  Will increase Imitrex to 50 mg at onset of headache knowing that that could be titrated higher if needed.  Refilled today.  May also use Advil or Excedrin as needed.  Will start Inderal LA 60 mg daily for prevention.  Discussed risk benefits and possible side effects.  She has follow-up with her PCP at that time they can adjust medications from there.  There are multiple options for preventatives.  Encouraged her to work towards lessening frequency of headaches.  Patient understands and agrees with care plan.  Follow up: As scheduled with PCP 03/25/2022  No orders of  the defined types were placed in this encounter.  Meds ordered this encounter  Medications   propranolol ER (INDERAL LA) 60 MG 24 hr capsule    Sig: Take 1 capsule (60 mg total) by mouth daily.    Dispense:  30 capsule    Refill:  2   SUMAtriptan (IMITREX) 50 MG tablet    Sig: Take 1-2 tablets (50-100 mg total) by mouth once as needed for up to 1 dose for migraine. May repeat in 2 hours if headache persists or recurs.    Dispense:  20 tablet    Refill:  2      I reviewed the patients updated PMH, FH, and SocHx.    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   buPROPion (WELLBUTRIN SR) 150 MG 12 hr tablet Take 1 tablet (150 mg total) by mouth 2 (two) times daily.   cholecalciferol (VITAMIN D3) 25 MCG (1000 UNIT) tablet Take 1,000 Units by mouth daily.   estradiol-norethindrone (ACTIVELLA) 1-0.5 MG tablet Take 1 tablet by mouth daily   estradiol-norethindrone (ACTIVELLA) 1-0.5 MG tablet Take 1 tablet by mouth  daily.   estradiol-norethindrone (ACTIVELLA) 1-0.5 MG tablet Take 1 tablet by mouth daily.   eszopiclone (LUNESTA) 2 MG TABS tablet Take 1 tablet (2 mg total) by mouth at bedtime as needed for sleep. Take immediately before bedtime   metroNIDAZOLE (METROGEL) 0.75 % vaginal gel Insert 1 applicatorful into vagina at bedtime for 5 days.   nitrofurantoin, macrocrystal-monohydrate, (MACROBID) 100 MG capsule Take 1 capsule by mouth 2 (two) times daily.   ondansetron (ZOFRAN) 4 MG tablet Take 1 tablet (4 mg total) by mouth every 8 (eight) hours as needed for nausea  or vomiting.   phenazopyridine (PYRIDIUM) 200 MG tablet phenazopyridine 200 mg tablet   propranolol ER (INDERAL LA) 60 MG 24 hr capsule Take 1 capsule (60 mg total) by mouth daily.   SUMAtriptan (IMITREX) 50 MG tablet Take 1-2 tablets (50-100 mg total) by mouth once as needed for up to 1 dose for migraine. May repeat in 2 hours if headache persists or recurs.   [DISCONTINUED] SUMAtriptan (IMITREX) 25 MG tablet Take 1 tablet  by mouth as needed for migraine. May repeat in 2 hours if headache persists (max of 2 tablets per 24 hours)    Allergies: Patient is allergic to [employer] tartrate]. Family History: Patient family history includes Atrial fibrillation in her mother; Cancer in her paternal grandfather and another family member; Colon polyps in her father; Coronary artery disease in her father; Heart disease in some other family members; Hyperlipidemia in her father and mother; Hypertension in her brother; Hypothyroidism in her mother; Lung cancer in an other family member; Lymphoma in her father. Social History:  Patient  reports that she has never smoked. She has never used smokeless tobacco. She reports current alcohol use. She reports that she does not use drugs.  Review of Systems: Constitutional: Negative for fever malaise or anorexia Cardiovascular: negative for chest pain Respiratory: negative for SOB or persistent cough Gastrointestinal: negative for abdominal pain  Objective  Vitals: BP 115/74 (BP Location: Left Arm, Patient Position: Sitting)   Pulse 75   Temp 98 F (36.7 C) (Temporal)   Ht 5' 4.5" (1.638 m)   Wt 124 lb 12.8 oz (56.6 kg)   LMP 07/30/2015 (Approximate)   SpO2 98%   BMI 21.09 kg/m  General: no acute distress , A&Ox3   Commons side effects, risks, benefits, and alternatives for medications and treatment plan prescribed today were discussed, and the patient expressed understanding of the given instructions. Patient is instructed to call or message via  MyChart if he/she has any questions or concerns regarding our treatment plan. No barriers to understanding were identified. We discussed Red Flag symptoms and signs in detail. Patient expressed understanding regarding what to do in case of urgent or emergency type symptoms.  Medication list was reconciled, printed and provided to the patient in AVS. Patient instructions and summary information was reviewed with the patient as documented in the AVS. This note was prepared with assistance of Dragon voice recognition software. Occasional wrong-word or sound-a-like substitutions may have occurred due to the inherent limitations of voice recognition software

## 2022-03-11 ENCOUNTER — Other Ambulatory Visit (HOSPITAL_COMMUNITY): Payer: Self-pay

## 2022-03-11 MED ORDER — AZITHROMYCIN 500 MG PO TABS
500.0000 mg | ORAL_TABLET | Freq: Every day | ORAL | 0 refills | Status: DC
  Filled 2022-03-11: qty 3, 3d supply, fill #0

## 2022-03-11 MED ORDER — AMPHETAMINE-DEXTROAMPHET ER 10 MG PO CP24
10.0000 mg | ORAL_CAPSULE | Freq: Every day | ORAL | 0 refills | Status: DC
  Filled 2022-03-11 – 2022-03-16 (×3): qty 90, 90d supply, fill #0

## 2022-03-11 NOTE — Telephone Encounter (Signed)
Filled 12/16/21 f/u due next month

## 2022-03-16 ENCOUNTER — Other Ambulatory Visit: Payer: Self-pay

## 2022-03-16 ENCOUNTER — Other Ambulatory Visit (HOSPITAL_COMMUNITY): Payer: Self-pay

## 2022-03-25 ENCOUNTER — Encounter: Payer: 59 | Admitting: Family Medicine

## 2022-04-13 ENCOUNTER — Ambulatory Visit: Payer: Commercial Managed Care - PPO | Admitting: Adult Health

## 2022-04-13 ENCOUNTER — Encounter: Payer: Self-pay | Admitting: Adult Health

## 2022-04-13 ENCOUNTER — Other Ambulatory Visit (HOSPITAL_COMMUNITY): Payer: Self-pay

## 2022-04-13 DIAGNOSIS — F331 Major depressive disorder, recurrent, moderate: Secondary | ICD-10-CM

## 2022-04-13 MED ORDER — BUPROPION HCL ER (SR) 150 MG PO TB12
150.0000 mg | ORAL_TABLET | Freq: Two times a day (BID) | ORAL | 3 refills | Status: DC
  Filled 2022-04-13: qty 180, 90d supply, fill #0
  Filled 2022-09-29: qty 180, 90d supply, fill #1

## 2022-04-13 MED ORDER — AMPHETAMINE-DEXTROAMPHET ER 10 MG PO CP24
10.0000 mg | ORAL_CAPSULE | Freq: Every day | ORAL | 0 refills | Status: DC
  Filled 2022-04-13 – 2022-06-25 (×2): qty 90, 90d supply, fill #0

## 2022-04-13 NOTE — Progress Notes (Signed)
Amanda Alvarez [id number] 04/23/1965 57 y.o.  Subjective:   [name] is a 57 y.o. (DOB 02-12-1966) female.  Chief Complaint: No chief complaint on file.   HPI Amanda Alvarez presents to the office today for follow-up of MDD and insomnia.  Describes mood today as "ok". Pleasant. Denies tearfulness. Mood symptoms - denies depression, anxiety, and irritability. Mood is consistent. Stating "I'm doing good". Feels like current medication regimen works well. Family doing well. Stable interest and motivation. Taking medications as prescribed.  Energy levels stable. Active, has a regular exercise routine.  Enjoys some usual interests and activities. In a relationship. Has 2 children. Spending time with family. Appetite adequate. Weight stable - 120 pounds. Sleeps well most nights. Averages 6 hours. Focus and concentration stable. Completing tasks. Managing aspects of household. Work going well. Denies SI or HI.  Denies AH or VH.  Previous medication trials: Provigil   PHQ2-9    [facility] Office Visit from 03/10/2022 in [facility] Visit from 07/22/2021 in [facility] Video Visit from 07/29/2020 in [place] at [facility] Visit from 03/14/2020 in [place] Video Visit from 11/03/2019 in [place]  PHQ-2 Total Score 0 0 0 0 6  PHQ-9 Total Score 0 0 0 -- 12        Review of Systems:  Review of Systems  Musculoskeletal:  Negative for gait problem.  Neurological:  Negative for tremors.  Psychiatric/Behavioral:         Please refer to HPI    Medications: I have reviewed the patient's current medications.  Current Outpatient Medications  Medication Sig Dispense Refill   amphetamine-dextroamphetamine (ADDERALL XR) 10 MG 24 hr capsule Take 1 capsule (10 mg total) by mouth daily. 90 capsule 0   azithromycin (ZITHROMAX) 500  MG tablet Take 1 tablet (500 mg total) by mouth daily, start night before surgery. 3 tablet 0   buPROPion (WELLBUTRIN SR) 150 MG 12 hr tablet Take 1 tablet (150 mg total) by mouth 2 (two) times daily. 60 tablet 1   cholecalciferol (VITAMIN D3) 25 MCG (1000 UNIT) tablet Take 1,000 Units by mouth daily.     estradiol-norethindrone (ACTIVELLA) 1-0.5 MG tablet Take 1 tablet by mouth daily 90 tablet 3   estradiol-norethindrone (ACTIVELLA) 1-0.5 MG tablet Take 1 tablet by mouth daily. 84 tablet 0   estradiol-norethindrone (ACTIVELLA) 1-0.5 MG tablet Take 1 tablet by mouth daily. 90 tablet 3   eszopiclone (LUNESTA) 2 MG TABS tablet Take 1 tablet (2 mg total) by mouth at bedtime as needed for sleep. Take immediately before bedtime 30 tablet 0   metroNIDAZOLE (METROGEL) 0.75 % vaginal gel Insert 1 applicatorful into vagina at bedtime for 5 days. 70 g 0   nitrofurantoin, macrocrystal-monohydrate, (MACROBID) 100 MG capsule Take 1 capsule by mouth 2 (two) times daily.     ondansetron (ZOFRAN) 4 MG tablet Take 1 tablet (4 mg total) by mouth every 8 (eight) hours as needed for nausea or vomiting. 20 tablet 0   phenazopyridine (PYRIDIUM) 200 MG tablet phenazopyridine 200 mg tablet     propranolol ER (INDERAL LA) 60 MG 24 hr capsule Take 1 capsule (60 mg total) by mouth daily. 30 capsule 2   SUMAtriptan (IMITREX) 50 MG tablet Take 1-2 tablets (50-100 mg total) by mouth once as needed for up to 1 dose for migraine. May repeat in 2 hours if headache persists or recurs. [place]  tablet 2   No current facility-administered medications for this visit.    Medication Side Effects: None  Allergies:  Allergies  Allergen Reactions   Ambien [Zolpidem Tartrate]     Patient states this is NOT an allergy she just doesn't like taking the medication     Past Medical History:  Diagnosis Date   Cough 08/26/2014   GAD (generalized anxiety disorder)    Major depressive disorder, single episode, severe, without mention of  psychotic behavior    OCD (obsessive compulsive disorder)    Urinary tract infection, site not specified    chronic since childhood. Has had multiple eval: cysto x 3 all with chronic inflammation with lymphocytis invasion with plasma cells. for suppresion    Past Medical History, Surgical history, Social history, and Family history were reviewed and updated as appropriate.   Please see review of systems for further details on the patient's review from today.   Objective:   Physical Exam:  LMP 07/30/2015 (Approximate)   Physical Exam Constitutional:      General: She is not in acute distress. Musculoskeletal:        General: No deformity.  Neurological:     Mental Status: She is alert and oriented to person, place, and time.     Coordination: Coordination normal.  Psychiatric:        Attention and Perception: Attention and perception normal. She does not perceive auditory or visual hallucinations.        Mood and Affect: Mood normal. Mood is not anxious or depressed. Affect is not labile, blunt, angry or inappropriate.        Speech: Speech normal.        Behavior: Behavior normal.        Thought Content: Thought content normal. Thought content is not paranoid or delusional. Thought content does not include homicidal or suicidal ideation. Thought content does not include homicidal or suicidal plan.        Cognition and Memory: Cognition and memory normal.        Judgment: Judgment normal.     Comments: Insight intact     Lab Review:     Component Value Date/Time   NA 143 11/22/2018 1553   K 4.3 11/22/2018 1553   CL 105 11/22/2018 1553   CO2 27 11/22/2018 1553   GLUCOSE 93 11/22/2018 1553   BUN 9 11/22/2018 1553   CREATININE 0.65 11/22/2018 1553   CALCIUM 9.7 11/22/2018 1553   PROT 6.8 11/22/2018 1553   ALBUMIN 4.4 11/22/2018 1553   AST 12 11/22/2018 1553   ALT 8 11/22/2018 1553   ALKPHOS 61 11/22/2018 1553   BILITOT 0.5 11/22/2018 1553   GFRNONAA >60 08/27/2014 2240    GFRAA >60 08/27/2014 2240       Component Value Date/Time   WBC 5.1 11/22/2018 1553   RBC 4.27 11/22/2018 1553   HGB 13.4 11/22/2018 1553   HCT 39.9 11/22/2018 1553   PLT 301.0 11/22/2018 1553   MCV 93.5 11/22/2018 1553   MCH 28.8 08/27/2014 2240   MCHC 33.5 11/22/2018 1553   RDW 12.8 11/22/2018 1553   LYMPHSABS 2.0 08/13/2015 1027   MONOABS 0.4 08/13/2015 1027   EOSABS 0.1 08/13/2015 1027   BASOSABS 0.0 08/13/2015 1027    No results found for: "POCLITH", "LITHIUM"   No results found for: "PHENYTOIN", "PHENOBARB", "VALPROATE", "CBMZ"   .res Assessment: Plan:    Plan:  PDMP reviewed  Adderall XR '10mg'$  capsule - taking  2 to 3  times a week Wellbutrin SR '150mg'$  twice daily  BP WNL per patient  RTC 6 months  Patient advised to contact office with any questions, adverse effects, or acute worsening in signs and symptoms.  Discussed potential benefits, risks, and side effects of stimulants with patient to include increased heart rate, palpitations, insomnia, increased anxiety, increased irritability, or decreased appetite.  Instructed patient to contact office if experiencing any significant tolerability issues.  There are no diagnoses linked to this encounter.   Please see After Visit Summary for patient specific instructions.  Future Appointments  Date Time Provider [place]  04/13/2022  4:20 PM Janetta Vandoren, Berdie Ogren, NP [facility] None  07/02/2022  2:20 PM Marin Olp, MD [facility]    No orders of the defined types were placed in this encounter.   -------------------------------

## 2022-04-14 ENCOUNTER — Other Ambulatory Visit (HOSPITAL_COMMUNITY): Payer: Self-pay

## 2022-04-21 ENCOUNTER — Other Ambulatory Visit (HOSPITAL_COMMUNITY): Payer: Self-pay

## 2022-04-21 MED ORDER — TINIDAZOLE 500 MG PO TABS
ORAL_TABLET | ORAL | 0 refills | Status: DC
  Filled 2022-04-21 – 2022-04-28 (×2): qty 10, 5d supply, fill #0

## 2022-04-22 ENCOUNTER — Telehealth: Payer: Self-pay | Admitting: Family Medicine

## 2022-04-22 NOTE — Telephone Encounter (Signed)
.   Encourage patient to contact the pharmacy for refills or they can request refills through [place]: 06/30/21  NEXT APPOINTMENT DATE: 07/02/22  MEDICATION:  nitrofurantoin, macrocrystal-monohydrate, (MACROBID) 100 MG capsule   Is the patient out of medication?   PHARMACY:  [facility] Phone: [phone]  Fax: [phone]      Let patient know to contact pharmacy at the end of the day to make sure medication is ready.  Please notify patient to allow 48-72 hours to process

## 2022-04-23 MED ORDER — NITROFURANTOIN MONOHYD MACRO 100 MG PO CAPS
100.0000 mg | ORAL_CAPSULE | Freq: Every day | ORAL | 1 refills | Status: DC | PRN
  Filled 2022-04-23: qty 30, 30d supply, fill #0

## 2022-04-23 NOTE — Telephone Encounter (Signed)
See below

## 2022-04-23 NOTE — Telephone Encounter (Signed)
Pt will need an appt for this refill if having UTI symptoms.

## 2022-04-23 NOTE — Telephone Encounter (Signed)
Appears we discussed on 08/30/2020-I did say I was okay with postcoital prophylaxis so I sent in prescription as postcoital/after sex

## 2022-04-23 NOTE — Telephone Encounter (Signed)
Informed of message below. States she is not having UTI symptoms; she stated PCP informed her that he would keep her on this periodically to prevent frequent UTIs. Please Advise.

## 2022-04-24 ENCOUNTER — Other Ambulatory Visit (HOSPITAL_COMMUNITY): Payer: Self-pay

## 2022-04-28 ENCOUNTER — Other Ambulatory Visit (HOSPITAL_COMMUNITY): Payer: Self-pay

## 2022-05-26 ENCOUNTER — Other Ambulatory Visit (HOSPITAL_COMMUNITY): Payer: Self-pay

## 2022-06-25 ENCOUNTER — Other Ambulatory Visit: Payer: Self-pay

## 2022-06-25 ENCOUNTER — Other Ambulatory Visit: Payer: Self-pay | Admitting: Family Medicine

## 2022-06-25 ENCOUNTER — Other Ambulatory Visit (HOSPITAL_COMMUNITY): Payer: Self-pay

## 2022-06-25 MED ORDER — PROPRANOLOL HCL ER 60 MG PO CP24
60.0000 mg | ORAL_CAPSULE | Freq: Every day | ORAL | 2 refills | Status: DC
  Filled 2022-06-25: qty 30, 30d supply, fill #0

## 2022-07-02 ENCOUNTER — Ambulatory Visit (INDEPENDENT_AMBULATORY_CARE_PROVIDER_SITE_OTHER): Payer: Commercial Managed Care - PPO | Admitting: Family Medicine

## 2022-07-02 ENCOUNTER — Other Ambulatory Visit (HOSPITAL_COMMUNITY): Payer: Self-pay

## 2022-07-02 ENCOUNTER — Encounter: Payer: Self-pay | Admitting: Family Medicine

## 2022-07-02 VITALS — BP 118/70 | HR 64 | Temp 98.2°F | Ht 64.5 in | Wt 121.6 lb

## 2022-07-02 DIAGNOSIS — Z0000 Encounter for general adult medical examination without abnormal findings: Secondary | ICD-10-CM

## 2022-07-02 DIAGNOSIS — Z23 Encounter for immunization: Secondary | ICD-10-CM | POA: Diagnosis not present

## 2022-07-02 MED ORDER — NITROFURANTOIN MONOHYD MACRO 100 MG PO CAPS
100.0000 mg | ORAL_CAPSULE | Freq: Every day | ORAL | 1 refills | Status: DC | PRN
  Filled 2022-07-02: qty 30, 30d supply, fill #0
  Filled 2022-09-29: qty 30, 30d supply, fill #1

## 2022-07-02 MED ORDER — PROPRANOLOL HCL ER 60 MG PO CP24
60.0000 mg | ORAL_CAPSULE | Freq: Every day | ORAL | 3 refills | Status: DC
  Filled 2022-08-14: qty 90, 90d supply, fill #0

## 2022-07-02 MED ORDER — SUMATRIPTAN SUCCINATE 50 MG PO TABS
50.0000 mg | ORAL_TABLET | Freq: Once | ORAL | 5 refills | Status: DC | PRN
  Filled 2022-07-02: qty 20, 34d supply, fill #0
  Filled 2022-12-23: qty 20, 34d supply, fill #1
  Filled 2023-03-26: qty 20, 34d supply, fill #0

## 2022-07-02 NOTE — Progress Notes (Signed)
Phone [phone]   Subjective:  Patient presents today for their annual physical. Chief complaint-noted.   See problem oriented charting- ROS- full  review of systems was completed and negative Per full ROS sheet completed by patient- other than migraines  The following were reviewed and entered/updated in epic: Past Medical History:  Diagnosis Date   Cough 08/26/2014   GAD (generalized anxiety disorder)    Major depressive disorder, single episode, severe, without mention of psychotic behavior    OCD (obsessive compulsive disorder)    Urinary tract infection, site not specified    chronic since childhood. Has had multiple eval: cysto x 3 all with chronic inflammation with lymphocytis invasion with plasma cells. 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    Priority: Low   Constipation 08/13/2015    Priority: Low   Iron deficiency anemia 03/20/2021   GERD (gastroesophageal reflux disease) 09/28/2017   Hyperlipidemia 03/05/2017   Anemia 08/13/2015   Past Surgical History:  Procedure Laterality Date   CYSTOSCOPY     with uretheral stretches    Family History  Problem Relation Age of Onset   Atrial fibrillation Mother    Hyperlipidemia Mother    Hypothyroidism Mother    Coronary artery disease Father        early 69s, nonsmoker   Hyperlipidemia Father    Colon polyps Father    Lymphoma Father        age 62- in spine but broke his back- treated and doing better   Hypertension Brother    Heart disease Other        dads side   Heart disease Other        dads side   Lung cancer Other    Cancer Other        breast   Cancer Paternal Grandfather    Colon cancer Neg Hx    Breast cancer Neg Hx     Medications- reviewed and updated Current Outpatient Medications   Medication Sig Dispense Refill   amphetamine-dextroamphetamine (ADDERALL XR) 10 MG 24 hr capsule Take 1 capsule (10 mg total) by mouth daily. 90 capsule 0   buPROPion (WELLBUTRIN SR) 150 MG 12 hr tablet Take 1 tablet (150 mg total) by mouth 2 (two) times daily. 180 tablet 3   cholecalciferol (VITAMIN D3) 25 MCG (1000 UNIT) tablet Take 1,000 Units by mouth daily.     estradiol-norethindrone (ACTIVELLA) 1-0.5 MG tablet Take 1 tablet by mouth daily. 90 tablet 3   propranolol ER (INDERAL LA) 60 MG 24 hr capsule Take 1 capsule (60 mg) by mouth daily. 30 capsule 2   SUMAtriptan (IMITREX) 50 MG tablet Take 1-2 tablets (50-100 mg total) by mouth once as needed for up to 1 dose for migraine. May repeat in 2 hours if headache persists or recurs. 20 tablet 2   tinidazole (TINDAMAX) 500 MG tablet Take 1 tablet by mouth twice a day for 5 days. 10 tablet 0   nitrofurantoin, macrocrystal-monohydrate, (MACROBID) 100 MG capsule Take 1 capsule (100 mg total) by mouth daily as needed (postcoital UTI prophylaxis). 30 capsule 1   ondansetron (ZOFRAN) 4 MG tablet Take 1 tablet (4 mg total) by mouth every 8 (eight) hours as needed for nausea or vomiting. (Patient  not taking: Reported on 07/02/2022) 20 tablet 0   No current facility-administered medications for this visit.    Allergies-reviewed and updated Allergies  Allergen Reactions   Ambien [Zolpidem Tartrate]     Patient states this is NOT an allergy she just doesn't like taking the medication     Social History   Social History Narrative   Live with her 2 kids   1 son - '05; 1 daughter  - '03 (off at college in fall 2021)   Divorced    Prior Married '94 - 4 years, Divorced;remarried  '02 later divorced 2016 (ex took it well and still- good relationship with kids)      Cindee Salt, [employer] MSN, [name]       Hobbies: tennis, time with family   Objective  Objective:  BP 118/70   Pulse 64   Temp 98.2 F (36.8 C)   Ht 5' 4.5" (1.638 m)    Wt 121 lb 9.6 oz (55.2 kg)   LMP 07/30/2015 (Approximate)   SpO2 97%   BMI 20.55 kg/m  Gen: NAD, resting comfortably HEENT: Mucous membranes are moist. Oropharynx normal Neck: no thyromegaly CV: RRR no murmurs rubs or gallops Lungs: CTAB no crackles, wheeze, rhonchi Abdomen: soft/nontender/nondistended/normal bowel sounds. No rebound or guarding.  Ext: no edema Skin: warm, dry Neuro: grossly normal, moves all extremities, PERRLA  Assessment and Plan   57 y.o. female presenting for annual physical.  Health Maintenance counseling: 1. Anticipatory guidance: Patient counseled regarding regular dental exams -q6 months, eye exams - yearly,  avoiding smoking and second hand smoke , limiting alcohol to 1 beverage per day , no illicit drugs .   2. Risk factor reduction:  Advised patient of need for regular exercise and diet rich and fruits and vegetables to reduce risk of heart attack and stroke.  Exercise- hiking and doing some pickleball- at least 2 days a week.  Diet/weight management-weight in healthy range/largely stable. Still vegetarian. Occasional dairy.  Wt Readings from Last 3 Encounters:  07/02/22 121 lb 9.6 oz (55.2 kg)  03/10/22 124 lb 12.8 oz (56.6 kg)  08/08/21 127 lb 6.4 oz (57.8 kg)  3. Immunizations/screenings/ancillary studies-opts out of further COVID-19 vaccinations, first Shingrix today- can come back in 2-6 months for repeat, HCV screen - opts out   Immunization History  Administered Date(s) Administered   Influenza,inj,Quad PF,6+ Mos 11/30/2013, 11/22/2018, 12/15/2021   Influenza-Unspecified 11/20/2016   PFIZER(Purple Top)SARS-COV-2 Vaccination 02/06/2019, 02/27/2019   Tdap 03/05/2017   Zoster Recombinat (Shingrix) 07/02/2022  4. Cervical cancer screening- 11/06/2019 on file and HPV negative so technically could wait 5 years 5. Breast cancer screening-  breast exam with GYN and mammogram 02/27/2021 reported but we need copy and she is due for repeat- she will call  back 6. Colon cancer screening - 03/10/2021 with [facility] Dr. Dulce Sellar with 2 sessile serrated polyp- believes 7 year repeat 7. Skin cancer screening- no dermatologist. advised regular sunscreen use. Denies worrisome, changing, or new skin lesions.  8. Birth control/STD check- long-term dating- Monogamous and does not need STD screening.  Just had mirena removed and still on hormone replacement therapy through GYN- considering stopping. postmenopausal 9. Osteoporosis screening at 35- osteopenia previously noted-we have not been able to get records from GYN- she has an upcoming one and will have them send [place] a copy- likely lower risk for progression due to HRT  10. Smoking associated screening - never smoker- just had urine checked with life insurance  Status  of chronic or acute concerns   # Migraines S: Saw Dr. Mardelle Matte in January and they increased Imitrex to 50 mg and was started on Inderal long-acting 60 mg daily for prevention - had about 50% improvement with preventative- about 2 a month that she has to use Imitrex for- very helpful A/P: much improved- thankful for propranolol add on from Dr. Mardelle Matte- continue current medications     # Depression/ADD S: Medication: Wellbutrin 150 mg extended release, Adderall 10 mg extended release    07/02/2022    2:21 PM 03/10/2022   11:28 AM 07/22/2021    2:40 PM  Depression screen PHQ 2/9  Decreased Interest 0 0 0  Down, Depressed, Hopeless 0 0 0  PHQ - 2 Score 0 0 0  Altered sleeping 0 0 0  Tired, decreased energy 0 0 0  Change in appetite 0 0 0  Feeling bad or failure about yourself  0 0 0  Trouble concentrating 0 0 0  Moving slowly or fidgety/restless 0 0 0  Suicidal thoughts 0 0 0  PHQ-9 Score 0 0 0  Difficult doing work/chores Not difficult at all Not difficult at all Not difficult at all   A/P: doing well - continue current medications    #hyperlipidemia #CT calcium scoring 05/23/20- CT calcium score of 0 S: Medication: none- very mild elevations .  Dad early 29's with MI and didn't smoke Lab Results  Component Value Date   CHOL 179 11/22/2018   HDL 58.00 11/22/2018   LDLCALC 106 (H) 11/22/2018   TRIG 74.0 11/22/2018   CHOLHDL 3 11/22/2018   A/P: just had labs through life insurance screening and LDL at 110 and with prior reassuring CT calcium would hold off on medications or repeat at this time even with family history - didn't have CBC but opts out  Recommended follow up: Return in about 1 year (around 07/02/2023) for physical or sooner if needed.Schedule b4 you leave. Or whatever cone required Comprehensive Physical Exam (CPE) preventive care annual visit dates are - I'm ok with 18 or 24 months as well  Lab/Order associations:NOT fasting but no labs needed   ICD-10-CM   1. Preventative health care  Z00.00     2. Need for shingles vaccine  Z23 Zoster Recombinant (Shingrix )     Meds ordered this encounter  Medications   nitrofurantoin, macrocrystal-monohydrate, (MACROBID) 100 MG capsule    Sig: Take 1 capsule (100 mg total) by mouth daily as needed (postcoital UTI prophylaxis).    Dispense:  30 capsule    Refill:  1    Return precautions advised.  Tana Conch, MD

## 2022-07-02 NOTE — Patient Instructions (Addendum)
Shingrix #1 today. Repeat injection in 2-6 months. Schedule a nurse visit for the 2nd injection before you leave today (at the check out desk)  Recommended follow up: Return in about 1 year (around 07/02/2023) for physical or sooner if needed.Schedule b4 you leave.

## 2022-07-03 ENCOUNTER — Other Ambulatory Visit (HOSPITAL_COMMUNITY): Payer: Self-pay

## 2022-07-29 ENCOUNTER — Other Ambulatory Visit: Payer: Self-pay | Admitting: Adult Health

## 2022-07-29 ENCOUNTER — Other Ambulatory Visit (HOSPITAL_COMMUNITY): Payer: Self-pay

## 2022-07-29 ENCOUNTER — Telehealth: Payer: Self-pay | Admitting: Adult Health

## 2022-07-29 DIAGNOSIS — F331 Major depressive disorder, recurrent, moderate: Secondary | ICD-10-CM

## 2022-07-29 MED ORDER — ESZOPICLONE 2 MG PO TABS
2.0000 mg | ORAL_TABLET | Freq: Every evening | ORAL | 2 refills | Status: DC | PRN
  Filled 2022-07-29: qty 30, 30d supply, fill #0
  Filled 2022-09-29: qty 30, 30d supply, fill #1
  Filled 2022-12-23: qty 30, 30d supply, fill #2

## 2022-07-29 NOTE — Telephone Encounter (Signed)
Pt requesting Rx for generic Lunesta 2 or 3 mg. She has taken in the past. Brother suddenly died. She can't sleep. [facility]. Follow up apt due august

## 2022-08-14 ENCOUNTER — Other Ambulatory Visit (HOSPITAL_COMMUNITY): Payer: Self-pay

## 2022-09-29 ENCOUNTER — Other Ambulatory Visit (HOSPITAL_COMMUNITY): Payer: Self-pay

## 2022-09-29 ENCOUNTER — Other Ambulatory Visit: Payer: Self-pay

## 2022-10-28 ENCOUNTER — Encounter: Payer: Self-pay | Admitting: Adult Health

## 2022-10-28 ENCOUNTER — Other Ambulatory Visit (HOSPITAL_COMMUNITY): Payer: Self-pay

## 2022-10-28 ENCOUNTER — Ambulatory Visit: Payer: Commercial Managed Care - PPO | Admitting: Adult Health

## 2022-10-28 ENCOUNTER — Other Ambulatory Visit: Payer: Self-pay

## 2022-10-28 DIAGNOSIS — G4700 Insomnia, unspecified: Secondary | ICD-10-CM

## 2022-10-28 DIAGNOSIS — F331 Major depressive disorder, recurrent, moderate: Secondary | ICD-10-CM | POA: Diagnosis not present

## 2022-10-28 MED ORDER — AMPHETAMINE-DEXTROAMPHET ER 10 MG PO CP24
10.0000 mg | ORAL_CAPSULE | Freq: Every day | ORAL | 0 refills | Status: DC
  Filled 2022-10-28: qty 90, 90d supply, fill #0

## 2022-10-28 MED ORDER — DESVENLAFAXINE SUCCINATE ER 25 MG PO TB24
25.0000 mg | ORAL_TABLET | Freq: Every day | ORAL | 3 refills | Status: DC
  Filled 2022-10-28: qty 90, 90d supply, fill #0

## 2022-10-28 MED ORDER — DESVENLAFAXINE SUCCINATE ER 25 MG PO TB24
25.0000 mg | ORAL_TABLET | Freq: Every day | ORAL | 5 refills | Status: DC
  Filled 2022-10-28: qty 30, 30d supply, fill #0

## 2022-10-28 NOTE — Progress Notes (Signed)
Amanda Alvarez [id number] [birth year] 57 y.o.  Subjective:   [name] is a 57 y.o. (DOB 15-Jun-1965) female.  Chief Complaint: No chief complaint on file.   HPI Amanda Alvarez presents to the office today for follow-up of MDD and insomnia.  Describes mood today as "ok". Pleasant. Denies tearfulness. Mood symptoms - reports some situational depression with loss of brother. Denies anxiety. Reports irritability with loss of sleep. Mood is consistent. Stating "I feel like I'm doing ok". Feels like current medication regimen works well. Family doing well. Stable interest and motivation. Taking medications as prescribed.  Energy levels stable. Active, has a regular exercise routine.  Enjoys some usual interests and activities. In a relationship. Has 2 children. Spending time with family. Appetite adequate. Weight stable - 120 pounds. Reports sleeping difficulties with Wellbutrin. Focus and concentration stable. Completing tasks. Managing aspects of household. Work going well. Denies SI or HI.  Denies AH or VH.  Previous medication trials: Provigil   GAD-7    Flowsheet Row Office Visit from 07/02/2022 in [facility]  Total GAD-7 Score 0      PHQ2-9    Flowsheet Row Office Visit from 07/02/2022 in [facility] from 03/10/2022 in [facility] from 07/22/2021 in [facility] from 07/29/2020 in [facility] Visit from 03/14/2020 in [facility]  PHQ-2 Total Score 0 0 0 0 0  PHQ-9 Total Score 0 0 0 0 --        Review of Systems:  Review of Systems  Musculoskeletal:  Negative for gait problem.  Neurological:  Negative for tremors.  Psychiatric/Behavioral:         Please refer to HPI    Medications: I have reviewed the patient's current medications.  Current  Outpatient Medications  Medication Sig Dispense Refill   amphetamine-dextroamphetamine (ADDERALL XR) 10 MG 24 hr capsule Take 1 capsule (10 mg total) by mouth daily. 90 capsule 0   buPROPion (WELLBUTRIN SR) 150 MG 12 hr tablet Take 1 tablet (150 mg total) by mouth 2 (two) times daily. 180 tablet 3   cholecalciferol (VITAMIN D3) 25 MCG (1000 UNIT) tablet Take 1,000 Units by mouth daily.     estradiol-norethindrone (ACTIVELLA) 1-0.5 MG tablet Take 1 tablet by mouth daily. 90 tablet 3   eszopiclone (LUNESTA) 2 MG TABS tablet Take 1 tablet (2 mg total) by mouth at bedtime as needed for sleep. Take immediately before bedtime 30 tablet 2   nitrofurantoin, macrocrystal-monohydrate, (MACROBID) 100 MG capsule Take 1 capsule (100 mg total) by mouth daily as needed for postcoital UTI prophylaxis. 30 capsule 1   propranolol ER (INDERAL LA) 60 MG 24 hr capsule Take 1 capsule (60 mg) by mouth daily. 90 capsule 3   SUMAtriptan (IMITREX) 50 MG tablet Take 1-2 tablets (50-100 mg total) by mouth once as needed for up to 1 dose for migraine. May repeat in 2 hours if headache persists or recurs. 20 tablet 5   tinidazole (TINDAMAX) 500 MG tablet Take 1 tablet by mouth twice a day for 5 days. 10 tablet 0   No current facility-administered medications for this visit.    Medication Side Effects: None  Allergies:  Allergies  Allergen Reactions   Ambien [Zolpidem Tartrate]     Patient states this is NOT an allergy she just doesn't like taking the medication     Past  Medical History:  Diagnosis Date   Cough 08/26/2014   GAD (generalized anxiety disorder)    Major depressive disorder, single episode, severe, without mention of psychotic behavior    OCD (obsessive compulsive disorder)    Urinary tract infection, site not specified    chronic since childhood. Has had multiple eval: cysto x 3 all with chronic inflammation with lymphocytis invasion with plasma cells. for suppresion    Past Medical History, Surgical  history, Social history, and Family history were reviewed and updated as appropriate.   Please see review of systems for further details on the patient's review from today.   Objective:   Physical Exam:  LMP 07/30/2015 (Approximate)   Physical Exam Constitutional:      General: She is not in acute distress. Musculoskeletal:        General: No deformity.  Neurological:     Mental Status: She is alert and oriented to person, place, and time.     Coordination: Coordination normal.  Psychiatric:        Attention and Perception: Attention and perception normal. She does not perceive auditory or visual hallucinations.        Mood and Affect: Affect is not labile, blunt, angry or inappropriate.        Speech: Speech normal.        Behavior: Behavior normal.        Thought Content: Thought content normal. Thought content is not paranoid or delusional. Thought content does not include homicidal or suicidal ideation. Thought content does not include homicidal or suicidal plan.        Cognition and Memory: Cognition and memory normal.        Judgment: Judgment normal.     Comments: Insight intact     Lab Review:     Component Value Date/Time   NA 143 11/22/2018 1553   K 4.3 11/22/2018 1553   CL 105 11/22/2018 1553   CO2 27 11/22/2018 1553   GLUCOSE 93 11/22/2018 1553   BUN 9 11/22/2018 1553   CREATININE 0.65 11/22/2018 1553   CALCIUM 9.7 11/22/2018 1553   PROT 6.8 11/22/2018 1553   ALBUMIN 4.4 11/22/2018 1553   AST 12 11/22/2018 1553   ALT 8 11/22/2018 1553   ALKPHOS 61 11/22/2018 1553   BILITOT 0.5 11/22/2018 1553   GFRNONAA >60 08/27/2014 2240   GFRAA >60 08/27/2014 2240       Component Value Date/Time   WBC 5.1 11/22/2018 1553   RBC 4.27 11/22/2018 1553   HGB 13.4 11/22/2018 1553   HCT 39.9 11/22/2018 1553   PLT 301.0 11/22/2018 1553   MCV 93.5 11/22/2018 1553   MCH 28.8 08/27/2014 2240   MCHC 33.5 11/22/2018 1553   RDW 12.8 11/22/2018 1553   LYMPHSABS 2.0  08/13/2015 1027   MONOABS 0.4 08/13/2015 1027   EOSABS 0.1 08/13/2015 1027   BASOSABS 0.0 08/13/2015 1027    No results found for: "POCLITH", "LITHIUM"   No results found for: "PHENYTOIN", "PHENOBARB", "VALPROATE", "CBMZ"   .res Assessment: Plan:    Plan:  PDMP reviewed  Adderall XR 10mg  capsule - taking  2 to 3 times a week  D/C Wellbutrin SR 150mg  twice daily Add Pristiq 25mg  daily  Lunesta 2mg  at hs   BP WNL per patient  RTC 6 months  Patient advised to contact office with any questions, adverse effects, or acute worsening in signs and symptoms.  Discussed potential benefits, risks, and side effects of stimulants with patient to include  increased heart rate, palpitations, insomnia, increased anxiety, increased irritability, or decreased appetite.  Instructed patient to contact office if experiencing any significant tolerability issues.  There are no diagnoses linked to this encounter.   Please see After Visit Summary for patient specific instructions.  Future Appointments  Date Time Provider Department Center  10/28/2022  8:40 AM Pollyanna Levay, Thereasa Solo, NP [facility] None    No orders of the defined types were placed in this encounter.   -------------------------------

## 2022-11-12 ENCOUNTER — Ambulatory Visit: Payer: Commercial Managed Care - PPO | Admitting: Family Medicine

## 2022-11-12 ENCOUNTER — Other Ambulatory Visit (HOSPITAL_COMMUNITY): Payer: Self-pay

## 2022-11-12 ENCOUNTER — Encounter: Payer: Self-pay | Admitting: Family Medicine

## 2022-11-12 VITALS — BP 118/72 | HR 62 | Temp 98.2°F | Ht 64.5 in | Wt 121.2 lb

## 2022-11-12 DIAGNOSIS — G43009 Migraine without aura, not intractable, without status migrainosus: Secondary | ICD-10-CM | POA: Diagnosis not present

## 2022-11-12 DIAGNOSIS — Z23 Encounter for immunization: Secondary | ICD-10-CM

## 2022-11-12 MED ORDER — NURTEC 75 MG PO TBDP: 75.0000 mg | ORAL_TABLET | ORAL | 11 refills | Status: DC

## 2022-11-12 MED ORDER — ONDANSETRON HCL 4 MG PO TABS
4.0000 mg | ORAL_TABLET | Freq: Three times a day (TID) | ORAL | 2 refills | Status: DC | PRN
  Filled 2022-11-12: qty 20, 7d supply, fill #0

## 2022-11-12 NOTE — Progress Notes (Signed)
Subjective  CC:  Chief Complaint  Patient presents with   Headache    Pt here to discuss headaches for years, pt tried beta blocker, help some but stopped working     HPI: Amanda Alvarez is a 57 y.o. female who presents to the office today to address the problems listed above in the chief complaint. January returns due to migraine headaches.  I saw her back in January. Has had long history of left-sided migrainous headaches associated with pain over left sinus shooting back behind left eye.  We started a preventative, Inderal LA 60 and increase her Imitrex dose.  This initially worked very well.  Limiting headaches to 2 to 3/month down from 2 to 3/week.  However, about 2 months ago, the frequency of her headaches increased again.  Now having frequent headaches.  Aborts with Imitrex but it takes about an hour to do so.  There has been no change in quality of her headache.  Typically awakens with headache in the morning, left from left sinus area shooting back to the left posterior head.  No auras, visual changes or other neurologic symptoms.  Has mild nausea.  Unclear triggers.  Denies stressors.  She is postmenopausal and was on hormone therapy.  She stopped the hormones in case it was related to this, no change.   Assessment  1. Migraine without aura and without status migrainosus, not intractable   2. Need for influenza vaccination   3. Need for shingles vaccine      Plan  Migraines: Trial of Nurtec ODT every other day prevention.  Stop propranolol.  Can use Imitrex in addition if needed.  Hopefully this will calm down headaches again.  If not she will return for other preventive and treatment options.  No red flags noted. Immunizations: Updated second shingles vaccine and flu shot today.  Follow up: As needed Visit date not found  Orders Placed This Encounter  Procedures   Flu vaccine trivalent PF, 6mos and older(Flulaval,Afluria,Fluarix,Fluzone)   Zoster Recombinant (Shingrix )    Meds ordered this encounter  Medications   Rimegepant Sulfate (NURTEC) 75 MG TBDP    Sig: Take 1 tablet (75 mg total) by mouth every other day.    Dispense:  16 tablet    Refill:  11      I reviewed the patients updated PMH, FH, and SocHx.    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   cholecalciferol (VITAMIN D3) 25 MCG (1000 UNIT) tablet Take 1,000 Units by mouth daily.   desvenlafaxine (PRISTIQ) 25 MG 24 hr tablet Take 1 tablet (25 mg total) by mouth daily.   estradiol-norethindrone (ACTIVELLA) 1-0.5 MG tablet Take 1 tablet by mouth daily.   eszopiclone (LUNESTA) 2 MG TABS tablet Take 1 tablet (2 mg total) by mouth at bedtime as needed for sleep. Take immediately before bedtime   nitrofurantoin, macrocrystal-monohydrate, (MACROBID) 100 MG capsule Take 1 capsule (100 mg total) by mouth daily as needed for postcoital UTI prophylaxis.   Rimegepant Sulfate (NURTEC) 75 MG TBDP Take 1 tablet (75 mg  total) by mouth every other day.   SUMAtriptan (IMITREX) 50 MG tablet Take 1-2 tablets (50-100 mg total) by mouth once as needed for up to 1 dose for migraine. May repeat in 2 hours if headache persists or recurs.   tinidazole (TINDAMAX) 500 MG tablet Take 1 tablet by mouth twice a day for 5 days.   [DISCONTINUED] propranolol ER (INDERAL LA) 60 MG 24 hr capsule Take 1 capsule (60 mg) by mouth daily.    Allergies: Patient is allergic to [employer] tartrate]. Family History: Patient family history includes Atrial fibrillation in her mother; Cancer in her  paternal grandfather and another family member; Colon polyps in her father; Coronary artery disease in her father; Heart disease in some other family members; Hyperlipidemia in her father and mother; Hypertension in her brother; Hypothyroidism in her mother; Lung cancer in an other family member; Lymphoma in her father. Social History:  Patient  reports that she has never smoked. She has never used smokeless tobacco. She reports current alcohol use. She reports that she does not use drugs.  Review of Systems: Constitutional: Negative for fever malaise or anorexia Cardiovascular: negative for chest pain Respiratory: negative for SOB or persistent cough Gastrointestinal: negative for abdominal pain  Objective  Vitals: BP 118/72   Pulse 62   Temp 98.2 F (36.8 C)   Ht 5' 4.5" (1.638 m)   Wt 121 lb 3.2 oz (55 kg)   LMP 07/30/2015 (Approximate)   SpO2 97%   BMI 20.48 kg/m  General: no acute distress , A&Ox3  Commons side effects, risks, benefits, and alternatives for medications and treatment plan prescribed today were discussed, and the patient expressed understanding of the given instructions. Patient is instructed to call or message via MyChart if he/she has any questions or concerns regarding our treatment plan. No barriers to understanding were identified. We discussed Red Flag symptoms and signs in detail. Patient expressed understanding regarding what to do in case of urgent or emergency type symptoms.  Medication list was reconciled, printed and provided to the patient in AVS. Patient instructions and summary information was reviewed with the patient as documented in the AVS. This note was prepared with assistance of Dragon voice recognition software. Occasional wrong-word or sound-a-like substitutions may have occurred due to the inherent limitations of voice recognition software

## 2022-11-13 ENCOUNTER — Other Ambulatory Visit (HOSPITAL_COMMUNITY): Payer: Self-pay

## 2022-11-13 MED ORDER — NURTEC 75 MG PO TBDP
75.0000 mg | ORAL_TABLET | ORAL | 11 refills | Status: DC
Start: 2022-11-12 — End: 2023-09-30
  Filled 2022-11-13: qty 16, 30d supply, fill #0
  Filled 2022-12-23 – 2023-01-13 (×2): qty 16, 30d supply, fill #1
  Filled 2023-03-26: qty 16, 30d supply, fill #0
  Filled 2023-06-24: qty 16, 30d supply, fill #1

## 2022-11-24 ENCOUNTER — Other Ambulatory Visit (HOSPITAL_COMMUNITY): Payer: Self-pay

## 2022-11-24 MED ORDER — TRAMADOL HCL 50 MG PO TABS
ORAL_TABLET | ORAL | 0 refills | Status: DC
  Filled 2022-11-24: qty 30, 5d supply, fill #0

## 2022-11-27 ENCOUNTER — Telehealth: Payer: Commercial Managed Care - PPO | Admitting: Family Medicine

## 2022-11-27 ENCOUNTER — Other Ambulatory Visit (HOSPITAL_COMMUNITY): Payer: Self-pay

## 2022-11-27 DIAGNOSIS — J40 Bronchitis, not specified as acute or chronic: Secondary | ICD-10-CM | POA: Diagnosis not present

## 2022-11-27 MED ORDER — BENZONATATE 200 MG PO CAPS
200.0000 mg | ORAL_CAPSULE | Freq: Two times a day (BID) | ORAL | 0 refills | Status: DC | PRN
  Filled 2022-11-27: qty 20, 10d supply, fill #0

## 2022-11-27 MED ORDER — PROMETHAZINE-DM 6.25-15 MG/5ML PO SYRP
5.0000 mL | ORAL_SOLUTION | Freq: Every evening | ORAL | 0 refills | Status: AC
  Filled 2022-11-27: qty 118, 23d supply, fill #0

## 2022-11-27 MED ORDER — DOXYCYCLINE HYCLATE 100 MG PO TABS
100.0000 mg | ORAL_TABLET | Freq: Two times a day (BID) | ORAL | 0 refills | Status: AC
  Filled 2022-11-27: qty 20, 10d supply, fill #0

## 2022-11-27 NOTE — Patient Instructions (Signed)

## 2022-11-27 NOTE — Progress Notes (Signed)
Virtual Visit Consent   [name], you are scheduled for a virtual visit with a [facility] provider today. Just as with appointments in the office, your consent must be obtained to participate. Your consent will be active for this visit and any virtual visit you may have with one of our providers in the next 365 days. If you have a MyChart account, a copy of this consent can be sent to you electronically.  As this is a virtual visit, video technology does not allow for your provider to perform a traditional examination. This may limit your provider's ability to fully assess your condition. If your provider identifies any concerns that need to be evaluated in person or the need to arrange testing (such as labs, EKG, etc.), we will make arrangements to do so. Although advances in technology are sophisticated, we cannot ensure that it will always work on either your end or our end. If the connection with a video visit is poor, the visit may have to be switched to a telephone visit. With either a video or telephone visit, we are not always able to ensure that we have a secure connection.  By engaging in this virtual visit, you consent to the provision of healthcare and authorize for your insurance to be billed (if applicable) for the services provided during this visit. Depending on your insurance coverage, you may receive a charge related to this service.  I need to obtain your verbal consent now. Are you willing to proceed with your visit today? STEVEY STAPLETON has provided verbal consent on 11/27/2022 for a virtual visit (video or telephone). Georgana Curio, FNP  Date: 11/27/2022 9:05 AM  Virtual Visit via Video Note   I, Georgana Curio, connected with  Amanda Alvarez  ([id number], 10/15/1965) on 11/27/22 at  9:00 AM EDT by a video-enabled telemedicine application and verified that I am speaking with the correct person using two identifiers.  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 The patient expressed understanding and agreed to proceed.    History of Present Illness: SUJEY Alvarez is a 57 y.o. who identifies as a female who was assigned female at birth, and is being seen today for cough and uri sx for over 10 days, cough worsening, no wheezing or sob, no fever, neg at home covid tests, prod cough- green mucus. Nasal congestion. She does not like antibiotics however sx have been present so long and cough so persistent she feels she my need one now. 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   Constipation 08/13/2015   Anemia 08/13/2015   Depression 12/13/2007   Chronic UTI 08/22/2007    Allergies:  Allergies  Allergen Reactions   Ambien [Zolpidem Tartrate]     Patient states this is NOT an allergy she just doesn't like taking the medication    Medications:  Current Outpatient Medications:    amphetamine-dextroamphetamine (ADDERALL XR) 10 MG 24 hr capsule, Take 1 capsule (10 mg total) by mouth daily., Disp: 90 capsule, Rfl: 0   cholecalciferol (VITAMIN D3) 25 MCG (1000 UNIT) tablet, Take 1,000 Units by mouth daily., Disp: , Rfl:  desvenlafaxine (PRISTIQ) 25 MG 24 hr tablet, Take 1 tablet (25 mg total) by mouth daily., Disp: 90 tablet, Rfl: 3   estradiol-norethindrone (ACTIVELLA) 1-0.5 MG tablet, Take 1 tablet by mouth daily., Disp: 90 tablet, Rfl: 3   eszopiclone (LUNESTA) 2 MG TABS tablet, Take 1 tablet (2 mg total) by mouth at bedtime as needed for sleep. Take immediately before bedtime, Disp: 30 tablet, Rfl: 2   nitrofurantoin, macrocrystal-monohydrate, (MACROBID) 100 MG capsule, Take 1  capsule (100 mg total) by mouth daily as needed for postcoital UTI prophylaxis., Disp: 30 capsule, Rfl: 1   ondansetron (ZOFRAN) 4 MG tablet, Take 1 tablet (4 mg total) by mouth every 8 (eight) hours as needed for nausea or vomiting., Disp: 20 tablet, Rfl: 2   Rimegepant Sulfate (NURTEC) 75 MG TBDP, Take 1 tablet (75 mg total) by mouth every other day., Disp: 16 tablet, Rfl: 11   Rimegepant Sulfate (NURTEC) 75 MG TBDP, Take 1 tablet (75 mg total) by mouth every other day., Disp: 16 tablet, Rfl: 11   SUMAtriptan (IMITREX) 50 MG tablet, Take 1-2 tablets (50-100 mg total) by mouth once as needed for up to 1 dose for migraine. May repeat in 2 hours if headache persists or recurs., Disp: 20 tablet, Rfl: 5   tinidazole (TINDAMAX) 500 MG tablet, Take 1 tablet by mouth twice a day for 5 days., Disp: 10 tablet, Rfl: 0   traMADol (ULTRAM) 50 MG tablet, Take 1 tablet by mouth as directed every 4 to 6 hours as needed for pain, Disp: 30 tablet, Rfl: 0  Observations/Objective: Patient is well-developed, well-nourished in no acute distress.  Resting comfortably  at home.  Head is normocephalic, atraumatic.  No labored breathing.  Speech is clear and coherent with logical content.  Patient is alert and oriented at baseline.    Assessment and Plan: 1. Acute pharyngitis, unspecified etiology  Increase fluids, humidifier at night, tylenol or ibuprofen as directed, alternate prometh dm and tessalon as discussed. UC if sx persist or worsen.   Follow Up Instructions: I discussed the assessment and treatment plan with the patient. The patient was provided an opportunity to ask questions and all were answered. The patient agreed with the plan and demonstrated an understanding of the instructions.  A copy of instructions were sent to the patient via MyChart unless otherwise noted below.     The patient was advised to call back or seek an in-person evaluation if the symptoms worsen or if the condition fails to  improve as anticipated.    Georgana Curio, FNP

## 2022-12-11 ENCOUNTER — Other Ambulatory Visit (HOSPITAL_COMMUNITY): Payer: Self-pay

## 2022-12-11 MED ORDER — METHYLPREDNISOLONE 4 MG PO TBPK
ORAL_TABLET | ORAL | 0 refills | Status: DC
  Filled 2022-12-11: qty 21, 6d supply, fill #0

## 2022-12-12 ENCOUNTER — Other Ambulatory Visit (HOSPITAL_COMMUNITY): Payer: Self-pay

## 2022-12-23 ENCOUNTER — Other Ambulatory Visit (HOSPITAL_COMMUNITY): Payer: Self-pay

## 2022-12-25 ENCOUNTER — Other Ambulatory Visit (HOSPITAL_COMMUNITY): Payer: Self-pay

## 2023-01-01 ENCOUNTER — Other Ambulatory Visit (HOSPITAL_COMMUNITY): Payer: Self-pay

## 2023-01-01 ENCOUNTER — Telehealth: Payer: Self-pay | Admitting: Family Medicine

## 2023-01-01 ENCOUNTER — Telehealth: Payer: Self-pay

## 2023-01-01 NOTE — Telephone Encounter (Signed)
PT STATES PHARMACY STATED THE RX NEEDS PA BY INSURANCE    Prescription Request  01/01/2023  LOV: 07/02/2022  What is the name of the medication or equipment?  Rimegepant Sulfate (NURTEC) 75 MG TBDP   Have you contacted your pharmacy to request a refill? Yes   Which pharmacy would you like this sent to?  - [facility] [address] [place] [address] Phone: [phone] Fax: [phone]    Patient notified that their request is being sent to the clinical staff for review and that they should receive a response within 2 business days.   Please advise at Mobile [phone] (mobile)

## 2023-01-01 NOTE — Telephone Encounter (Signed)
Pharmacy Patient Advocate Encounter   Received notification from Pt Calls Messages that prior authorization for Nurtec 75mg  tabs is required/requested.   Insurance verification completed.   The patient is insured through [facility] .   Per test claim: PA required; PA submitted to above mentioned insurance via CoverMyMeds Key/confirmation #/EOC [id number] Status is pending

## 2023-01-04 ENCOUNTER — Other Ambulatory Visit (HOSPITAL_COMMUNITY): Payer: Self-pay

## 2023-01-04 DIAGNOSIS — N760 Acute vaginitis: Secondary | ICD-10-CM | POA: Diagnosis not present

## 2023-01-04 DIAGNOSIS — N952 Postmenopausal atrophic vaginitis: Secondary | ICD-10-CM | POA: Diagnosis not present

## 2023-01-04 MED ORDER — ESTRADIOL 0.1 MG/GM VA CREA
0.1000 g | TOPICAL_CREAM | Freq: Two times a day (BID) | VAGINAL | 2 refills | Status: DC
  Filled 2023-01-04: qty 42.5, 30d supply, fill #0

## 2023-01-04 MED ORDER — METRONIDAZOLE 500 MG PO TABS
500.0000 mg | ORAL_TABLET | Freq: Two times a day (BID) | ORAL | 0 refills | Status: DC
  Filled 2023-01-04: qty 14, 7d supply, fill #0

## 2023-01-04 MED ORDER — AZITHROMYCIN 500 MG PO TABS
ORAL_TABLET | ORAL | 0 refills | Status: DC
  Filled 2023-01-04: qty 1, 1d supply, fill #0

## 2023-01-05 DIAGNOSIS — R2231 Localized swelling, mass and lump, right upper limb: Secondary | ICD-10-CM | POA: Diagnosis not present

## 2023-01-06 ENCOUNTER — Other Ambulatory Visit (HOSPITAL_COMMUNITY): Payer: Self-pay

## 2023-01-06 NOTE — Telephone Encounter (Signed)
Pharmacy Patient Advocate Encounter  Received notification from [facility] that Prior Authorization for Nurtec 75mg  dispersible tablets has been APPROVED from 01/04/2023 to 07/03/2023. Ran test claim, Copay is $0.00. This test claim was processed through [facility]- copay amounts may vary at other pharmacies due to pharmacy/plan contracts, or as the patient moves through the different stages of their insurance plan.   PA #/Case ID/Reference #: [id number]

## 2023-01-07 NOTE — Telephone Encounter (Signed)
Rx was sent to the pharmacy on 11/13/22 with 11 refills.

## 2023-01-07 NOTE — Telephone Encounter (Signed)
Please send RX to pharmacy.

## 2023-01-08 ENCOUNTER — Other Ambulatory Visit (HOSPITAL_COMMUNITY): Payer: Self-pay

## 2023-01-13 ENCOUNTER — Other Ambulatory Visit (HOSPITAL_COMMUNITY): Payer: Self-pay

## 2023-01-15 ENCOUNTER — Other Ambulatory Visit (HOSPITAL_COMMUNITY): Payer: Self-pay

## 2023-01-18 ENCOUNTER — Telehealth: Payer: Self-pay | Admitting: Adult Health

## 2023-01-18 NOTE — Telephone Encounter (Signed)
Patient called in regarding Pristiq 25mg  she would like to increase dosage from 25mg  to 50mg . Ph: [phone] appt 1/20 Pharmacy [facility] pharmacy [address] [place], [place]

## 2023-01-18 NOTE — Telephone Encounter (Signed)
Patient requesting to increase Pristiq to 50 mg. She has reported no SE, but reports not sleeping as well as she had hoped to. Does report some improvement in sleep since stopping the Wellbutrin, just still room for improvement.

## 2023-01-19 ENCOUNTER — Other Ambulatory Visit (HOSPITAL_COMMUNITY): Payer: Self-pay

## 2023-01-19 MED ORDER — DESVENLAFAXINE SUCCINATE ER 50 MG PO TB24
50.0000 mg | ORAL_TABLET | Freq: Every day | ORAL | 2 refills | Status: DC
  Filled 2023-01-19: qty 30, 30d supply, fill #0

## 2023-01-19 NOTE — Telephone Encounter (Signed)
Rx sent for 50 mg Pristiq, discontinued 25 mg. Sent MyChart message to patient with this info.

## 2023-03-08 ENCOUNTER — Other Ambulatory Visit: Payer: Self-pay

## 2023-03-08 ENCOUNTER — Telehealth: Payer: Commercial Managed Care - PPO | Admitting: Adult Health

## 2023-03-08 ENCOUNTER — Other Ambulatory Visit (HOSPITAL_COMMUNITY): Payer: Self-pay

## 2023-03-08 ENCOUNTER — Encounter: Payer: Self-pay | Admitting: Adult Health

## 2023-03-08 DIAGNOSIS — G4700 Insomnia, unspecified: Secondary | ICD-10-CM

## 2023-03-08 DIAGNOSIS — F331 Major depressive disorder, recurrent, moderate: Secondary | ICD-10-CM | POA: Diagnosis not present

## 2023-03-08 MED ORDER — AMPHETAMINE-DEXTROAMPHET ER 5 MG PO CP24
5.0000 mg | ORAL_CAPSULE | Freq: Every day | ORAL | 0 refills | Status: DC
  Filled 2023-03-08: qty 90, 90d supply, fill #0

## 2023-03-08 MED ORDER — ESZOPICLONE 2 MG PO TABS
2.0000 mg | ORAL_TABLET | Freq: Every evening | ORAL | 2 refills | Status: DC | PRN
  Filled 2023-03-08 – 2023-05-19 (×2): qty 30, 30d supply, fill #0

## 2023-03-08 MED ORDER — DESVENLAFAXINE SUCCINATE ER 50 MG PO TB24
50.0000 mg | ORAL_TABLET | Freq: Every day | ORAL | 3 refills | Status: DC
  Filled 2023-03-08: qty 90, 90d supply, fill #0

## 2023-03-08 NOTE — Progress Notes (Signed)
Amanda Alvarez [id number] 1965/07/28 58 y.o.  Virtual Visit via Video Note  I connected with pt @ on 03/08/23 at  9:30 AM EST by a video enabled telemedicine application and verified that I am speaking with the correct person using two identifiers.   I discussed the limitations of evaluation and management by telemedicine and the availability of in person appointments. The patient expressed understanding and agreed to proceed.  I discussed the assessment and treatment plan with the patient. The patient was provided an opportunity to ask questions and all were answered. The patient agreed with the plan and demonstrated an understanding of the instructions.   The patient was advised to call back or seek an in-person evaluation if the symptoms worsen or if the condition fails to improve as anticipated.  I provided 10 minutes of non-face-to-face time during this encounter.  The patient was located at home.  The provider was located at [facility].   Dorothyann Gibbs, NP   Subjective:   [name] is a 58 y.o. (DOB 1965-05-13) female.  Chief Complaint: No chief complaint on file.   HPI Amanda Alvarez presents for follow-up of  MDD and insomnia.  Describes mood today as "ok". Pleasant. Denies tearfulness. Mood symptoms -denies depression, anxiety, and irritability. Denies panic attacks. Denies worry, rumination, and over thinking. Mood is consistent. Stating "I feel like I'm doing good". Feels like current medication regimen works well. Family doing well. Stable interest and motivation. Taking medications as prescribed.  Energy levels stable. Active, has a regular exercise routine.  Enjoys some usual interests and activities. In a relationship. Has 2 children. Spending time with family. Appetite adequate. Weight stable - 120 pounds. Sleeping better at night. Averages 6 hours a night. Focus and concentration stable. Completing tasks. Managing aspects of  household. Work going well. Denies SI or HI.  Denies AH or VH.  Previous medication trials: Provigil   Review of Systems:  Review of Systems  Musculoskeletal:  Negative for gait problem.  Neurological:  Negative for tremors.  Psychiatric/Behavioral:         Please refer to HPI    Medications: I have reviewed the patient's current medications.  Current Outpatient Medications  Medication Sig Dispense Refill   amphetamine-dextroamphetamine (ADDERALL XR) 10 MG 24 hr capsule Take 1 capsule (10 mg total) by mouth daily. 90 capsule 0   azithromycin (ZITHROMAX) 500 MG tablet Take 1 tablet 1 hour prior to surgery 1 tablet 0   benzonatate (TESSALON) 200 MG capsule Take 1 capsule (200 mg total) by mouth 2 (two) times daily as needed for cough. 20 capsule 0   cholecalciferol (VITAMIN D3) 25 MCG (1000 UNIT) tablet Take 1,000 Units by mouth daily.     desvenlafaxine (PRISTIQ) 50 MG 24 hr tablet Take 1 tablet (50 mg total) by mouth daily. 30 tablet 2   estradiol (ESTRACE) 0.1 MG/GM vaginal cream Place 0.1 g vaginally 2 (two) times daily at bedtime 42.5 g 2   estradiol-norethindrone (ACTIVELLA) 1-0.5 MG tablet Take 1 tablet by mouth daily. 90 tablet 3   eszopiclone (LUNESTA) 2 MG TABS tablet Take 1 tablet (2 mg total) by mouth at bedtime as needed for sleep. Take immediately before bedtime 30 tablet 2   methylPREDNISolone (MEDROL) 4 MG TBPK tablet Take tablets as directed on package 21 each 0   metroNIDAZOLE (FLAGYL) 500 MG tablet Take 1 tablet (500 mg total) by mouth 2 (two) times daily for 7 days. *DO NOT  DRINK ALCOHOL WHILE TAKING THIS MEDICATION* 14 tablet 0   nitrofurantoin, macrocrystal-monohydrate, (MACROBID) 100 MG capsule Take 1 capsule (100 mg total) by mouth daily as needed for postcoital UTI prophylaxis. 30 capsule 1   ondansetron (ZOFRAN) 4 MG tablet Take 1 tablet (4 mg total) by mouth every 8 (eight) hours as needed for nausea or vomiting. 20 tablet 2   Rimegepant Sulfate (NURTEC) 75 MG  TBDP Take 1 tablet (75 mg total) by mouth every other day. 16 tablet 11   Rimegepant Sulfate (NURTEC) 75 MG TBDP Take 1 tablet (75 mg total) by mouth every other day. 16 tablet 11   SUMAtriptan (IMITREX) 50 MG tablet Take 1-2 tablets (50-100 mg total) by mouth once as needed for up to 1 dose for migraine. May repeat in 2 hours if headache persists or recurs. 20 tablet 5   traMADol (ULTRAM) 50 MG tablet Take 1 tablet by mouth as directed every 4 to 6 hours as needed for pain 30 tablet 0   No current facility-administered medications for this visit.    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 Has had multiple eval: cysto x 3 all with chronic inflammation with lymphocytis invasion with plasma cells. for suppresion    Family History  Problem Relation Age of Onset   Atrial fibrillation Mother    Hyperlipidemia Mother    Hypothyroidism Mother    Coronary artery disease Father        early 93s, nonsmoker   Hyperlipidemia Father    Colon polyps Father    Lymphoma Father        age 28- in spine but broke his back- treated and doing better   Hypertension Brother    Heart disease Other        dads side   Heart disease Other        dads side   Lung cancer Other    Cancer Other        breast   Cancer Paternal Grandfather    Colon cancer Neg Hx    Breast cancer Neg Hx     Social History   Socioeconomic History   Marital status: Married    Spouse name: Not on file   Number of children: 2   Years of education: 18   Highest education  level: Not on file  Occupational History   Occupation: NURSE PRACTITIONER    Employer: [employer]    Comment: Adult nurse GI   Occupation: NURSE PRACTITIONER    Employer: [employer]  Tobacco Use   Smoking status: Never   Smokeless tobacco: Never  Substance and Sexual Activity   Alcohol use: Yes    Alcohol/week: 0.0 - 1.0 standard drinks of alcohol   Drug use: No   Sexual activity: Yes    Partners: Male  Other Topics Concern   Not on file  Social History Narrative   Live with her 2 kids   1 son - '05; 1 daughter  - '03 (off at college in fall  2021)   Divorced    Prior Married '94 - 4 years, Divorced;remarried  '02 later divorced 2016 (ex took it well and still- good relationship with kids)      Cindee Salt, [employer] MSN, [name]       Hobbies: tennis, time with family   Social Drivers of Corporate investment banker Strain: Not on file  Food Insecurity: Not on file  Transportation Needs: Not on file  Physical Activity: Not on file  Stress: Not on file  Social Connections: Not on file  Intimate Partner Violence: Not on file    Past Medical History, Surgical history, Social history, and Family history were reviewed and updated as appropriate.   Please see review of systems for further details on the patient's review from today.   Objective:   Physical Exam:  LMP 07/30/2015 (Approximate)   Physical Exam Constitutional:      General: She is not in acute distress. Musculoskeletal:        General: No deformity.  Neurological:     Mental Status: She is alert and oriented to person, place, and time.     Coordination: Coordination normal.  Psychiatric:        Attention and Perception: Attention and perception normal. She does not perceive auditory or visual hallucinations.        Mood and Affect: Mood normal. Mood is not anxious or depressed. Affect is not labile, blunt, angry or inappropriate.        Speech: Speech normal.        Behavior: Behavior  normal.        Thought Content: Thought content normal. Thought content is not paranoid or delusional. Thought content does not include homicidal or suicidal ideation. Thought content does not include homicidal or suicidal plan.        Cognition and Memory: Cognition and memory normal.        Judgment: Judgment normal.     Comments: Insight intact     Lab Review:     Component Value Date/Time   NA 143 11/22/2018 1553   K 4.3 11/22/2018 1553   CL 105 11/22/2018 1553   CO2 27 11/22/2018 1553   GLUCOSE 93 11/22/2018 1553   BUN 9 11/22/2018 1553   CREATININE 0.65 11/22/2018 1553   CALCIUM 9.7 11/22/2018 1553   PROT 6.8 11/22/2018 1553   ALBUMIN 4.4 11/22/2018 1553   AST 12 11/22/2018 1553   ALT 8 11/22/2018 1553   ALKPHOS 61 11/22/2018 1553   BILITOT 0.5 11/22/2018 1553   GFRNONAA >60 08/27/2014 2240   GFRAA >60 08/27/2014 2240       Component Value Date/Time   WBC 5.1 11/22/2018 1553   RBC 4.27 11/22/2018 1553   HGB 13.4 11/22/2018 1553   HCT 39.9 11/22/2018 1553   PLT 301.0 11/22/2018 1553   MCV 93.5 11/22/2018 1553   MCH 28.8 08/27/2014 2240   MCHC 33.5 11/22/2018 1553   RDW 12.8 11/22/2018 1553   LYMPHSABS 2.0 08/13/2015 1027   MONOABS 0.4 08/13/2015 1027   EOSABS 0.1 08/13/2015 1027   BASOSABS 0.0 08/13/2015 1027    No results found for: "POCLITH", "LITHIUM"   No results found for: "PHENYTOIN", "PHENOBARB", "VALPROATE", "CBMZ"   .res Assessment: Plan:    Plan:  PDMP reviewed  D/C Adderall XR 10mg  capsule - taking  2 to 3 times a week Add Adderall XR 5mg  capsule  Pristiq 50mg  daily  Lunesta 2mg  at hs  BP WNL per  patient  RTC 6 months  Patient advised to contact office with any questions, adverse effects, or acute worsening in signs and symptoms.  Discussed potential benefits, risks, and side effects of stimulants with patient to include increased heart rate, palpitations, insomnia, increased anxiety, increased irritability, or decreased appetite.   Instructed patient to contact office if experiencing any significant tolerability issues.  There are no diagnoses linked to this encounter.   Please see After Visit Summary for patient specific instructions.  No future appointments.  No orders of the defined types were placed in this encounter.     -------------------------------

## 2023-03-12 ENCOUNTER — Other Ambulatory Visit (HOSPITAL_COMMUNITY): Payer: Self-pay

## 2023-03-26 ENCOUNTER — Other Ambulatory Visit (HOSPITAL_COMMUNITY): Payer: Self-pay

## 2023-03-26 ENCOUNTER — Other Ambulatory Visit: Payer: Self-pay | Admitting: Family Medicine

## 2023-03-26 MED ORDER — METRONIDAZOLE 0.75 % VA GEL
VAGINAL | 0 refills | Status: DC
  Filled 2023-03-26: qty 70, 5d supply, fill #0

## 2023-03-26 MED ORDER — NITROFURANTOIN MONOHYD MACRO 100 MG PO CAPS
100.0000 mg | ORAL_CAPSULE | Freq: Every day | ORAL | 1 refills | Status: DC | PRN
  Filled 2023-03-26: qty 30, 30d supply, fill #0
  Filled 2023-09-29: qty 30, 30d supply, fill #1

## 2023-05-04 ENCOUNTER — Other Ambulatory Visit (HOSPITAL_COMMUNITY): Payer: Self-pay

## 2023-05-12 ENCOUNTER — Other Ambulatory Visit (HOSPITAL_COMMUNITY): Payer: Self-pay

## 2023-05-19 ENCOUNTER — Other Ambulatory Visit (HOSPITAL_COMMUNITY): Payer: Self-pay

## 2023-05-24 ENCOUNTER — Other Ambulatory Visit (HOSPITAL_COMMUNITY): Payer: Self-pay

## 2023-05-24 DIAGNOSIS — Z1231 Encounter for screening mammogram for malignant neoplasm of breast: Secondary | ICD-10-CM | POA: Diagnosis not present

## 2023-05-24 DIAGNOSIS — Z01419 Encounter for gynecological examination (general) (routine) without abnormal findings: Secondary | ICD-10-CM | POA: Diagnosis not present

## 2023-05-24 DIAGNOSIS — Z6820 Body mass index (BMI) 20.0-20.9, adult: Secondary | ICD-10-CM | POA: Diagnosis not present

## 2023-05-24 DIAGNOSIS — N760 Acute vaginitis: Secondary | ICD-10-CM | POA: Diagnosis not present

## 2023-05-24 MED ORDER — ESTRADIOL-NORETHINDRONE ACET 1-0.5 MG PO TABS
1.0000 | ORAL_TABLET | Freq: Every day | ORAL | 3 refills | Status: AC
  Filled 2023-05-24 – 2024-01-10 (×2): qty 84, 84d supply, fill #0

## 2023-05-24 MED ORDER — METRONIDAZOLE 0.75 % VA GEL
1.0000 | Freq: Every day | VAGINAL | 3 refills | Status: AC
  Filled 2023-05-24: qty 70, 5d supply, fill #0
  Filled 2023-10-12: qty 70, 5d supply, fill #1
  Filled 2024-02-02: qty 70, 5d supply, fill #2

## 2023-05-24 MED ORDER — ESTRADIOL 0.1 MG/GM VA CREA
TOPICAL_CREAM | VAGINAL | 2 refills | Status: AC
  Filled 2023-05-24: qty 42.5, 90d supply, fill #0

## 2023-05-25 ENCOUNTER — Other Ambulatory Visit (HOSPITAL_COMMUNITY): Payer: Self-pay

## 2023-05-25 ENCOUNTER — Other Ambulatory Visit: Payer: Self-pay

## 2023-05-31 ENCOUNTER — Other Ambulatory Visit (HOSPITAL_COMMUNITY): Payer: Self-pay

## 2023-06-22 ENCOUNTER — Telehealth: Payer: Self-pay

## 2023-06-22 NOTE — Telephone Encounter (Signed)
 Pharmacy Patient Advocate Encounter   Received notification from CoverMyMeds that prior authorization for Nurtec 75MG  dispersible tablets is due for renewal.   Insurance verification completed.   The patient is insured through [facility].  Action: PA required; PA submitted to above mentioned insurance via CoverMyMeds Key/confirmation #/EOC [id number] Status is pending

## 2023-06-24 ENCOUNTER — Other Ambulatory Visit (HOSPITAL_COMMUNITY): Payer: Self-pay

## 2023-06-24 NOTE — Telephone Encounter (Signed)
 Pharmacy Patient Advocate Encounter  Received notification from [facility] that Prior Authorization for Nurtec 75MG  dispersible tablets has been APPROVED from 06/23/23 to 06/21/24. Ran test claim, Copay is $0. This test claim was processed through [facility]- copay amounts may vary at other pharmacies due to pharmacy/plan contracts, or as the patient moves through the different stages of their insurance plan.   PA #/Case ID/Reference #: [id number]

## 2023-07-05 ENCOUNTER — Other Ambulatory Visit (HOSPITAL_COMMUNITY): Payer: Self-pay

## 2023-07-20 ENCOUNTER — Encounter: Admitting: Family

## 2023-07-21 ENCOUNTER — Other Ambulatory Visit: Payer: Self-pay

## 2023-07-21 ENCOUNTER — Encounter: Payer: Self-pay | Admitting: Adult Health

## 2023-07-21 ENCOUNTER — Telehealth: Admitting: Adult Health

## 2023-07-21 ENCOUNTER — Other Ambulatory Visit (HOSPITAL_COMMUNITY): Payer: Self-pay

## 2023-07-21 DIAGNOSIS — F419 Anxiety disorder, unspecified: Secondary | ICD-10-CM | POA: Diagnosis not present

## 2023-07-21 DIAGNOSIS — F331 Major depressive disorder, recurrent, moderate: Secondary | ICD-10-CM

## 2023-07-21 DIAGNOSIS — F32A Depression, unspecified: Secondary | ICD-10-CM | POA: Diagnosis not present

## 2023-07-21 DIAGNOSIS — F429 Obsessive-compulsive disorder, unspecified: Secondary | ICD-10-CM | POA: Diagnosis not present

## 2023-07-21 DIAGNOSIS — G4700 Insomnia, unspecified: Secondary | ICD-10-CM

## 2023-07-21 MED ORDER — ESZOPICLONE 2 MG PO TABS
2.0000 mg | ORAL_TABLET | Freq: Every evening | ORAL | 0 refills | Status: DC | PRN
  Filled 2023-07-21: qty 90, 90d supply, fill #0

## 2023-07-21 MED ORDER — BUPROPION HCL ER (SR) 150 MG PO TB12
150.0000 mg | ORAL_TABLET | Freq: Two times a day (BID) | ORAL | 1 refills | Status: DC
  Filled 2023-07-21: qty 180, 90d supply, fill #0

## 2023-07-21 MED ORDER — AMPHETAMINE-DEXTROAMPHET ER 5 MG PO CP24
5.0000 mg | ORAL_CAPSULE | Freq: Every day | ORAL | 0 refills | Status: AC
Start: 2023-07-21 — End: 2023-12-06
  Filled 2023-07-21: qty 90, 90d supply, fill #0

## 2023-07-21 NOTE — Progress Notes (Signed)
 Amanda Alvarez [id number] 03-28-1965 58 y.o.  Virtual Visit via Video Note  I connected with pt @ on 07/21/23 at  3:30 PM EDT by a video enabled telemedicine application and verified that I am speaking with the correct person using two identifiers.   I discussed the limitations of evaluation and management by telemedicine and the availability of in person appointments. The patient expressed understanding and agreed to proceed.  I discussed the assessment and treatment plan with the patient. The patient was provided an opportunity to ask questions and all were answered. The patient agreed with the plan and demonstrated an understanding of the instructions.   The patient was advised to call back or seek an in-person evaluation if the symptoms worsen or if the condition fails to improve as anticipated.  I provided 10 minutes of non-face-to-face time during this encounter.  The patient was located at home.  The provider was located at [facility].   Reagan Camera, NP   Subjective:   [name] is a 58 y.o. (DOB 08-12-1965) female.  Chief Complaint: No chief complaint on file.   HPI Amanda Alvarez presents for follow-up of MDD and insomnia.  Describes mood today as "ok". Pleasant. Denies tearfulness. Mood symptoms - denies depression, anxiety and irritability. Reports stable interest and motivation. Denies panic attacks. Denies worry, rumination and over thinking. Reports mood is stable. Stating "I feel like I'm doing ok". Taking medications as prescribed.  Energy levels stable. Active, has a regular exercise routine.  Enjoys some usual interests and activities. In a relationship. Has 2 children. Spending time with family. Appetite adequate. Weight stable - 120 pounds. Sleeping well most nights. Averages 6 or more hours a night. Focus and concentration stable. Completing tasks. Managing aspects of household. Work going well. Denies SI or HI.  Denies  AH or VH. Denies self harm. Denies substance use.  Previous medication trials: Provigil   Review of Systems:  Review of Systems  Musculoskeletal:  Negative for gait problem.  Neurological:  Negative for tremors.  Psychiatric/Behavioral:         Please refer to HPI    Medications: I have reviewed the patient's current medications.  Current Outpatient Medications  Medication Sig Dispense Refill   amphetamine -dextroamphetamine  (ADDERALL  XR) 5 MG 24 hr capsule Take 1 capsule (5 mg total) by mouth daily. 90 capsule 0   azithromycin  (ZITHROMAX ) 500 MG tablet Take 1 tablet 1 hour prior to surgery 1 tablet 0   benzonatate  (TESSALON ) 200 MG capsule Take 1 capsule (200 mg total) by mouth 2 (two) times daily as needed for cough. 20 capsule 0   cholecalciferol (VITAMIN D3) 25 MCG (1000 UNIT) tablet Take 1,000 Units by mouth daily.     desvenlafaxine  (PRISTIQ ) 50 MG 24 hr tablet Take 1 tablet (50 mg total) by mouth daily. 90 tablet 3   estradiol  (ESTRACE ) 0.1 MG/GM vaginal cream Place 0.1 g vaginally 2 (two) times daily at bedtime 42.5 g 2   estradiol  (ESTRACE ) 0.1 MG/GM vaginal cream INSERT BLUEBERRY SIZE AMOUNT OF CREAM INTO VAGINA AT BEDTIME TWICE/WEEK 42.5 g 2   estradiol -norethindrone  (ACTIVELLA ) 1-0.5 MG tablet Take 1 tablet by mouth daily. 90 tablet 3   estradiol -norethindrone  (ACTIVELLA ) 1-0.5 MG tablet Take 1 tablet by mouth daily. 84 tablet 3   eszopiclone  (LUNESTA ) 2 MG TABS tablet Take 1 tablet (2 mg total) by mouth at bedtime as needed for sleep. Take immediately before bedtime 30 tablet 2   methylPREDNISolone  (  MEDROL ) 4 MG TBPK tablet Take tablets as directed on package 21 each 0   metroNIDAZOLE  (METROGEL ) 0.75 % vaginal gel Place 1 Application vaginally at bedtime for 5 days. 70 g 3   nitrofurantoin , macrocrystal-monohydrate, (MACROBID ) 100 MG capsule Take 1 capsule (100 mg total) by mouth daily as needed for postcoital UTI prophylaxis. 30 capsule 1   ondansetron  (ZOFRAN ) 4 MG  tablet Take 1 tablet (4 mg total) by mouth every 8 (eight) hours as needed for nausea or vomiting. 20 tablet 2   Rimegepant Sulfate  (NURTEC) 75 MG TBDP Take 1 tablet (75 mg total) by mouth every other day. 16 tablet 11   Rimegepant Sulfate  (NURTEC) 75 MG TBDP Take 1 tablet (75 mg total) by mouth every other day. 16 tablet 11   SUMAtriptan  (IMITREX ) 50 MG tablet Take 1-2 tablets (50-100 mg total) by mouth once as needed for up to 1 dose for migraine. May repeat in 2 hours if headache persists or recurs. 20 tablet 5   traMADol  (ULTRAM ) 50 MG tablet Take 1 tablet by mouth as directed every 4 to 6 hours as needed for pain 30 tablet 0   No current facility-administered medications for this visit.    Medication Side Effects: None  Allergies:  Allergies  Allergen Reactions   Ambien  [Zolpidem  Tartrate]     Patient states this is NOT an allergy she just doesn't like taking the medication     Past Medical History:  Diagnosis Date   Cough 08/26/2014   GAD (generalized anxiety disorder)    Major depressive disorder, single episode, severe, without mention of psychotic behavior    OCD (obsessive compulsive disorder)    Urinary tract infection, site not specified    chronic since childhood. Has had multiple eval: cysto x 3 all with chronic inflammation with lymphocytis invasion with plasma cells. for suppresion    Family History  Problem Relation Age of Onset   Atrial fibrillation Mother    Hyperlipidemia Mother    Hypothyroidism Mother    Coronary artery disease Father        early 66s, nonsmoker   Hyperlipidemia Father    Colon polyps Father    Lymphoma Father        age 2- in spine but broke his back- treated and doing better   Hypertension Brother    Heart disease Other        dads side   Heart disease Other        dads side   Lung cancer Other    Cancer Other        breast   Cancer Paternal Grandfather    Colon cancer Neg Hx    Breast cancer Neg Hx     Social History    Socioeconomic History   Marital status: Married    Spouse name: Not on file   Number of children: 2   Years of education: 18   Highest education level: Not on file  Occupational History   Occupation: NURSE PRACTITIONER    Employer: [employer]    Comment: Adult nurse GI   Occupation: NURSE PRACTITIONER    Employer: [employer]  Tobacco Use   Smoking status: Never   Smokeless tobacco: Never  Substance and Sexual Activity   Alcohol use: Yes    Alcohol/week: 0.0 - 1.0 standard drinks of alcohol   Drug use: No   Sexual activity: Yes    Partners: Male  Other Topics Concern   Not on file  Social  History Narrative   Live with her 2 kids   1 son - '05; 1 daughter  - '03 (off at college in fall 2021)   Divorced    Prior Married '94 - 4 years, Divorced;remarried  '02 later divorced 2016 (ex took it well and still- good relationship with kids)      [place] BSRN, [employer]  MSN, [name]       Hobbies: tennis, time with family   Social Drivers of Corporate investment banker Strain: Not on file  Food Insecurity: Not on file  Transportation Needs: Not on file  Physical Activity: Not on file  Stress: Not on file  Social Connections: Not on file  Intimate Partner Violence: Not on file    Past Medical History, Surgical history, Social history, and Family history were reviewed and updated as appropriate.   Please see review of systems for further details on the patient's review from today.   Objective:   Physical Exam:  LMP 07/30/2015 (Approximate)   Physical Exam Constitutional:      General: She is not in acute distress. Musculoskeletal:        General: No deformity.  Neurological:     Mental Status: She is alert and oriented to person, place, and time.     Coordination: Coordination normal.  Psychiatric:        Attention and Perception: Attention and perception normal. She does not perceive auditory or visual hallucinations.        Mood and Affect: Mood  normal. Mood is not anxious or depressed. Affect is not labile, blunt, angry or inappropriate.        Speech: Speech normal.        Behavior: Behavior normal.        Thought Content: Thought content normal. Thought content is not paranoid or delusional. Thought content does not include homicidal or suicidal ideation. Thought content does not include homicidal or suicidal plan.        Cognition and Memory: Cognition and memory normal.        Judgment: Judgment normal.     Comments: Insight intact     Lab Review:     Component Value Date/Time   NA 143 11/22/2018 1553   K 4.3 11/22/2018 1553   CL 105 11/22/2018 1553   CO2 27 11/22/2018 1553   GLUCOSE 93 11/22/2018 1553   BUN 9 11/22/2018 1553   CREATININE 0.65 11/22/2018 1553   CALCIUM 9.7 11/22/2018 1553   PROT 6.8 11/22/2018 1553   ALBUMIN 4.4 11/22/2018 1553   AST 12 11/22/2018 1553   ALT 8 11/22/2018 1553   ALKPHOS 61 11/22/2018 1553   BILITOT 0.5 11/22/2018 1553   GFRNONAA >60 08/27/2014 2240   GFRAA >60 08/27/2014 2240       Component Value Date/Time   WBC 5.1 11/22/2018 1553   RBC 4.27 11/22/2018 1553   HGB 13.4 11/22/2018 1553   HCT 39.9 11/22/2018 1553   PLT 301.0 11/22/2018 1553   MCV 93.5 11/22/2018 1553   MCH 28.8 08/27/2014 2240   MCHC 33.5 11/22/2018 1553   RDW 12.8 11/22/2018 1553   LYMPHSABS 2.0 08/13/2015 1027   MONOABS 0.4 08/13/2015 1027   EOSABS 0.1 08/13/2015 1027   BASOSABS 0.0 08/13/2015 1027    No results found for: "POCLITH", "LITHIUM"   No results found for: "PHENYTOIN", "PHENOBARB", "VALPROATE", "CBMZ"   .res Assessment: Plan:    Plan:  PDMP reviewed  Adderall  XR 5mg  capsule daily Lunesta  2mg   at hs Wellbutrin  SR 150mg  BID  BP WNL per patient  RTC 6 months  10 minutes spent dedicated to the care of this patient on the date of this encounter to include pre-visit review of records, ordering of medication, post visit documentation, and face-to-face time with the patient discussing  depression, anxiety and OCD. Discussed continuing current medication regimen.  Patient advised to contact office with any questions, adverse effects, or acute worsening in signs and symptoms.  Discussed potential benefits, risks, and side effects of stimulants with patient to include increased heart rate, palpitations, insomnia, increased anxiety, increased irritability, or decreased appetite.  Instructed patient to contact office if experiencing any significant tolerability issues.   There are no diagnoses linked to this encounter.   Please see After Visit Summary for patient specific instructions.  Future Appointments  Date Time Provider Department Center  07/21/2023  3:30 PM Fiana Gladu, Ursula Gardner, NP [facility] None  12/06/2023  8:00 AM Almira Jaeger, MD [facility]    No orders of the defined types were placed in this encounter.     -------------------------------

## 2023-08-16 ENCOUNTER — Encounter: Admitting: Family Medicine

## 2023-08-24 ENCOUNTER — Telehealth: Admitting: Nurse Practitioner

## 2023-08-24 ENCOUNTER — Other Ambulatory Visit (HOSPITAL_COMMUNITY): Payer: Self-pay

## 2023-08-24 DIAGNOSIS — R3989 Other symptoms and signs involving the genitourinary system: Secondary | ICD-10-CM | POA: Diagnosis not present

## 2023-08-24 MED ORDER — CEPHALEXIN 500 MG PO CAPS
500.0000 mg | ORAL_CAPSULE | Freq: Two times a day (BID) | ORAL | 0 refills | Status: AC
  Filled 2023-08-24: qty 14, 7d supply, fill #0

## 2023-08-24 MED ORDER — NITROFURANTOIN MONOHYD MACRO 100 MG PO CAPS
100.0000 mg | ORAL_CAPSULE | Freq: Two times a day (BID) | ORAL | 0 refills | Status: AC
  Filled 2023-08-24: qty 10, 5d supply, fill #0

## 2023-08-24 NOTE — Progress Notes (Signed)
 E-Visit for Urinary Problems  We are sorry that you are not feeling well.  Here is how we plan to help!  Based on what you shared with me it looks like you most likely have a simple urinary tract infection.  A UTI (Urinary Tract Infection) is a bacterial infection of the bladder.  Most cases of urinary tract infections are simple to treat but a key part of your care is to encourage you to drink plenty of fluids and watch your symptoms carefully.  I have prescribed MacroBid 100 mg twice a day for 5 days.  Your symptoms should gradually improve. Call us  if the burning in your urine worsens, you develop worsening fever, back pain or pelvic pain or if your symptoms do not resolve after completing the antibiotic.  Urinary tract infections can be prevented by drinking plenty of water to keep your body hydrated.  Also be sure when you wipe, wipe from front to back and don't hold it in!  If possible, empty your bladder every 4 hours.  HOME CARE Drink plenty of fluids Compete the full course of the antibiotics even if the symptoms resolve Remember, when you need to go.go. Holding in your urine can increase the likelihood of getting a UTI! GET HELP RIGHT AWAY IF: You cannot urinate You get a high fever Worsening back pain occurs You see blood in your urine You feel sick to your stomach or throw up You feel like you are going to pass out  MAKE SURE YOU  Understand these instructions. Will watch your condition. Will get help right away if you are not doing well or get worse.   Thank you for choosing an e-visit.  Your e-visit answers were reviewed by a board certified advanced clinical practitioner to complete your personal care plan. Depending upon the condition, your plan could have included both over the counter or prescription medications.  Please review your pharmacy choice. Make sure the pharmacy is open so you can pick up prescription now. If there is a problem, you may contact your  provider through [employer] and have the prescription routed to another pharmacy.  Your safety is important to us . If you have drug allergies check your prescription carefully.   For the next 24 hours you can use MyChart to ask questions about today's visit, request a non-urgent call back, or ask for a work or school excuse. You will get an email in the next two days asking about your experience. I hope that your e-visit has been valuable and will speed your recovery.  I spent approximately 5 minutes reviewing the patient's history, current symptoms and coordinating their care today.

## 2023-08-24 NOTE — Addendum Note (Signed)
 Addended by: KENNYTH DOMINO E on: 08/24/2023 12:53 PM   Modules accepted: Orders

## 2023-09-29 ENCOUNTER — Other Ambulatory Visit (HOSPITAL_COMMUNITY): Payer: Self-pay

## 2023-09-30 ENCOUNTER — Ambulatory Visit: Admitting: Family Medicine

## 2023-09-30 ENCOUNTER — Other Ambulatory Visit (HOSPITAL_COMMUNITY): Payer: Self-pay

## 2023-09-30 ENCOUNTER — Encounter: Payer: Self-pay | Admitting: Family Medicine

## 2023-09-30 ENCOUNTER — Ambulatory Visit: Payer: Self-pay

## 2023-09-30 VITALS — BP 102/80 | HR 84 | Temp 98.0°F | Ht 64.5 in | Wt 115.6 lb

## 2023-09-30 DIAGNOSIS — R350 Frequency of micturition: Secondary | ICD-10-CM

## 2023-09-30 DIAGNOSIS — G43009 Migraine without aura, not intractable, without status migrainosus: Secondary | ICD-10-CM | POA: Diagnosis not present

## 2023-09-30 DIAGNOSIS — G43909 Migraine, unspecified, not intractable, without status migrainosus: Secondary | ICD-10-CM | POA: Insufficient documentation

## 2023-09-30 LAB — POC URINALSYSI DIPSTICK (AUTOMATED)
Bilirubin, UA: POSITIVE
Blood, UA: NEGATIVE
Glucose, UA: POSITIVE — AB
Ketones, UA: POSITIVE
Nitrite, UA: POSITIVE
Protein, UA: POSITIVE — AB
Spec Grav, UA: 1.02 (ref 1.010–1.025)
Urobilinogen, UA: >=8 U/dL — AB
pH, UA: 5 (ref 5.0–8.0)

## 2023-09-30 MED ORDER — CEPHALEXIN 500 MG PO CAPS
500.0000 mg | ORAL_CAPSULE | Freq: Four times a day (QID) | ORAL | 0 refills | Status: AC
  Filled 2023-09-30: qty 28, 7d supply, fill #0

## 2023-09-30 MED ORDER — SUMATRIPTAN SUCCINATE 50 MG PO TABS
50.0000 mg | ORAL_TABLET | Freq: Once | ORAL | 5 refills | Status: DC | PRN
  Filled 2023-09-30: qty 20, 34d supply, fill #0
  Filled 2023-11-15: qty 20, 34d supply, fill #1

## 2023-09-30 MED ORDER — ONDANSETRON 4 MG PO TBDP
4.0000 mg | ORAL_TABLET | Freq: Three times a day (TID) | ORAL | 5 refills | Status: DC | PRN
  Filled 2023-09-30: qty 20, 7d supply, fill #0

## 2023-09-30 MED ORDER — TOPIRAMATE 25 MG PO TABS
25.0000 mg | ORAL_TABLET | Freq: Every day | ORAL | 5 refills | Status: DC
  Filled 2023-09-30: qty 30, 30d supply, fill #0
  Filled 2023-11-01: qty 30, 30d supply, fill #1

## 2023-09-30 NOTE — Assessment & Plan Note (Signed)
 S:Long-standing history of headaches/migraines. No aura - Propranolol  initially effective but later lost efficacy - Nurtec provided moderate relief - Imitrex  50 mg effective for acute headache episodes but having 4 a month and wants to see if can cut down on frequency   A/P: Migraine with episodic headaches occurring approximately four times per month, previously managed with propranolol  for prophylaxis and prn ( Nurtec and imitrex ). Propranolol  became ineffective plus felt drowsy so har to increase dose, and Nurtec provided moderate relief but concerned about cost- had to use voucher. Imitrex  effective as a rescue medication. Considering alternative maintenance therapies due to insurance coverage concerns and side effects of previous treatments. Topamax  chosen for its long-standing market presence and tolerability at low doses. - Prescribe Topamax  25 mg once daily for migraine prevention. - Refill sumatriptan  for acute migraine attacks. - Refill ondansetron  oral dissolvable tablets for nausea associated with migraines. - Discuss potential interaction of Topamax  with estradiol /norethindrone  with GYN.

## 2023-09-30 NOTE — Telephone Encounter (Signed)
 FYI Only or Action Required?: FYI only for provider.  Patient was last seen in primary care on 11/27/2022 by Almeda Loa ORN, FNP.  Called Nurse Triage reporting Urinary Frequency. Pt also has a HA.   Symptoms began several days ago.  Interventions attempted: Rest, hydration, or home remedies.  Symptoms are: unchanged.  Triage Disposition: See Physician Within 24 Hours  Patient/caregiver understands and will follow disposition?: Yes, will follow disposition  Copied from CRM #[id number]. Topic: Clinical - Red Word Triage >> Sep 30, 2023  8:57 AM [place] A wrote: [facility] that prompted transfer to Nurse Triage: Patient is having headaches 1-10 is 4. Possible UTI frequency and burning. Reason for Disposition  Urinating more frequently than usual (i.e., frequency) OR new-onset of the feeling of an urgent need to urinate (i.e., urgency)  Answer Assessment - Initial Assessment Questions 1. SYMPTOM: What's the main symptom you're concerned about? (e.g., frequency, incontinence)     frequency 2. ONSET: When did the  frequency  start?     About 2 weeks ago 3. PAIN: Is there any pain? If Yes, ask: How bad is it? (Scale: 1-10; mild, moderate, severe)     4/10 HA, 5/10 pain with burning 4. CAUSE: What do you think is causing the symptoms?     UTI, 5. OTHER SYMPTOMS: Do you have any other symptoms? (e.g., blood in urine, fever, flank pain, pain with urination)     Denies  Pt states that she is also having a HA, denies vision changes. States HA is 4/10 pain, pt scheduled for today at 1000.  Protocols used: Urinary Symptoms-A-AH

## 2023-09-30 NOTE — Telephone Encounter (Signed)
 Noted. Patient seeing Dr. Katrinka today 09/30/2023.

## 2023-09-30 NOTE — Patient Instructions (Addendum)
 During your visit, we addressed your ongoing urinary tract infection (UTI) symptoms and discussed your headache management. We have adjusted your medications to better manage these conditions.  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 Previous treatment with Keflex  was not fully effective with dosing only twice a day which may have been inadequate. -Start taking Keflex  500 mg four times daily for seven days. -A urine culture has been ordered to confirm the infection and guide further treatment. i would likely finish course even if negative culture due to use of nitrofurantoin  -If your symptoms persist despite a negative culture, we may consider a referral to a urologist.  MIGRAINE WITH EPISODIC HEADACHES AND ASSOCIATED NAUSEA: You have been experiencing migraines about four times a month. Previous medications provided moderate relief, but we are considering new options due to insurance and side effects. -Start taking Topamax  25 mg once daily for migraine prevention. -Continue using sumatriptan  for acute migraine attacks. refilled today -Continue using ondansetron  oral dissolvable tablets for nausea associated with migraines. -We will discuss the potential interaction of Topamax  with your current hormone therapy with your gynecologist. -can refer to neurology if we are not able to make progress- but we could look at higher dose topamax  first  Recommended follow up: Return for next already scheduled visit or sooner if needed, as needed for new, worsening, persistent symptoms.

## 2023-09-30 NOTE — Progress Notes (Signed)
 Phone [phone] In person visit   Subjective:   Amanda Alvarez is a 58 y.o. year old very pleasant female patient who presents for/with See problem oriented charting Chief Complaint  Patient presents with   Urinary Frequency    Pt currently taking Pyridium ;     Discussed the use of AI scribe software for clinical note transcription with the patient, who gave verbal consent to proceed.  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 90 capsule 0   buPROPion  (WELLBUTRIN  SR) 150 MG 12 hr tablet Take 1 tablet (150 mg total) by mouth 2 (two) times daily. 180 tablet 1   cephALEXin  (KEFLEX ) 500 MG capsule Take 1 capsule (500 mg total) by mouth 4 (four) times daily for 7 days. 28 capsule 0   cholecalciferol (VITAMIN D3) 25 MCG (1000 UNIT) tablet Take 1,000 Units by mouth daily.     estradiol  (ESTRACE ) 0.1 MG/GM vaginal cream INSERT BLUEBERRY SIZE AMOUNT OF CREAM INTO VAGINA AT BEDTIME TWICE/WEEK 42.5 g 2   estradiol -norethindrone  (ACTIVELLA ) 1-0.5 MG tablet Take 1 tablet by mouth daily. 84 tablet 3   eszopiclone  (LUNESTA ) 2 MG TABS tablet Take 1 tablet (2 mg total) by mouth at bedtime as needed for sleep. Take immediately before bedtime 90 tablet 0   metroNIDAZOLE  (METROGEL )  0.75 % vaginal gel Place 1 Application vaginally at bedtime for 5 days. 70 g 3   nitrofurantoin , macrocrystal-monohydrate, (MACROBID ) 100 MG capsule Take 1 capsule (100 mg total) by mouth daily as needed for postcoital UTI prophylaxis. 30 capsule 1   ondansetron  (ZOFRAN -ODT) 4 MG disintegrating tablet Dissolve 1 tablet (4 mg total) in mouth every 8 (eight) hours as needed for nausea or vomiting. 20 tablet 5   topiramate  (TOPAMAX ) 25 MG tablet Take 1 tablet (25 mg total) by mouth daily. 30 tablet 5   SUMAtriptan  (IMITREX ) 50 MG tablet Take 1-2 tablets (50-100 mg total) by mouth as needed for migraine. May repeat in 2 hours if headache persists or recurs. 20 tablet 5   No current facility-administered medications for this visit.     Objective:  BP 102/80 (BP Location: Left Arm, Patient Position: Sitting, Cuff Size: Normal)   Pulse 84   Temp 98 F (36.7 C) (Temporal)   Ht 5' 4.5 (1.638 m)   Wt 115 lb 9.6 oz (52.4 kg)   LMP 07/30/2015 (Approximate)   SpO2 95%   BMI 19.54 kg/m  Gen: NAD, resting comfortably CV: RRR no murmurs rubs or gallops Lungs: CTAB no crackles, wheeze, rhonchi Abdomen: No suprapubic or CVA tenderness Ext: no edema Skin: warm, dry     Assessment and Plan    #Concern for UTI S: Patients symptoms started over  a month ago.  - Previous treatment with Keflex  twice daily through e visit about a month ago-  provided partial relief but did not fully resolve symptoms  -since stopping treatment symptoms gradually worsened - Severe dysuria, urinary frequency, and significant pain persistent and worsening gradually over the past month- last week has been tough - No fever, chills, nausea, vomiting, flank pain, or hematuria - Urinalysis showed leukocytes, positive protein, and glucose - history of  Chronic urinary tract infections managed with post-coital nitrofurantoin  (macrodantin ) prophylaxis. she has been using this - she thinks holding bladder during long work shifts  may have contributed to recent UTI though A/P:   Recurrent urinary tract infection in patient with chronic cystitis, presenting with dysuria, frequency, and significant pain. Previous treatment with Keflex  was possibly inadequate due to suboptimal dosing and short half life. Chronic inflammation of the bladder noted on biopsies over a 15-year span, ruling out interstitial cystitis. Pyridium  use noted, causing urine discoloration and affecting urinalysis results. Empirical treatment deemed appropriate given her history and symptomatology. - Prescribe Keflex  500 mg four times daily for seven days.take at minimum at least 3x a day- 4 if possible - Order urine culture to confirm infection and guide further treatment. though did take a nitrofurantoin  yesterday so could skew results- likely to complete regimen regardless unless resistance noted - Consider urology consult if symptoms persist despite negative culture results.  # Migraines S:Long-standing history of headaches/migraines. No aura - Propranolol  initially effective but later lost efficacy - Nurtec provided moderate relief - Imitrex  50 mg effective for acute headache episodes but having 4 a month and wants to see if can cut down on frequency   A/P: Migraine with episodic headaches occurring approximately four times per month, previously managed with propranolol  for prophylaxis and prn ( Nurtec and imitrex ). Propranolol  became ineffective plus felt drowsy so har to increase dose, and Nurtec provided moderate relief but concerned about cost- had to use voucher. Imitrex  effective as a rescue medication. Considering alternative maintenance therapies due to insurance coverage concerns and side effects of previous treatments. Topamax  chosen for its long-standing market presence and tolerability at low doses. - Prescribe Topamax  25 mg once daily for migraine prevention. - Refill sumatriptan  for acute migraine attacks. - Refill ondansetron  oral  dissolvable tablets for nausea associated with migraines. - Discuss potential interaction of Topamax  with estradiol /norethindrone  with GYN.     Recommended follow up: Return for next already scheduled visit or sooner if needed, as needed for new, worsening, persistent symptoms. Future Appointments  Date Time Provider Department Center  12/06/2023  8:00 AM Katrinka Garnette KIDD, MD [facility]    Lab/Order associations:   ICD-10-CM   1. Urinary frequency  R35.0 POCT Urinalysis Dipstick (Automated)    Urine Culture    Urine Culture    CANCELED: Urine Culture    2. Migraine without aura and without status migrainosus, not intractable  G43.009       Meds ordered this encounter  Medications   cephALEXin  (KEFLEX ) 500 MG capsule    Sig: Take 1 capsule (500 mg total) by mouth 4 (four) times daily for 7 days.    Dispense:  28 capsule    Refill:  0   topiramate  (TOPAMAX ) 25 MG tablet    Sig: Take 1 tablet (25 mg total) by mouth daily.    Dispense:  30 tablet    Refill:  5   ondansetron  (ZOFRAN -ODT) 4 MG disintegrating tablet    Sig: Dissolve 1 tablet (4  mg total) in mouth every 8 (eight) hours as needed for nausea or vomiting.    Dispense:  20 tablet    Refill:  5   SUMAtriptan  (IMITREX ) 50 MG tablet    Sig: Take 1-2 tablets (50-100 mg total) by mouth as needed for migraine. May repeat in 2 hours if headache persists or recurs.    Dispense:  20 tablet    Refill:  5    Return precautions advised.  Garnette Lukes, MD

## 2023-10-01 LAB — URINE CULTURE
MICRO NUMBER:: 16832154
Result:: NO GROWTH
SPECIMEN QUALITY:: ADEQUATE

## 2023-10-04 ENCOUNTER — Ambulatory Visit: Payer: Self-pay | Admitting: Family Medicine

## 2023-10-11 ENCOUNTER — Other Ambulatory Visit (HOSPITAL_COMMUNITY): Payer: Self-pay

## 2023-10-11 DIAGNOSIS — R300 Dysuria: Secondary | ICD-10-CM | POA: Diagnosis not present

## 2023-10-11 MED ORDER — SULFAMETHOXAZOLE-TRIMETHOPRIM 800-160 MG PO TABS
1.0000 | ORAL_TABLET | Freq: Two times a day (BID) | ORAL | 0 refills | Status: DC
  Filled 2023-10-11: qty 20, 10d supply, fill #0

## 2023-10-12 ENCOUNTER — Other Ambulatory Visit (HOSPITAL_COMMUNITY): Payer: Self-pay

## 2023-10-28 ENCOUNTER — Other Ambulatory Visit (HOSPITAL_COMMUNITY): Payer: Self-pay

## 2023-10-28 DIAGNOSIS — R350 Frequency of micturition: Secondary | ICD-10-CM | POA: Diagnosis not present

## 2023-10-28 DIAGNOSIS — N3020 Other chronic cystitis without hematuria: Secondary | ICD-10-CM | POA: Diagnosis not present

## 2023-10-28 DIAGNOSIS — R300 Dysuria: Secondary | ICD-10-CM | POA: Diagnosis not present

## 2023-10-28 MED ORDER — TRIMETHOPRIM 100 MG PO TABS
100.0000 mg | ORAL_TABLET | Freq: Every day | ORAL | 3 refills | Status: AC
  Filled 2023-10-28: qty 90, 90d supply, fill #0

## 2023-11-01 ENCOUNTER — Other Ambulatory Visit (HOSPITAL_COMMUNITY): Payer: Self-pay

## 2023-11-15 ENCOUNTER — Other Ambulatory Visit (HOSPITAL_COMMUNITY): Payer: Self-pay

## 2023-12-06 ENCOUNTER — Other Ambulatory Visit (HOSPITAL_COMMUNITY): Payer: Self-pay

## 2023-12-06 ENCOUNTER — Encounter: Payer: Self-pay | Admitting: Family Medicine

## 2023-12-06 ENCOUNTER — Encounter: Payer: Self-pay | Admitting: Adult Health

## 2023-12-06 ENCOUNTER — Ambulatory Visit: Payer: Self-pay | Admitting: Family Medicine

## 2023-12-06 ENCOUNTER — Ambulatory Visit (INDEPENDENT_AMBULATORY_CARE_PROVIDER_SITE_OTHER): Admitting: Family Medicine

## 2023-12-06 ENCOUNTER — Telehealth (INDEPENDENT_AMBULATORY_CARE_PROVIDER_SITE_OTHER): Admitting: Adult Health

## 2023-12-06 VITALS — BP 102/70 | HR 87 | Temp 98.4°F | Ht 64.5 in | Wt 118.0 lb

## 2023-12-06 DIAGNOSIS — Z13220 Encounter for screening for lipoid disorders: Secondary | ICD-10-CM | POA: Diagnosis not present

## 2023-12-06 DIAGNOSIS — Z1321 Encounter for screening for nutritional disorder: Secondary | ICD-10-CM

## 2023-12-06 DIAGNOSIS — G4700 Insomnia, unspecified: Secondary | ICD-10-CM | POA: Diagnosis not present

## 2023-12-06 DIAGNOSIS — Z0000 Encounter for general adult medical examination without abnormal findings: Secondary | ICD-10-CM | POA: Diagnosis not present

## 2023-12-06 DIAGNOSIS — Z130 Encounter for screening for diseases of the blood and blood-forming organs and certain disorders involving the immune mechanism: Secondary | ICD-10-CM

## 2023-12-06 DIAGNOSIS — Z23 Encounter for immunization: Secondary | ICD-10-CM

## 2023-12-06 DIAGNOSIS — F331 Major depressive disorder, recurrent, moderate: Secondary | ICD-10-CM | POA: Diagnosis not present

## 2023-12-06 LAB — LIPID PANEL
Cholesterol: 177 mg/dL (ref 0–200)
HDL: 65.7 mg/dL (ref >39.00)
LDL Cholesterol: 99 mg/dL (ref 0–99)
NonHDL: 111.39
Total CHOL/HDL Ratio: 3
Triglycerides: 62 mg/dL (ref 0.0–149.0)
VLDL: 12.4 mg/dL (ref 0.0–40.0)

## 2023-12-06 LAB — VITAMIN D 25 HYDROXY (VIT D DEFICIENCY, FRACTURES): VITD: 62.5 ng/mL (ref 30.00–100.00)

## 2023-12-06 MED ORDER — TOPIRAMATE 25 MG PO TABS
25.0000 mg | ORAL_TABLET | Freq: Every day | ORAL | 3 refills | Status: DC
  Filled 2023-12-06: qty 90, 90d supply, fill #0

## 2023-12-06 MED ORDER — TRAZODONE HCL 50 MG PO TABS
50.0000 mg | ORAL_TABLET | Freq: Every day | ORAL | 3 refills | Status: AC
  Filled 2023-12-06: qty 90, 90d supply, fill #0

## 2023-12-06 MED ORDER — SUMATRIPTAN SUCCINATE 50 MG PO TABS
50.0000 mg | ORAL_TABLET | Freq: Once | ORAL | 3 refills | Status: AC | PRN
Start: 2023-12-06 — End: ?
  Filled 2023-12-06: qty 9, 30d supply, fill #0
  Filled 2023-12-06 – 2024-01-10 (×2): qty 30, 51d supply, fill #0
  Filled 2024-01-10: qty 20, 34d supply, fill #0
  Filled 2024-02-02: qty 18, 31d supply, fill #0

## 2023-12-06 MED ORDER — BUPROPION HCL ER (SR) 150 MG PO TB12
150.0000 mg | ORAL_TABLET | Freq: Every day | ORAL | 3 refills | Status: AC
  Filled 2023-12-06 – 2024-03-01 (×2): qty 90, 90d supply, fill #0

## 2023-12-06 MED ORDER — ONDANSETRON 4 MG PO TBDP
4.0000 mg | ORAL_TABLET | Freq: Three times a day (TID) | ORAL | 3 refills | Status: AC | PRN
  Filled 2023-12-06: qty 30, 10d supply, fill #0

## 2023-12-06 NOTE — Progress Notes (Signed)
 Amanda Alvarez [id number] [birth year] 58 y.o.  Virtual Visit via Video Note  I connected with pt @ on 12/06/23 at  2:30 PM EDT by a video enabled telemedicine application and verified that I am speaking with the correct person using two identifiers.   I discussed the limitations of evaluation and management by telemedicine and the availability of in person appointments. The patient expressed understanding and agreed to proceed.  I discussed the assessment and treatment plan with the patient. The patient was provided an opportunity to ask questions and all were answered. The patient agreed with the plan and demonstrated an understanding of the instructions.   The patient was advised to call back or seek an in-person evaluation if the symptoms worsen or if the condition fails to improve as anticipated.  I provided 10 minutes of non-face-to-face time during this encounter.  The patient was located at home.  The provider was located at [facility].   Angeline LOISE Sayers, NP   Subjective:   [name] is a 58 y.o. (DOB Sep 25, 1965) female.  Chief Complaint: No chief complaint on file.   HPI Amanda Alvarez presents for follow-up of MDD and insomnia.  Describes mood today as ok. Pleasant. Denies tearfulness. Mood symptoms - denies depression, anxiety and irritability. Reports stable interest and motivation. Denies panic attacks. Denies worry, rumination and over thinking. Reports mood is stable. Stating I feel like I'm doing ok. Taking medications as prescribed.  Energy levels stable. Active, has a regular exercise routine.  Enjoys some usual interests and activities. In a relationship. Has 2 children. Spending time with family. Appetite adequate. Weight stable - 117 pounds. Reports sleeping difficulties. Averages 3 or more hours a night. Focus and concentration stable. Completing tasks. Managing aspects of household. Work going well. Denies SI or HI.   Denies AH or VH. Denies self harm. Denies substance use.  Previous medication trials: Provigil   Review of Systems:  Review of Systems  Musculoskeletal:  Negative for gait problem.  Neurological:  Negative for tremors.  Psychiatric/Behavioral:         Please refer to HPI    Medications: I have reviewed the patient's current medications.  Current Outpatient Medications  Medication Sig Dispense Refill   amphetamine -dextroamphetamine  (ADDERALL  XR) 5 MG 24 hr capsule Take 1 capsule (5 mg total) by mouth daily. 90 capsule 0   buPROPion  (WELLBUTRIN  SR) 150 MG 12 hr tablet Take 1 tablet (150 mg total) by mouth 2 (two) times daily. 180 tablet 1   cholecalciferol (VITAMIN D3) 25 MCG (1000 UNIT) tablet Take 1,000 Units by mouth daily.     estradiol  (ESTRACE ) 0.1 MG/GM vaginal cream INSERT BLUEBERRY SIZE AMOUNT OF CREAM INTO VAGINA AT BEDTIME TWICE/WEEK 42.5 g 2   estradiol -norethindrone  (ACTIVELLA ) 1-0.5 MG tablet Take 1 tablet by mouth daily. 84 tablet 3   eszopiclone  (LUNESTA ) 2 MG TABS tablet Take 1 tablet (2 mg total) by mouth at bedtime as needed for sleep. Take immediately before bedtime 90 tablet 0   metroNIDAZOLE  (METROGEL ) 0.75 % vaginal gel Place 1 Application vaginally at bedtime for 5 days. 70 g 3   ondansetron  (ZOFRAN -ODT) 4 MG disintegrating tablet Dissolve 1 tablet (4 mg total) in mouth every 8 (eight) hours as needed for nausea or vomiting. 30 tablet 3   sulfamethoxazole -trimethoprim  (BACTRIM  DS) 800-160 MG tablet Take 1 tablet by mouth in the morning and evening for 10 days. 20 tablet 0   SUMAtriptan  (IMITREX ) 50 MG tablet Take  1-2 tablets (50-100 mg total) by mouth as needed for migraine. May repeat in 2 hours if headache persists or recurs. 30 tablet 3   topiramate  (TOPAMAX ) 25 MG tablet Take 1 tablet (25 mg total) by mouth daily. 90 tablet 3   trimethoprim  (TRIMPEX ) 100 MG tablet Take 1 tablet (100 mg total) by mouth daily. 90 tablet 3   No current facility-administered  medications for this visit.    Medication Side Effects: None  Allergies:  Allergies  Allergen Reactions   Ambien  [Zolpidem  Tartrate]     Patient states this is NOT an allergy she just doesn't like taking the medication     Past Medical History:  Diagnosis Date   Cough 08/26/2014   GAD (generalized anxiety disorder)    Major depressive disorder, single episode, severe, without mention of psychotic behavior    OCD (obsessive compulsive disorder)    Urinary tract infection, site not specified    chronic since childhood. Has had multiple eval: cysto x 3 all with chronic inflammation with lymphocytis invasion with plasma cells. for suppresion    Family History  Problem Relation Age of Onset   Atrial fibrillation Mother    Hyperlipidemia Mother    Hypothyroidism Mother    Coronary artery disease Father        early 63s, nonsmoker   Hyperlipidemia Father    Colon polyps Father    Lymphoma Father        age 43- in spine but broke his back- treated and doing better   Hypertension Brother    Heart disease Other        dads side   Heart disease Other        dads side   Lung cancer Other    Cancer Other        breast   Cancer Paternal Grandfather    Colon cancer Neg Hx    Breast cancer Neg Hx     Social History   Socioeconomic History   Marital status: Married    Spouse name: Not on file   Number of children: 2   Years of education: 18   Highest education level: Not on file  Occupational History   Occupation: NURSE PRACTITIONER    Employer: [employer]    Comment: Adult nurse GI   Occupation: NURSE PRACTITIONER    Employer: [employer]  Tobacco Use   Smoking status: Never   Smokeless tobacco: Never  Substance and Sexual Activity   Alcohol use: Yes    Alcohol/week: 0.0 - 1.0 standard drinks of alcohol   Drug use: No   Sexual activity: Yes    Partners: Male  Other Topics Concern   Not on file  Social History Narrative   Live with her 2 kids   1 son - '05;  1 daughter  - '03 (off at college in fall 2021)   Divorced    Prior Married '94 - 4 years, Divorced;remarried  '02 later divorced 2016 (ex took it well and still- good relationship with kids)      [place] BSRN, [employer]  MSN, [name]       Hobbies: tennis, time with family   Social Drivers of Corporate investment banker Strain: Not on file  Food Insecurity: Not on file  Transportation Needs: Not on file  Physical Activity: Not on file  Stress: Not on file  Social Connections: Not on file  Intimate Partner Violence: Not on file    Past Medical History, Surgical  history, Social history, and Family history were reviewed and updated as appropriate.   Please see review of systems for further details on the patient's review from today.   Objective:   Physical Exam:  LMP 07/30/2015 (Approximate)   Physical Exam Constitutional:      General: She is not in acute distress. Musculoskeletal:        General: No deformity.  Neurological:     Mental Status: She is alert and oriented to person, place, and time.     Coordination: Coordination normal.  Psychiatric:        Attention and Perception: Attention and perception normal. She does not perceive auditory or visual hallucinations.        Mood and Affect: Mood normal. Mood is not anxious or depressed. Affect is not labile, blunt, angry or inappropriate.        Speech: Speech normal.        Behavior: Behavior normal.        Thought Content: Thought content normal. Thought content is not paranoid or delusional. Thought content does not include homicidal or suicidal ideation. Thought content does not include homicidal or suicidal plan.        Cognition and Memory: Cognition and memory normal.        Judgment: Judgment normal.     Comments: Insight intact     Lab Review:     Component Value Date/Time   NA 143 11/22/2018 1553   K 4.3 11/22/2018 1553   CL 105 11/22/2018 1553   CO2 27 11/22/2018 1553   GLUCOSE 93 11/22/2018  1553   BUN 9 11/22/2018 1553   CREATININE 0.65 11/22/2018 1553   CALCIUM 9.7 11/22/2018 1553   PROT 6.8 11/22/2018 1553   ALBUMIN 4.4 11/22/2018 1553   AST 12 11/22/2018 1553   ALT 8 11/22/2018 1553   ALKPHOS 61 11/22/2018 1553   BILITOT 0.5 11/22/2018 1553   GFRNONAA >60 08/27/2014 2240   GFRAA >60 08/27/2014 2240       Component Value Date/Time   WBC 5.1 11/22/2018 1553   RBC 4.27 11/22/2018 1553   HGB 13.4 11/22/2018 1553   HCT 39.9 11/22/2018 1553   PLT 301.0 11/22/2018 1553   MCV 93.5 11/22/2018 1553   MCH 28.8 08/27/2014 2240   MCHC 33.5 11/22/2018 1553   RDW 12.8 11/22/2018 1553   LYMPHSABS 2.0 08/13/2015 1027   MONOABS 0.4 08/13/2015 1027   EOSABS 0.1 08/13/2015 1027   BASOSABS 0.0 08/13/2015 1027    No results found for: POCLITH, LITHIUM   No results found for: PHENYTOIN, PHENOBARB, VALPROATE, CBMZ   .res Assessment: Plan:   Plan:  PDMP reviewed  Adderall  XR 5mg  capsule daily - not taking every day Wellbutrin  SR 150mg  BID - currently taking once a day in the morning  Add Trazadone 50mg  at hs D/C Lunesta  2mg  at hs  BP WNL per patient  RTC 6 months  10 minutes spent dedicated to the care of this patient on the date of this encounter to include pre-visit review of records, ordering of medication, post visit documentation, and face-to-face time with the patient discussing MDD and insomnia Discussed adding a low dose of Trazadone and discontinuing Lunesta . Patient to call with update.  Patient advised to contact office with any questions, adverse effects, or acute worsening in signs and symptoms.  Discussed potential benefits, risks, and side effects of stimulants with patient to include increased heart rate, palpitations, insomnia, increased anxiety, increased irritability, or decreased appetite.  Instructed patient to contact office if experiencing any significant tolerability issues. There are no diagnoses linked to this encounter.   Please  see After Visit Summary for patient specific instructions.  Future Appointments  Date Time Provider Department Center  12/06/2023  2:30 PM Jahira Swiss Nattalie, NP [facility] None    No orders of the defined types were placed in this encounter.     -------------------------------

## 2023-12-06 NOTE — Patient Instructions (Addendum)
 Please stop by lab before you go If you have mychart- we will send your results within 3 business days of us  receiving them.  If you do not have mychart- we will call you about results within 5 business days of us  receiving them.  *please also note that you will see labs on mychart as soon as they post. I will later go in and write notes on them- will say notes from Dr. Katrinka   If migraines increase at all or you want to try higher dose- just let me know- don't need separate visit  Recommended follow up: Return in about 1 year (around 12/05/2024) for physical or sooner if needed.Schedule b4 you leave.

## 2023-12-06 NOTE — Progress Notes (Signed)
 Phone [phone]   Subjective:  Patient presents today for their annual physical. Chief complaint-noted.   See problem oriented charting- ROS- full  review of systems was completed and negative except for topics noted under acute/chronic concerns  The following were reviewed and entered/updated in epic: Past Medical History:  Diagnosis Date   Cough 08/26/2014   GAD (generalized anxiety disorder)    Major depressive disorder, single episode, severe, without mention of psychotic behavior    OCD (obsessive compulsive disorder)    Urinary tract infection, site not specified    chronic since childhood. Has had multiple eval: cysto x 3 all with chronic inflammation with lymphocytis invasion with plasma cells. 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    Priority: Low   Constipation 08/13/2015    Priority: Low   Anemia 08/13/2015   Past Surgical History:  Procedure Laterality Date   CYSTOSCOPY     with uretheral stretches    Family History  Problem Relation Age of Onset   Atrial fibrillation Mother    Hyperlipidemia Mother    Hypothyroidism Mother    Coronary artery disease Father        early 68s, nonsmoker   Hyperlipidemia Father    Colon polyps Father    Lymphoma Father        age 31- in spine but broke his back- treated and doing better   Hypertension Brother    Heart disease Other        dads side   Heart disease Other        dads side   Lung cancer Other    Cancer Other        breast   Cancer Paternal Grandfather    Colon cancer Neg Hx     Breast cancer Neg Hx     Medications- reviewed and updated Current Outpatient Medications  Medication Sig Dispense Refill   amphetamine -dextroamphetamine  (ADDERALL  XR) 5 MG 24 hr capsule Take 1 capsule (5 mg total) by mouth daily. 90 capsule 0   buPROPion  (WELLBUTRIN  SR) 150 MG 12 hr tablet Take 1 tablet (150 mg total) by mouth 2 (two) times daily. 180 tablet 1   cholecalciferol (VITAMIN D3) 25 MCG (1000 UNIT) tablet Take 1,000 Units by mouth daily.     estradiol  (ESTRACE ) 0.1 MG/GM vaginal cream INSERT BLUEBERRY SIZE AMOUNT OF CREAM INTO VAGINA AT BEDTIME TWICE/WEEK 42.5 g 2   estradiol -norethindrone  (ACTIVELLA ) 1-0.5 MG tablet Take 1 tablet by mouth daily. 84 tablet 3   eszopiclone  (LUNESTA ) 2 MG TABS tablet Take 1 tablet (2 mg total) by mouth at bedtime as needed for sleep. Take immediately before bedtime 90 tablet 0   metroNIDAZOLE  (METROGEL ) 0.75 % vaginal gel Place 1 Application vaginally at bedtime for 5 days. 70 g 3   sulfamethoxazole -trimethoprim  (BACTRIM  DS) 800-160 MG tablet Take 1 tablet by mouth in the morning and evening for 10 days. 20 tablet 0   trimethoprim  (TRIMPEX ) 100 MG tablet Take 1 tablet (100 mg total)  by mouth daily. 90 tablet 3   ondansetron  (ZOFRAN -ODT) 4 MG disintegrating tablet Dissolve 1 tablet (4 mg total) in mouth every 8 (eight) hours as needed for nausea or vomiting. 30 tablet 3   SUMAtriptan  (IMITREX ) 50 MG tablet Take 1-2 tablets (50-100 mg total) by mouth as needed for migraine. May repeat in 2 hours if headache persists or recurs. 30 tablet 3   topiramate  (TOPAMAX ) 25 MG tablet Take 1 tablet (25 mg total) by mouth daily. 90 tablet 3   No current facility-administered medications for this visit.    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       Hobbies: tennis, time with family   Objective  Objective:  BP 102/70 (BP Location: Left Arm, Patient Position: Sitting, Cuff Size: Normal)   Pulse 87   Temp 98.4 F (36.9 C) (Temporal)   Ht 5' 4.5 (1.638 m)   Wt 118 lb (53.5 kg)   LMP 07/30/2015 (Approximate)   SpO2 99%   BMI 19.94 kg/m  Gen: NAD, resting comfortably HEENT: Mucous membranes are moist. Oropharynx normal Neck: no thyromegaly CV: RRR no murmurs rubs or gallops Lungs: CTAB no crackles, wheeze, rhonchi Abdomen: soft/nontender/nondistended/normal bowel sounds. No rebound or guarding.  Ext: no edema Skin: warm, dry Neuro: grossly normal, moves all extremities, PERRLA   Assessment and Plan   58 y.o. female presenting for annual physical.  Health Maintenance counseling: 1. Anticipatory guidance: Patient counseled regarding regular dental exams -q6 months, eye exams - considering update,  avoiding smoking and second hand smoke , limiting alcohol to 1 beverage per day- 6 a month , no illicit drugs .   2. Risk factor reduction:  Advised patient of need for regular exercise and diet rich and fruits and vegetables to reduce risk of heart attack and stroke.  Exercise- hiking and pickleball plus bike plus 2.5 miles walking in hospital when on.  Diet/weight management-very healthy  weightrange- vegetarian with occasional dairy.  Wt Readings from Last 3 Encounters:  12/06/23 118 lb (53.5 kg)  09/30/23 115 lb 9.6 oz (52.4 kg)  11/12/22 121 lb 3.2 oz (55 kg)  3. Immunizations/screenings/ancillary studies-flu shot today, holding off on Prevnar 20  Immunization History  Administered Date(s) Administered   Influenza, Seasonal, Injecte, Preservative Fre 11/12/2022, 12/06/2023    Influenza,inj,Quad PF,6+ Mos 11/30/2013, 11/22/2018, 12/15/2021   Influenza-Unspecified 11/20/2016   PFIZER(Purple Top)SARS-COV-2 Vaccination 02/06/2019, 02/27/2019   Tdap 03/05/2017   Zoster Recombinant(Shingrix ) 07/02/2022, 11/12/2022  4. Cervical cancer screening- November 06, 2019 on file and HPV negative so technically good for 5 years and sees GYN regularly Dr. Evangeline 5. Breast cancer screening-  breast exam with GYN and mammogram 07/31/2023 6. Colon cancer screening - March 10, 2021 with [facility] Dr. Burnette with history of sessile serrated polyps-7-year repeat  7. Skin cancer screening-no dermatologist. advised regular sunscreen use. Denies worrisome, changing, or new skin lesions.  8. Birth control/STD check- long-term dating  and monogamous 10 declines STD screening.  Replacement through GYN  9. Osteoporosis screening at 28- osteopenia previously noted by GYN-low risk of progression on HRT 10. Smoking associated screening -never smoker  Status of chronic or acute concerns   #Chronic cystitis-  Possible UTI at last visit but urine culture showed no infection.  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  Also has nitrofurantoin  for after intercourse.  She did end up getting treated with Bactrim  double strength about 11 days later.  She is also seeing Dr. McDiarmid who placed her on daily trimethoprim . She is not perfect yet but wants to hold off on cystoscopy  # Migraines-last visit was having  increase- propranolol  became ineffective and we opted to trial Topamax  at 25 mg and refill sumatriptan  at last visit as well as ondansetron  for nausea element -  she's having about 2 migraines a week but less severe and medications work- more tolerable -has decreased appetite and lost weight- has been able to regain but may reach out about increasing dose -with weight loss was cautious and had labs at [facility] 10/22/23 with completely normal CBC and CMP.  TSH mildly high- opts out of rechecking  # Depression/ADD-on Wellbutrin  150 mg extended release through psychiatry along with Adderall  5 mg extended release and also on Lunesta  for sleep  #Vitamin D concern S: Medication: 1000 units daily A/P: check vitamin D with labs for baseline- high risk for lows   Recommended follow up: Return in about 1 year (around 12/05/2024) for physical or sooner if needed.Schedule b4 you leave. Future Appointments  Date Time Provider Department Center  12/06/2023  2:30 PM Mozingo, Regina Nattalie, NP CP-CP None   Lab/Order associations: fasting   ICD-10-CM   1. Preventative health care  Z00.00     2. Immunization due  Z23 Flu vaccine trivalent PF, 6mos and older(Flulaval,Afluria,Fluarix,Fluzone)    3. Encounter for vitamin deficiency screening  Z13.21 VITAMIN D 25 Hydroxy (Vit-D Deficiency, Fractures)    4. Screening for hyperlipidemia  Z13.220 Lipid panel    5. Screening for deficiency anemia  Z13.0       Meds ordered this encounter  Medications   topiramate  (TOPAMAX ) 25 MG tablet    Sig: Take 1 tablet (25 mg total) by mouth daily.    Dispense:  90 tablet    Refill:  3   SUMAtriptan  (IMITREX ) 50 MG tablet    Sig: Take 1-2 tablets (50-100 mg total) by mouth as needed for migraine. May repeat in 2 hours if headache persists or recurs.    Dispense:  30 tablet    Refill:  3   ondansetron  (ZOFRAN -ODT) 4 MG disintegrating tablet    Sig: Dissolve 1 tablet (4 mg total) in mouth every 8 (eight) hours as needed for nausea or vomiting.    Dispense:  30 tablet    Refill:  3    Return precautions advised.  Garnette Lukes, MD

## 2023-12-07 ENCOUNTER — Other Ambulatory Visit (HOSPITAL_COMMUNITY): Payer: Self-pay

## 2023-12-08 DIAGNOSIS — N3021 Other chronic cystitis with hematuria: Secondary | ICD-10-CM | POA: Diagnosis not present

## 2023-12-08 DIAGNOSIS — R309 Painful micturition, unspecified: Secondary | ICD-10-CM | POA: Diagnosis not present

## 2023-12-13 ENCOUNTER — Telehealth: Payer: Self-pay | Admitting: Family Medicine

## 2023-12-13 NOTE — Telephone Encounter (Signed)
 Records faxed 12/13/2023.   Copied from CRM [id number]. Topic: Medical Record Request - Other >> Dec 10, 2023  1:34 PM Suzen RAMAN wrote: Reason for CRM: Patient would like a copy of her flu shot that was administer on 12/06/23 faxed to her employer( ([employer] work) at [phone]

## 2024-01-01 ENCOUNTER — Encounter: Payer: Self-pay | Admitting: Family Medicine

## 2024-01-03 ENCOUNTER — Other Ambulatory Visit (HOSPITAL_COMMUNITY): Payer: Self-pay

## 2024-01-03 MED ORDER — TOPIRAMATE 50 MG PO TABS
25.0000 mg | ORAL_TABLET | Freq: Every day | ORAL | 3 refills | Status: DC
  Filled 2024-01-03: qty 45, 90d supply, fill #0

## 2024-01-04 DIAGNOSIS — R351 Nocturia: Secondary | ICD-10-CM | POA: Diagnosis not present

## 2024-01-04 DIAGNOSIS — R310 Gross hematuria: Secondary | ICD-10-CM | POA: Diagnosis not present

## 2024-01-04 DIAGNOSIS — N390 Urinary tract infection, site not specified: Secondary | ICD-10-CM | POA: Diagnosis not present

## 2024-01-04 DIAGNOSIS — R350 Frequency of micturition: Secondary | ICD-10-CM | POA: Diagnosis not present

## 2024-01-10 ENCOUNTER — Other Ambulatory Visit: Payer: Self-pay

## 2024-01-10 ENCOUNTER — Other Ambulatory Visit (HOSPITAL_COMMUNITY): Payer: Self-pay

## 2024-01-11 ENCOUNTER — Other Ambulatory Visit (HOSPITAL_COMMUNITY): Payer: Self-pay

## 2024-02-01 DIAGNOSIS — R310 Gross hematuria: Secondary | ICD-10-CM | POA: Diagnosis not present

## 2024-02-02 ENCOUNTER — Other Ambulatory Visit (HOSPITAL_COMMUNITY): Payer: Self-pay

## 2024-02-03 ENCOUNTER — Other Ambulatory Visit: Payer: Self-pay

## 2024-02-03 ENCOUNTER — Telehealth: Payer: Self-pay | Admitting: Family Medicine

## 2024-02-03 MED ORDER — TOPIRAMATE 50 MG PO TABS
25.0000 mg | ORAL_TABLET | Freq: Every day | ORAL | 3 refills | Status: DC
  Filled 2024-02-03: qty 90, 180d supply, fill #0

## 2024-02-03 NOTE — Telephone Encounter (Signed)
 50mg  refill sent to [facility] .   Copied from CRM #[id number]. Topic: Clinical - Prescription Issue >> Feb 03, 2024  9:23 AM Anairis L wrote: Reason for CRM: Pt is calling in because Dr. Katrinka started her with 25 mg for her headache in November. After a while she up to topiramate  (TOPAMAX ) 50 MG tablet (Headache are 100% better), but script was never updated so she is running out of medication.  Please advise.  She has 15 days left.

## 2024-02-11 ENCOUNTER — Other Ambulatory Visit (HOSPITAL_COMMUNITY): Payer: Self-pay

## 2024-02-11 ENCOUNTER — Telehealth: Payer: Self-pay

## 2024-02-11 ENCOUNTER — Encounter: Payer: Self-pay | Admitting: Family Medicine

## 2024-02-11 NOTE — Telephone Encounter (Signed)
 Updated prescription was sent in 12/18

## 2024-02-14 ENCOUNTER — Other Ambulatory Visit: Payer: Self-pay | Admitting: Family Medicine

## 2024-02-14 ENCOUNTER — Other Ambulatory Visit (HOSPITAL_COMMUNITY): Payer: Self-pay

## 2024-02-14 MED ORDER — TOPIRAMATE 50 MG PO TABS
50.0000 mg | ORAL_TABLET | Freq: Every day | ORAL | 3 refills | Status: AC
  Filled 2024-02-14 – 2024-02-22 (×3): qty 90, 90d supply, fill #0

## 2024-02-18 ENCOUNTER — Encounter (HOSPITAL_COMMUNITY): Payer: Self-pay

## 2024-02-18 ENCOUNTER — Other Ambulatory Visit (HOSPITAL_COMMUNITY): Payer: Self-pay

## 2024-02-22 ENCOUNTER — Other Ambulatory Visit (HOSPITAL_COMMUNITY): Payer: Self-pay

## 2024-03-01 ENCOUNTER — Other Ambulatory Visit (HOSPITAL_COMMUNITY): Payer: Self-pay

## 2024-03-01 ENCOUNTER — Telehealth: Payer: Self-pay | Admitting: Adult Health

## 2024-03-01 ENCOUNTER — Other Ambulatory Visit: Payer: Self-pay

## 2024-03-01 MED ORDER — TRIMETHOPRIM 100 MG PO TABS
100.0000 mg | ORAL_TABLET | Freq: Every day | ORAL | 3 refills | Status: AC
  Filled 2024-03-01: qty 90, 90d supply, fill #0

## 2024-03-01 NOTE — Telephone Encounter (Signed)
 LVM to [facility]

## 2024-03-01 NOTE — Telephone Encounter (Signed)
 Pt made app for 1/27   Wants to go back on Lunesta  and Lexapro    [facility]

## 2024-03-01 NOTE — Telephone Encounter (Signed)
 Pt said she is on Topamax  for HA and it is working well for the HA, but she isn't sleeping well and her mood isn't as good as when she was on Lexapro . She is asking to start back on both medications. She has an appt 1/27 and said she could wait to discuss with provider at that time.   Pharmacy - [facility].

## 2024-03-02 ENCOUNTER — Other Ambulatory Visit: Payer: Self-pay

## 2024-03-02 ENCOUNTER — Other Ambulatory Visit (HOSPITAL_COMMUNITY): Payer: Self-pay

## 2024-03-02 DIAGNOSIS — G4700 Insomnia, unspecified: Secondary | ICD-10-CM

## 2024-03-02 MED ORDER — ESZOPICLONE 2 MG PO TABS
2.0000 mg | ORAL_TABLET | Freq: Every evening | ORAL | 1 refills | Status: AC | PRN
  Filled 2024-03-02: qty 30, 30d supply, fill #0

## 2024-03-02 MED ORDER — ESCITALOPRAM OXALATE 10 MG PO TABS
10.0000 mg | ORAL_TABLET | Freq: Every day | ORAL | 1 refills | Status: AC
  Filled 2024-03-02: qty 30, 30d supply, fill #0

## 2024-03-02 NOTE — Telephone Encounter (Signed)
 Pended Lunesta , sent Lexapro .

## 2024-03-14 ENCOUNTER — Encounter: Payer: Self-pay | Admitting: Adult Health

## 2024-03-14 ENCOUNTER — Telehealth: Admitting: Adult Health

## 2024-03-14 DIAGNOSIS — F329 Major depressive disorder, single episode, unspecified: Secondary | ICD-10-CM | POA: Diagnosis not present

## 2024-03-14 DIAGNOSIS — F331 Major depressive disorder, recurrent, moderate: Secondary | ICD-10-CM

## 2024-03-14 DIAGNOSIS — G4700 Insomnia, unspecified: Secondary | ICD-10-CM

## 2024-03-14 NOTE — Progress Notes (Signed)
 MARYETTA SHAFER [id number] 1965-07-08 59 y.o.  Virtual Visit via Video Note  I connected with pt @ on 03/14/24 at  3:00 PM EST by a video enabled telemedicine application and verified that I am speaking with the correct person using two identifiers.   I discussed the limitations of evaluation and management by telemedicine and the availability of in person appointments. The patient expressed understanding and agreed to proceed.  I discussed the assessment and treatment plan with the patient. The patient was provided an opportunity to ask questions and all were answered. The patient agreed with the plan and demonstrated an understanding of the instructions.   The patient was advised to call back or seek an in-person evaluation if the symptoms worsen or if the condition fails to improve as anticipated.  I provided 15 minutes of non-face-to-face time during this encounter.  The patient was located at home.  The provider was located at [facility].   Angeline LOISE Sayers, NP   Subjective:   Patient ID:  MAKENAH KARAS is a 59 y.o. (DOB [birth year]) female.  Chief Complaint: No chief complaint on file.   HPI SHANORA CHRISTENSEN presents for follow-up of MDD and insomnia.  Describes mood today as ok. Pleasant. Denies tearfulness. Mood symptoms - denies depression - feeling a little down since starting Topamax . Reports adding Lexapro  - that has helped. Denies anxiety and irritability. Reports stable interest and motivation. Denies panic attacks. Denies worry, rumination and over thinking. Reports mood is stable. Stating I feel like I'm doing ok. Taking medications as prescribed.  Energy levels stable. Active, has a regular exercise routine.  Enjoys some usual interests and activities. In a relationship. Has 2 children. Spending time with family. Appetite adequate. Weight stable - 118 pounds. Reports sleeping well with Lunesta . Averages 6 hours a night. Focus and concentration  stable. Completing tasks. Managing aspects of household. Work going well. Denies SI or HI.  Denies AH or VH. Denies self harm. Denies substance use.  Previous medication trials: Provigil  Review of Systems:  Review of Systems  Musculoskeletal:  Negative for gait problem.  Neurological:  Negative for tremors.  Psychiatric/Behavioral:         Please refer to HPI    Medications: I have reviewed the patient's current medications.  Current Outpatient Medications  Medication Sig Dispense Refill   amphetamine -dextroamphetamine  (ADDERALL  XR) 5 MG 24 hr capsule Take 1 capsule (5 mg total) by mouth daily. 90 capsule 0   buPROPion  (WELLBUTRIN  SR) 150 MG 12 hr tablet Take 1 tablet (150 mg total) by mouth daily. 90 tablet 3   cholecalciferol (VITAMIN D3) 25 MCG (1000 UNIT) tablet Take 1,000 Units by mouth daily.     escitalopram  (LEXAPRO ) 10 MG tablet Take 1 tablet (10 mg total) by mouth daily. 30 tablet 1   estradiol  (ESTRACE ) 0.1 MG/GM vaginal cream INSERT BLUEBERRY SIZE AMOUNT OF CREAM INTO VAGINA AT BEDTIME TWICE/WEEK 42.5 g 2   estradiol -norethindrone  (ACTIVELLA ) 1-0.5 MG tablet Take 1 tablet by mouth daily. 84 tablet 3   eszopiclone  (LUNESTA ) 2 MG TABS tablet Take 1 tablet (2 mg total) by mouth at bedtime as needed for sleep. Take immediately before bedtime 30 tablet 1   metroNIDAZOLE  (METROGEL ) 0.75 % vaginal gel Place 1 Application vaginally at bedtime for 5 days. 70 g 3   ondansetron  (ZOFRAN -ODT) 4 MG disintegrating tablet Dissolve 1 tablet (4 mg total) in mouth every 8 (eight) hours as needed for nausea or vomiting. 30 tablet 3  SUMAtriptan  (IMITREX ) 50 MG tablet Take 1-2 tablets (50-100 mg total) by mouth as needed for migraine. May repeat in 2 hours if headache persists or recurs. 30 tablet 3   topiramate  (TOPAMAX ) 50 MG tablet Take 1 tablet (50 mg total) by mouth daily. 90 tablet 3   traZODone  (DESYREL ) 50 MG tablet Take 1 tablet (50 mg total) by mouth at bedtime. 90 tablet 3    trimethoprim  (TRIMPEX ) 100 MG tablet Take 1 tablet (100 mg total) by mouth daily. 90 tablet 3   trimethoprim  (TRIMPEX ) 100 MG tablet Take 1 tablet (100 mg total) by mouth daily. 90 tablet 3   No current facility-administered medications for this visit.    Medication Side Effects: None  Allergies: Allergies[1]  Past Medical History:  Diagnosis Date   Cough 08/26/2014   GAD (generalized anxiety disorder)    Major depressive disorder, single episode, severe, without mention of psychotic behavior    OCD (obsessive compulsive disorder)    Urinary tract infection, site not specified    chronic since childhood. Has had multiple eval: cysto x 3 all with chronic inflammation with lymphocytis invasion with plasma cells. for suppresion    Family History  Problem Relation Age of Onset   Atrial fibrillation Mother    Hyperlipidemia Mother    Hypothyroidism Mother    Coronary artery disease Father        early 13s, nonsmoker   Hyperlipidemia Father    Colon polyps Father    Lymphoma Father        age 60- in spine but broke his back- treated and doing better   Hypertension Brother    Heart disease Other        dads side   Heart disease Other        dads side   Lung cancer Other    Cancer Other        breast   Cancer Paternal Grandfather    Colon cancer Neg Hx    Breast cancer Neg Hx     Social History   Socioeconomic History   Marital status: Married    Spouse name: Not on file   Number of children: 2   Years of education: 18   Highest education level: Not on file  Occupational History   Occupation: NURSE PRACTITIONER    Employer: [employer]    Comment: Adult Nurse GI   Occupation: NURSE PRACTITIONER    Employer: [employer]  Tobacco Use   Smoking status: Never   Smokeless tobacco: Never  Substance and Sexual Activity   Alcohol use: Yes    Alcohol/week: 0.0 - 1.0 standard drinks of alcohol   Drug use: No   Sexual activity: Yes    Partners: Male  Other Topics  Concern   Not on file  Social History Narrative   Live with her 2 kids   1 son - '05; 1 daughter  - '03 (off at college in fall 2021)   Divorced    Prior Married '94 - 4 years, Divorced;remarried  '02 later divorced 2016 (ex took it well and still- good relationship with kids)      [place] B[employer], [employer]  MSN, [name]       Hobbies: tennis, time with family   Social Drivers of Health   Tobacco Use: Low Risk (12/06/2023)   Patient History    Smoking Tobacco Use: Never    Smokeless Tobacco Use: Never    Passive Exposure: Not on file  Financial Resource  Strain: Not on file  Food Insecurity: Not on file  Transportation Needs: Not on file  Physical Activity: Not on file  Stress: Not on file  Social Connections: Not on file  Intimate Partner Violence: Not on file  Depression (PHQ2-9): Low Risk (12/06/2023)   Depression (PHQ2-9)    PHQ-2 Score: 2  Alcohol Screen: Not on file  Housing: Not on file  Utilities: Not on file  Health Literacy: Not on file    Past Medical History, Surgical history, Social history, and Family history were reviewed and updated as appropriate.   Please see review of systems for further details on the patient's review from today.   Objective:   Physical Exam:  LMP 07/30/2015   Physical Exam Constitutional:      General: She is not in acute distress. Musculoskeletal:        General: No deformity.  Neurological:     Mental Status: She is alert and oriented to person, place, and time.     Coordination: Coordination normal.  Psychiatric:        Attention and Perception: Attention and perception normal. She does not perceive auditory or visual hallucinations.        Mood and Affect: Mood normal. Mood is not anxious or depressed. Affect is not labile, blunt, angry or inappropriate.        Speech: Speech normal.        Behavior: Behavior normal.        Thought Content: Thought content normal. Thought content is not paranoid or delusional.  Thought content does not include homicidal or suicidal ideation. Thought content does not include homicidal or suicidal plan.        Cognition and Memory: Cognition and memory normal.        Judgment: Judgment normal.     Comments: Insight intact     Lab Review:     Component Value Date/Time   NA 143 11/22/2018 1553   K 4.3 11/22/2018 1553   CL 105 11/22/2018 1553   CO2 27 11/22/2018 1553   GLUCOSE 93 11/22/2018 1553   BUN 9 11/22/2018 1553   CREATININE 0.65 11/22/2018 1553   CALCIUM 9.7 11/22/2018 1553   PROT 6.8 11/22/2018 1553   ALBUMIN 4.4 11/22/2018 1553   AST 12 11/22/2018 1553   ALT 8 11/22/2018 1553   ALKPHOS 61 11/22/2018 1553   BILITOT 0.5 11/22/2018 1553   GFRNONAA >60 08/27/2014 2240   GFRAA >60 08/27/2014 2240       Component Value Date/Time   WBC 5.1 11/22/2018 1553   RBC 4.27 11/22/2018 1553   HGB 13.4 11/22/2018 1553   HCT 39.9 11/22/2018 1553   PLT 301.0 11/22/2018 1553   MCV 93.5 11/22/2018 1553   MCH 28.8 08/27/2014 2240   MCHC 33.5 11/22/2018 1553   RDW 12.8 11/22/2018 1553   LYMPHSABS 2.0 08/13/2015 1027   MONOABS 0.4 08/13/2015 1027   EOSABS 0.1 08/13/2015 1027   BASOSABS 0.0 08/13/2015 1027    No results found for: POCLITH, LITHIUM   No results found for: PHENYTOIN, PHENOBARB, VALPROATE, CBMZ   .res Assessment: Plan:    Plan:  PDMP reviewed  Adderall  XR 5mg  capsule daily - not taking every day Wellbutrin  SR 150mg  daily Lexapro  10mg  daily  Lunesta  2mg  at hs  D/C Trazadone 50mg  at hs  BP WNL per patient  RTC 6 months  15 minutes spent dedicated to the care of this patient on the date of this encounter to include pre-visit  review of records, ordering of medication, post visit documentation, and face-to-face time with the patient discussing MDD and insomnia.   Patient advised to contact office with any questions, adverse effects, or acute worsening in signs and symptoms.  Discussed potential benefits, risks, and  side effects of stimulants with patient to include increased heart rate, palpitations, insomnia, increased anxiety, increased irritability, or decreased appetite.  Instructed patient to contact office if experiencing any significant tolerability issues.   There are no diagnoses linked to this encounter.   Please see After Visit Summary for patient specific instructions.  Future Appointments  Date Time Provider Department Center  03/14/2024  3:00 PM Walda Hertzog Nattalie, NP CP-CP None    No orders of the defined types were placed in this encounter.     -------------------------------     [1]  Allergies Allergen Reactions   Ambien  [Zolpidem  Tartrate]     Patient states this is NOT an allergy she just doesn't like taking the medication
